# Patient Record
Sex: Male | Born: 1940
Health system: Southern US, Community
[De-identification: ages and names within clinical notes are randomized; demographics above are authoritative.]

## PROBLEM LIST (undated history)

## (undated) DIAGNOSIS — F419 Anxiety disorder, unspecified: Secondary | ICD-10-CM

## (undated) DIAGNOSIS — F32A Depression, unspecified: Secondary | ICD-10-CM

## (undated) DIAGNOSIS — F411 Generalized anxiety disorder: Secondary | ICD-10-CM

## (undated) DIAGNOSIS — F329 Major depressive disorder, single episode, unspecified: Secondary | ICD-10-CM

## (undated) DIAGNOSIS — K2270 Barrett's esophagus without dysplasia: Secondary | ICD-10-CM

## (undated) DIAGNOSIS — T7840XA Allergy, unspecified, initial encounter: Secondary | ICD-10-CM

## (undated) DIAGNOSIS — E291 Testicular hypofunction: Secondary | ICD-10-CM

## (undated) DIAGNOSIS — E785 Hyperlipidemia, unspecified: Secondary | ICD-10-CM

## (undated) DIAGNOSIS — I10 Essential (primary) hypertension: Secondary | ICD-10-CM

## (undated) DIAGNOSIS — D126 Benign neoplasm of colon, unspecified: Secondary | ICD-10-CM

## (undated) DIAGNOSIS — L719 Rosacea, unspecified: Secondary | ICD-10-CM

## (undated) DIAGNOSIS — J309 Allergic rhinitis, unspecified: Secondary | ICD-10-CM

## (undated) DIAGNOSIS — N390 Urinary tract infection, site not specified: Secondary | ICD-10-CM

## (undated) DIAGNOSIS — H269 Unspecified cataract: Secondary | ICD-10-CM

## (undated) DIAGNOSIS — I4891 Unspecified atrial fibrillation: Secondary | ICD-10-CM

## (undated) DIAGNOSIS — N400 Enlarged prostate without lower urinary tract symptoms: Secondary | ICD-10-CM

## (undated) DIAGNOSIS — R3129 Other microscopic hematuria: Secondary | ICD-10-CM

## (undated) DIAGNOSIS — K219 Gastro-esophageal reflux disease without esophagitis: Secondary | ICD-10-CM

## (undated) DIAGNOSIS — C801 Malignant (primary) neoplasm, unspecified: Secondary | ICD-10-CM

## (undated) DIAGNOSIS — G709 Myoneural disorder, unspecified: Secondary | ICD-10-CM

## (undated) DIAGNOSIS — F3289 Other specified depressive episodes: Secondary | ICD-10-CM

## (undated) HISTORY — DX: Other specified depressive episodes: F32.89

## (undated) HISTORY — DX: Unspecified cataract: H26.9

## (undated) HISTORY — PX: POLYPECTOMY: SHX149

## (undated) HISTORY — DX: Benign neoplasm of colon, unspecified: D12.6

## (undated) HISTORY — DX: Other microscopic hematuria: R31.29

## (undated) HISTORY — DX: Barrett's esophagus without dysplasia: K22.70

## (undated) HISTORY — DX: Anxiety disorder, unspecified: F41.9

## (undated) HISTORY — DX: Depression, unspecified: F32.A

## (undated) HISTORY — PX: UPPER GASTROINTESTINAL ENDOSCOPY: SHX188

## (undated) HISTORY — DX: Major depressive disorder, single episode, unspecified: F32.9

## (undated) HISTORY — DX: Gastro-esophageal reflux disease without esophagitis: K21.9

## (undated) HISTORY — DX: Allergic rhinitis, unspecified: J30.9

## (undated) HISTORY — DX: Hyperlipidemia, unspecified: E78.5

## (undated) HISTORY — DX: Testicular hypofunction: E29.1

## (undated) HISTORY — DX: Myoneural disorder, unspecified: G70.9

## (undated) HISTORY — DX: Malignant (primary) neoplasm, unspecified: C80.1

## (undated) HISTORY — PX: HAND SURGERY: SHX662

## (undated) HISTORY — DX: Generalized anxiety disorder: F41.1

## (undated) HISTORY — DX: Rosacea, unspecified: L71.9

## (undated) HISTORY — DX: Allergy, unspecified, initial encounter: T78.40XA

## (undated) HISTORY — PX: MELANOMA EXCISION: SHX5266

## (undated) HISTORY — DX: Urinary tract infection, site not specified: N39.0

## (undated) HISTORY — DX: Unspecified atrial fibrillation: I48.91

## (undated) HISTORY — DX: Benign prostatic hyperplasia without lower urinary tract symptoms: N40.0

## (undated) HISTORY — DX: Essential (primary) hypertension: I10

---

## 1998-12-11 ENCOUNTER — Ambulatory Visit (HOSPITAL_COMMUNITY): Admission: RE | Admit: 1998-12-11 | Discharge: 1998-12-11 | Payer: Self-pay | Admitting: Internal Medicine

## 1998-12-11 ENCOUNTER — Encounter: Payer: Self-pay | Admitting: Internal Medicine

## 1999-07-03 ENCOUNTER — Ambulatory Visit: Admission: RE | Admit: 1999-07-03 | Discharge: 1999-07-03 | Payer: Self-pay | Admitting: Internal Medicine

## 2004-05-06 ENCOUNTER — Ambulatory Visit: Payer: Self-pay | Admitting: Psychology

## 2004-09-04 ENCOUNTER — Ambulatory Visit: Payer: Self-pay | Admitting: Internal Medicine

## 2004-09-09 ENCOUNTER — Ambulatory Visit: Payer: Self-pay | Admitting: Internal Medicine

## 2005-01-30 ENCOUNTER — Ambulatory Visit: Payer: Self-pay | Admitting: Gastroenterology

## 2005-03-06 ENCOUNTER — Ambulatory Visit: Payer: Self-pay | Admitting: Internal Medicine

## 2005-03-12 ENCOUNTER — Ambulatory Visit: Payer: Self-pay | Admitting: Internal Medicine

## 2005-03-20 ENCOUNTER — Ambulatory Visit: Payer: Self-pay | Admitting: Gastroenterology

## 2005-04-03 ENCOUNTER — Encounter (INDEPENDENT_AMBULATORY_CARE_PROVIDER_SITE_OTHER): Payer: Self-pay | Admitting: *Deleted

## 2005-04-03 ENCOUNTER — Ambulatory Visit: Payer: Self-pay | Admitting: Gastroenterology

## 2005-10-27 ENCOUNTER — Ambulatory Visit: Payer: Self-pay | Admitting: Internal Medicine

## 2005-10-30 ENCOUNTER — Ambulatory Visit: Payer: Self-pay | Admitting: Internal Medicine

## 2006-02-23 ENCOUNTER — Ambulatory Visit: Payer: Self-pay | Admitting: Internal Medicine

## 2006-02-26 ENCOUNTER — Ambulatory Visit: Payer: Self-pay | Admitting: Internal Medicine

## 2006-06-03 ENCOUNTER — Ambulatory Visit: Payer: Self-pay | Admitting: Internal Medicine

## 2006-07-07 ENCOUNTER — Ambulatory Visit: Payer: Self-pay | Admitting: Internal Medicine

## 2007-03-30 ENCOUNTER — Encounter: Payer: Self-pay | Admitting: *Deleted

## 2007-03-30 DIAGNOSIS — K2270 Barrett's esophagus without dysplasia: Secondary | ICD-10-CM

## 2007-03-30 DIAGNOSIS — F4323 Adjustment disorder with mixed anxiety and depressed mood: Secondary | ICD-10-CM | POA: Insufficient documentation

## 2007-03-30 DIAGNOSIS — J309 Allergic rhinitis, unspecified: Secondary | ICD-10-CM | POA: Insufficient documentation

## 2007-03-30 DIAGNOSIS — D126 Benign neoplasm of colon, unspecified: Secondary | ICD-10-CM | POA: Insufficient documentation

## 2007-03-30 DIAGNOSIS — L719 Rosacea, unspecified: Secondary | ICD-10-CM

## 2007-03-30 DIAGNOSIS — E291 Testicular hypofunction: Secondary | ICD-10-CM | POA: Insufficient documentation

## 2007-03-30 DIAGNOSIS — K219 Gastro-esophageal reflux disease without esophagitis: Secondary | ICD-10-CM | POA: Insufficient documentation

## 2007-03-30 DIAGNOSIS — E785 Hyperlipidemia, unspecified: Secondary | ICD-10-CM | POA: Insufficient documentation

## 2007-04-19 ENCOUNTER — Ambulatory Visit: Payer: Self-pay | Admitting: Internal Medicine

## 2007-04-19 LAB — CONVERTED CEMR LAB
ALT: 25 units/L (ref 0–53)
AST: 23 units/L (ref 0–37)
Albumin: 3.8 g/dL (ref 3.5–5.2)
Alkaline Phosphatase: 65 units/L (ref 39–117)
BUN: 11 mg/dL (ref 6–23)
Basophils Absolute: 0 10*3/uL (ref 0.0–0.1)
Bilirubin Urine: NEGATIVE
Bilirubin, Direct: 0.1 mg/dL (ref 0.0–0.3)
Calcium: 9.1 mg/dL (ref 8.4–10.5)
Chloride: 108 meq/L (ref 96–112)
Cholesterol: 193 mg/dL (ref 0–200)
Eosinophils Absolute: 0.2 10*3/uL (ref 0.0–0.6)
Eosinophils Relative: 2.3 % (ref 0.0–5.0)
GFR calc Af Amer: 96 mL/min
GFR calc non Af Amer: 80 mL/min
Glucose, Bld: 104 mg/dL — ABNORMAL HIGH (ref 70–99)
Ketones, ur: NEGATIVE mg/dL
LDL Cholesterol: 122 mg/dL — ABNORMAL HIGH (ref 0–99)
Lymphocytes Relative: 26.8 % (ref 12.0–46.0)
MCV: 89.3 fL (ref 78.0–100.0)
Monocytes Relative: 8.8 % (ref 3.0–11.0)
Neutro Abs: 4.4 10*3/uL (ref 1.4–7.7)
Nitrite: NEGATIVE
Platelets: 303 10*3/uL (ref 150–400)
RBC: 4.71 M/uL (ref 4.22–5.81)
Total CHOL/HDL Ratio: 4
Triglycerides: 109 mg/dL (ref 0–149)
Urine Glucose: NEGATIVE mg/dL
WBC: 7.1 10*3/uL (ref 4.5–10.5)

## 2007-04-25 ENCOUNTER — Ambulatory Visit: Payer: Self-pay | Admitting: Internal Medicine

## 2007-04-25 DIAGNOSIS — N400 Enlarged prostate without lower urinary tract symptoms: Secondary | ICD-10-CM | POA: Insufficient documentation

## 2007-04-26 ENCOUNTER — Telehealth: Payer: Self-pay | Admitting: Internal Medicine

## 2007-05-26 DIAGNOSIS — N390 Urinary tract infection, site not specified: Secondary | ICD-10-CM

## 2007-05-26 HISTORY — DX: Urinary tract infection, site not specified: N39.0

## 2007-10-24 ENCOUNTER — Ambulatory Visit: Payer: Self-pay | Admitting: Internal Medicine

## 2007-10-24 DIAGNOSIS — N529 Male erectile dysfunction, unspecified: Secondary | ICD-10-CM | POA: Insufficient documentation

## 2007-11-11 ENCOUNTER — Ambulatory Visit: Payer: Self-pay | Admitting: Internal Medicine

## 2007-11-15 LAB — CONVERTED CEMR LAB
ALT: 22 units/L (ref 0–53)
Bilirubin, Direct: 0.1 mg/dL (ref 0.0–0.3)
CO2: 30 meq/L (ref 19–32)
Calcium: 8.8 mg/dL (ref 8.4–10.5)
Cholesterol: 205 mg/dL (ref 0–200)
Creatinine, Ser: 0.9 mg/dL (ref 0.4–1.5)
Direct LDL: 137.1 mg/dL
GFR calc Af Amer: 109 mL/min
HDL: 53 mg/dL (ref >39.0)
Sodium: 142 meq/L (ref 135–145)
Total Bilirubin: 0.8 mg/dL (ref 0.3–1.2)
Total CHOL/HDL Ratio: 3.9
Total Protein: 6.8 g/dL (ref 6.0–8.3)
Triglycerides: 74 mg/dL (ref 0–149)
VLDL: 15 mg/dL (ref 0–40)

## 2008-03-27 ENCOUNTER — Telehealth: Payer: Self-pay | Admitting: Gastroenterology

## 2008-04-25 ENCOUNTER — Ambulatory Visit: Payer: Self-pay | Admitting: Internal Medicine

## 2008-04-25 DIAGNOSIS — N12 Tubulo-interstitial nephritis, not specified as acute or chronic: Secondary | ICD-10-CM | POA: Insufficient documentation

## 2008-04-30 ENCOUNTER — Ambulatory Visit: Payer: Self-pay | Admitting: Gastroenterology

## 2008-05-21 ENCOUNTER — Telehealth: Payer: Self-pay | Admitting: Internal Medicine

## 2008-06-04 ENCOUNTER — Ambulatory Visit: Payer: Self-pay | Admitting: Gastroenterology

## 2008-06-04 ENCOUNTER — Encounter: Payer: Self-pay | Admitting: Gastroenterology

## 2008-06-07 ENCOUNTER — Encounter: Admission: RE | Admit: 2008-06-07 | Discharge: 2008-06-07 | Payer: Self-pay | Admitting: Internal Medicine

## 2008-06-08 ENCOUNTER — Encounter: Payer: Self-pay | Admitting: Gastroenterology

## 2008-10-18 ENCOUNTER — Ambulatory Visit: Payer: Self-pay | Admitting: Internal Medicine

## 2008-10-18 LAB — CONVERTED CEMR LAB
ALT: 18 units/L (ref 0–53)
AST: 24 units/L (ref 0–37)
Albumin: 3.9 g/dL (ref 3.5–5.2)
Alkaline Phosphatase: 61 units/L (ref 39–117)
Basophils Relative: 0.6 % (ref 0.0–3.0)
CO2: 29 meq/L (ref 19–32)
Calcium: 9.1 mg/dL (ref 8.4–10.5)
Eosinophils Relative: 2.5 % (ref 0.0–5.0)
GFR calc non Af Amer: 79.19 mL/min (ref >60)
HDL: 61.8 mg/dL (ref >39.00)
Hemoglobin, Urine: NEGATIVE
Hemoglobin: 13.3 g/dL (ref 13.0–17.0)
Ketones, ur: NEGATIVE mg/dL
Lymphocytes Relative: 23.3 % (ref 12.0–46.0)
MCHC: 33.7 g/dL (ref 30.0–36.0)
Monocytes Relative: 7.2 % (ref 3.0–12.0)
Neutro Abs: 4.7 10*3/uL (ref 1.4–7.7)
Potassium: 4.9 meq/L (ref 3.5–5.1)
Sodium: 145 meq/L (ref 135–145)
Total Protein, Urine: NEGATIVE mg/dL
Total Protein: 6.6 g/dL (ref 6.0–8.3)
Triglycerides: 120 mg/dL (ref 0.0–149.0)
Urine Glucose: NEGATIVE mg/dL
Urobilinogen, UA: 0.2 (ref 0.0–1.0)
VLDL: 24 mg/dL (ref 0.0–40.0)

## 2008-10-23 ENCOUNTER — Ambulatory Visit: Payer: Self-pay | Admitting: Internal Medicine

## 2009-01-03 ENCOUNTER — Ambulatory Visit: Payer: Self-pay | Admitting: Internal Medicine

## 2009-03-19 ENCOUNTER — Ambulatory Visit: Payer: Self-pay | Admitting: Internal Medicine

## 2009-03-19 DIAGNOSIS — Z87891 Personal history of nicotine dependence: Secondary | ICD-10-CM | POA: Insufficient documentation

## 2009-03-21 ENCOUNTER — Ambulatory Visit: Payer: Self-pay | Admitting: Internal Medicine

## 2009-03-22 LAB — CONVERTED CEMR LAB
ALT: 21 units/L (ref 0–53)
AST: 21 units/L (ref 0–37)
Albumin: 4 g/dL (ref 3.5–5.2)
Alkaline Phosphatase: 61 units/L (ref 39–117)
BUN: 12 mg/dL (ref 6–23)
Chloride: 103 meq/L (ref 96–112)
Cholesterol: 216 mg/dL — ABNORMAL HIGH (ref 0–200)
Glucose, Bld: 94 mg/dL (ref 70–99)
Potassium: 4 meq/L (ref 3.5–5.1)
Sodium: 136 meq/L (ref 135–145)
Total Bilirubin: 0.9 mg/dL (ref 0.3–1.2)
Total CHOL/HDL Ratio: 4
VLDL: 61.2 mg/dL — ABNORMAL HIGH (ref 0.0–40.0)

## 2009-03-26 LAB — CONVERTED CEMR LAB: Vit D, 25-Hydroxy: 28 ng/mL — ABNORMAL LOW (ref 30–89)

## 2009-05-25 HISTORY — PX: PROSTATE BIOPSY: SHX241

## 2009-05-31 ENCOUNTER — Encounter: Payer: Self-pay | Admitting: Internal Medicine

## 2009-06-19 ENCOUNTER — Ambulatory Visit: Payer: Self-pay | Admitting: Internal Medicine

## 2009-06-19 DIAGNOSIS — D485 Neoplasm of uncertain behavior of skin: Secondary | ICD-10-CM

## 2009-07-10 ENCOUNTER — Telehealth: Payer: Self-pay | Admitting: Internal Medicine

## 2009-07-22 ENCOUNTER — Ambulatory Visit: Payer: Self-pay | Admitting: Internal Medicine

## 2009-07-22 DIAGNOSIS — K5909 Other constipation: Secondary | ICD-10-CM

## 2009-11-06 ENCOUNTER — Encounter: Payer: Self-pay | Admitting: Internal Medicine

## 2009-12-09 ENCOUNTER — Encounter: Payer: Self-pay | Admitting: Internal Medicine

## 2010-02-06 ENCOUNTER — Encounter (INDEPENDENT_AMBULATORY_CARE_PROVIDER_SITE_OTHER): Payer: Self-pay | Admitting: *Deleted

## 2010-02-11 ENCOUNTER — Encounter (INDEPENDENT_AMBULATORY_CARE_PROVIDER_SITE_OTHER): Payer: Self-pay | Admitting: *Deleted

## 2010-02-13 ENCOUNTER — Ambulatory Visit: Payer: Self-pay | Admitting: Internal Medicine

## 2010-02-13 LAB — CONVERTED CEMR LAB
Alkaline Phosphatase: 66 units/L (ref 39–117)
BUN: 15 mg/dL (ref 6–23)
Basophils Absolute: 0 10*3/uL (ref 0.0–0.1)
Basophils Relative: 0 % (ref 0.0–3.0)
Bilirubin, Direct: 0.1 mg/dL (ref 0.0–0.3)
CO2: 27 meq/L (ref 19–32)
Calcium: 9 mg/dL (ref 8.4–10.5)
Chloride: 101 meq/L (ref 96–112)
Cholesterol: 237 mg/dL — ABNORMAL HIGH (ref 0–200)
Creatinine, Ser: 0.8 mg/dL (ref 0.4–1.5)
Direct LDL: 163.9 mg/dL
Eosinophils Absolute: 0.2 10*3/uL (ref 0.0–0.7)
Glucose, Bld: 89 mg/dL (ref 70–99)
Hemoglobin, Urine: NEGATIVE
Lymphocytes Relative: 24.2 % (ref 12.0–46.0)
MCHC: 34.8 g/dL (ref 30.0–36.0)
MCV: 89.4 fL (ref 78.0–100.0)
Monocytes Absolute: 0.6 10*3/uL (ref 0.1–1.0)
Neutrophils Relative %: 65.5 % (ref 43.0–77.0)
Nitrite: NEGATIVE
PSA: 3.21 ng/mL (ref 0.10–4.00)
Platelets: 248 10*3/uL (ref 150.0–400.0)
RBC: 4.77 M/uL (ref 4.22–5.81)
RDW: 13.4 % (ref 11.5–14.6)
Specific Gravity, Urine: 1.015 (ref 1.000–1.030)
Total Bilirubin: 0.8 mg/dL (ref 0.3–1.2)
Total CHOL/HDL Ratio: 4
Total Protein, Urine: NEGATIVE mg/dL
Total Protein: 6.7 g/dL (ref 6.0–8.3)
Triglycerides: 153 mg/dL — ABNORMAL HIGH (ref 0.0–149.0)
Urine Glucose: NEGATIVE mg/dL

## 2010-02-19 ENCOUNTER — Ambulatory Visit: Payer: Self-pay | Admitting: Internal Medicine

## 2010-02-24 ENCOUNTER — Encounter (INDEPENDENT_AMBULATORY_CARE_PROVIDER_SITE_OTHER): Payer: Self-pay | Admitting: *Deleted

## 2010-02-26 ENCOUNTER — Ambulatory Visit: Payer: Self-pay | Admitting: Gastroenterology

## 2010-02-26 ENCOUNTER — Encounter (INDEPENDENT_AMBULATORY_CARE_PROVIDER_SITE_OTHER): Payer: Self-pay | Admitting: *Deleted

## 2010-03-10 ENCOUNTER — Telehealth: Payer: Self-pay | Admitting: Gastroenterology

## 2010-03-10 ENCOUNTER — Ambulatory Visit: Payer: Self-pay | Admitting: Gastroenterology

## 2010-04-22 ENCOUNTER — Ambulatory Visit: Payer: Self-pay | Admitting: Gastroenterology

## 2010-04-22 HISTORY — PX: COLONOSCOPY: SHX174

## 2010-04-24 ENCOUNTER — Encounter: Payer: Self-pay | Admitting: Gastroenterology

## 2010-06-18 ENCOUNTER — Ambulatory Visit
Admission: RE | Admit: 2010-06-18 | Discharge: 2010-06-18 | Payer: Self-pay | Source: Home / Self Care | Attending: Internal Medicine | Admitting: Internal Medicine

## 2010-06-18 ENCOUNTER — Other Ambulatory Visit: Payer: Self-pay | Admitting: Internal Medicine

## 2010-06-18 LAB — BASIC METABOLIC PANEL
BUN: 15 mg/dL (ref 6–23)
CO2: 29 mEq/L (ref 19–32)
Calcium: 9.3 mg/dL (ref 8.4–10.5)
Chloride: 108 mEq/L (ref 96–112)
Creatinine, Ser: 0.8 mg/dL (ref 0.4–1.5)
GFR: 96.36 mL/min (ref >60.00)
Glucose, Bld: 93 mg/dL (ref 70–99)
Potassium: 4.8 mEq/L (ref 3.5–5.1)
Sodium: 143 mEq/L (ref 135–145)

## 2010-06-18 LAB — HEPATIC FUNCTION PANEL
ALT: 26 U/L (ref 0–53)
AST: 21 U/L (ref 0–37)
Albumin: 4 g/dL (ref 3.5–5.2)
Alkaline Phosphatase: 75 U/L (ref 39–117)
Bilirubin, Direct: 0.1 mg/dL (ref 0.0–0.3)
Total Bilirubin: 0.9 mg/dL (ref 0.3–1.2)
Total Protein: 6.8 g/dL (ref 6.0–8.3)

## 2010-06-18 LAB — LIPID PANEL
Cholesterol: 203 mg/dL — ABNORMAL HIGH (ref 0–200)
HDL: 59.1 mg/dL (ref >39.00)
Total CHOL/HDL Ratio: 3
Triglycerides: 192 mg/dL — ABNORMAL HIGH (ref 0.0–149.0)
VLDL: 38.4 mg/dL (ref 0.0–40.0)

## 2010-06-18 LAB — LDL CHOLESTEROL, DIRECT: Direct LDL: 127.8 mg/dL

## 2010-06-23 ENCOUNTER — Ambulatory Visit
Admission: RE | Admit: 2010-06-23 | Discharge: 2010-06-23 | Payer: Self-pay | Source: Home / Self Care | Attending: Internal Medicine | Admitting: Internal Medicine

## 2010-06-23 DIAGNOSIS — R5383 Other fatigue: Secondary | ICD-10-CM

## 2010-06-23 DIAGNOSIS — R5381 Other malaise: Secondary | ICD-10-CM | POA: Insufficient documentation

## 2010-06-24 NOTE — Assessment & Plan Note (Signed)
Summary: PT HAS MOLE THAT HE WANTS TO BE LOOKED AT/AJ   Vital Signs:  Patient profile:   70 year old male Weight:      206 pounds Temp:     98.5 degrees F oral Pulse rate:   52 / minute BP sitting:   122 / 64  (left arm)  Vitals Entered By: Tora Perches (June 19, 2009 4:28 PM)  Procedure Note  Mole Biopsy/Removal: The patient complains of changing mole. Onset of lesion: 4 weeks Indication: changing lesion Consent signed: yes  Procedure # 1: shave biopsy    Size (in cm): 1.1 x 1.0    Region: medial    Location: upper anterior chest    Comment: Risks including but not limited by incomplete procedure, bleeding, infection, recurrence were discussed with the patient. Consent form was signed.     Instrument used: dermablade    Anesthesia: 1.0 ml 1% lidocaine w/epinephrine  Cleaned and prepped with: alcohol and betadine Wound dressing: neosporin and bandaid Instructions: daily dressing changes Additional Instructions: Tolerated well. Complicatons - none.   CC: mole in question Is Patient Diabetic? No   CC:  mole in question.  History of Present Illness: C/o mole on chest that changed lately  Preventive Screening-Counseling & Management  Alcohol-Tobacco     Smoking Status: quit < 6 months  Current Medications (verified): 1)  Lovastatin 40 Mg  Tabs (Lovastatin) .... Take 2 Tablets Daily 2)  Aspirin 81 Mg  Tabs (Aspirin) .... Take One Tablet Once Daily 3)  Vitamin D3 1000 Unit  Tabs (Cholecalciferol) 4)  Levitra 20 Mg Tabs (Vardenafil Hcl) .... 1/2-1 Once Daily As Needed 5)  Sertraline Hcl 100 Mg Tabs (Sertraline Hcl) .Marland Kitchen.. 11/2 Once Daily 6)  Abilify 10 Mg Tabs (Aripiprazole) .... Once Daily 7)  Clorazepate Dipotassium 7.5 Mg Tabs (Clorazepate Dipotassium) .... Once Daily 8)  Omeprazole 20 Mg Cpdr (Omeprazole) .... Two By Mouth Daily  Allergies: 1)  ! Vioxx  Social History: Smoking Status:  quit < 6 months  Physical Exam  General:   Well-developed,well-nourished,in no acute distress; alert,appropriate and cooperative throughout examination Skin:  11x10 mm irregular mole on chest   Impression & Recommendations:  Problem # 1:  NEOPLASM OF UNCERTAIN BEHAVIOR OF SKIN (ICD-238.2) upper anter chest Assessment New  Options discussed. He elected biopsy now.  Orders: Shave Skin Lesion 1.1-2.0 cm/trunk/arm/leg ([address])  Complete Medication List: 1)  Lovastatin 40 Mg Tabs (Lovastatin) .... Take 2 tablets daily 2)  Aspirin 81 Mg Tabs (Aspirin) .... Take one tablet once daily 3)  Vitamin D3 1000 Unit Tabs (Cholecalciferol) 4)  Levitra 20 Mg Tabs (Vardenafil hcl) .... 1/2-1 once daily as needed 5)  Sertraline Hcl 100 Mg Tabs (Sertraline hcl) .Marland Kitchen.. 11/2 once daily 6)  Abilify 10 Mg Tabs (Aripiprazole) .... Once daily 7)  Clorazepate Dipotassium 7.5 Mg Tabs (Clorazepate dipotassium) .... Once daily 8)  Omeprazole 20 Mg Cpdr (Omeprazole) .... Two by mouth daily  Patient Instructions: 1)  Keep return office visit

## 2010-06-24 NOTE — Letter (Signed)
Summary: Patient Notice-Barrett's [facility]  [address] [place], [place] [address]   Phone: [phone]  Fax: [phone]        April 24, 2010 MRN: [id number]    [facility] [address] [place], [place]  [address]    Dear Mr. Spengler,  I am pleased to inform you that the biopsies taken during your recent endoscopic examination did not show any evidence of cancer upon pathologic examination.  However, your biopsies indicate you have a condition known as Barrett's esophagus. While not cancer, it is pre-cancerous (can progress to cancer) and needs to be monitored with repeat endoscopic examination and biopsies.  Fortunately, it is quite rare that this develops into cancer, but careful monitoring of the condition along with taking your medication as prescribed is important in reducing the risk of developing cancer.  The stomach biopsies showed mild chronic gastritis.  It is my recommendation that you have a repeat upper gastrointestinal endoscopic examination in 2 years.  Continue with treatment plan as outlined the day of your exam.  Please call us if you have or develop heartburn, reflux symptoms, any swallowing problems, or if you have questions about your condition that have not been fully answered at this time.  Sincerely,  Meryl Dare MD [facility]  This letter has been electronically signed by your physician.  Appended Document: Patient Notice-Barrett's Esopghagus Letter mailed

## 2010-06-24 NOTE — Letter (Signed)
Summary: [facility]   Imported By: Sherian Rein 12/02/2009 14:18:26  _____________________________________________________________________  External Attachment:    Type:   Image     Comment:   External Document

## 2010-06-24 NOTE — Procedures (Signed)
Summary: Colonoscopy  Patient: Oscar [name] Note: All result statuses are Final unless otherwise noted.  Tests: (1) Colonoscopy (COL)   COL Colonoscopy           DONE     [facility]     [address]     [place], [place]  [address]           COLONOSCOPY PROCEDURE REPORT     PATIENT:  Oscar [name]  MR#:  [id number]     BIRTHDATE:  1940/09/24, 69 yrs. old  GENDER:  male     ENDOSCOPIST:  Judie Petit T. Russella Dar, MD, [facility]           PROCEDURE DATE:  04/22/2010     PROCEDURE:  Colonoscopy 19[address]     ASA CLASS:  Class II     INDICATIONS:  1) surveillance and high-risk screening 2) history     of adenomatous colon polyps: 1998 3) family history of colon     cancer: mother with colon cancer at 61.     MEDICATIONS:   Fentanyl 75 mcg IV, Versed 8 mg IV     DESCRIPTION OF PROCEDURE:   After the risks benefits and     alternatives of the procedure were thoroughly explained, informed     consent was obtained.  Digital rectal exam was performed and     revealed no abnormalities.   The LB [id number]-[id number] endoscope     was introduced through the anus and advanced to the cecum, which     was identified by both the appendix and ileocecal valve, without     limitations.  The quality of the prep was good, using MoviPrep.     The instrument was then slowly withdrawn as the colon was fully     examined.     <<PROCEDUREIMAGES>>     FINDINGS:  Mild diverticulosis was found in the sigmoid colon. A     normal appearing cecum, ileocecal valve, and appendiceal orifice     were identified. The ascending, hepatic flexure, transverse,     splenic flexure, descending colon, and rectum appeared     unremarkable.  Retroflexed views in the rectum revealed internal     hemorrhoids, moderate.  The time to cecum =  4.5  minutes. The     scope was then withdrawn (time =  9.67  min) from the patient and     the procedure completed.     COMPLICATIONS:  None           ENDOSCOPIC IMPRESSION:     1) Mild  diverticulosis in the sigmoid colon     2) Internal hemorrhoids     RECOMMENDATIONS:     1) High fiber diet with liberal fluid intake.     2) Repeat Colonoscopy in 5 years.           Venita Lick. Russella Dar, MD, [facility]           n.     eSIGNED:   Venita Lick. Stark at 04/22/2010 11:33 AM           [name], [phone]  Note: An exclamation mark (!) indicates a result that was not dispersed into the flowsheet. Document Creation Date: 04/22/2010 11:34 AM _______________________________________________________________________  (1) Order result status: Final Collection or observation date-time: 04/22/2010 11:28 Requested date-time:  Receipt date-time:  Reported date-time:  Referring Physician:   Ordering Physician: Claudette Head [phone]) Specimen Source:  Source: Launa Grill Order Number: [id number] Lab  site:   Appended Document: Colonoscopy    Clinical Lists Changes  Observations: Added new observation of COLONNXTDUE: 03/2015 (04/22/2010 13:47)

## 2010-06-24 NOTE — Miscellaneous (Signed)
Summary: Skin Bx/[facility]  Skin Bx/[facility]   Imported By: Sherian Rein 06/22/2009 11:39:29  _____________________________________________________________________  External Attachment:    Type:   Image     Comment:   External Document

## 2010-06-24 NOTE — Assessment & Plan Note (Signed)
Summary: 3-4 MO ROV/NWS   Vital Signs:  Patient profile:   70 year old male Weight:      204 pounds BMI:     27.01 Temp:     98.7 degrees F oral Pulse rate:   52 / minute BP sitting:   122 / 70  (left arm)  Vitals Entered By: Tora Perches (July 22, 2009 10:05 AM) CC: f/u Is Patient Diabetic? No   CC:  f/u.  History of Present Illness: C/o bad case of constipation that we treated w/Nulytely  Preventive Screening-Counseling & Management  Alcohol-Tobacco     Smoking Status: quit  Current Medications (verified): 1)  Lovastatin 40 Mg  Tabs (Lovastatin) .... Take 2 Tablets Daily 2)  Aspirin 81 Mg  Tabs (Aspirin) .... Take One Tablet Once Daily 3)  Vitamin D3 1000 Unit  Tabs (Cholecalciferol) 4)  Levitra 20 Mg Tabs (Vardenafil Hcl) .... 1/2-1 Once Daily As Needed 5)  Sertraline Hcl 100 Mg Tabs (Sertraline Hcl) .Marland Kitchen.. 11/2 Once Daily 6)  Abilify 10 Mg Tabs (Aripiprazole) .... Once Daily 7)  Omeprazole 20 Mg Cpdr (Omeprazole) .... Two By Mouth Daily 8)  Nulytely With Flavor Packs 420 Gm Solr (Peg 3350-Kcl-Na Bicarb-Nacl) .... As Dirr For Colon Prep  Allergies: 1)  ! Vioxx  Past History:  Past Medical History: Last updated: 01/03/2009 HYPOGONADISM (ICD-257.2) ANXIETY (ICD-300.00) Hx of ROSACEA (ICD-695.3) ALLERGIC RHINITIS (ICD-477.9) BARRETT'S ESOPHAGUS, HX OF (ICD-V12.79)   Dr Russella Dar GASTROESOPHAGEAL REFLUX DISEASE (ICD-530.81) ADENOMATOUS COLONIC POLYP (ICD-211.3) DYSLIPIDEMIA (ICD-272.4) DEPRESSION (ICD-311)        Dr Evelene Croon UTI 2009   Benign prostatic hypertrophy  s/p Bx 2007 MACROHEMATURIA DR [facility] Depression Dr Evelene Croon 2010 GERD  Social History: Last updated: 01/03/2009 Occupation: [employer] - retired 2010 in june Married - sepparated 2010 Alcohol use-no since 2006, restarted 2010, visiting AA Former Smoker 70 yo GD was admitted  Social History: Smoking Status:  quit  Review of Systems  The patient denies fever, weight loss, and abdominal pain.     Physical Exam  General:  Well-developed,well-nourished,in no acute distress; alert,appropriate and cooperative throughout examination Mouth:  Oral mucosa and oropharynx without lesions or exudates.  Teeth in good repair. Lungs:  Normal respiratory effort, chest expands symmetrically. Lungs are clear to auscultation, no crackles or wheezes. Heart:  Normal rate and regular rhythm. S1 and S2 normal without gallop, murmur, click, rub or other extra sounds. Abdomen:  Bowel sounds positive,abdomen soft and non-tender without masses, organomegaly or hernias noted. Skin:  WNL Psych:  Oriented X3, memory intact for recent and remote, normally interactive, good eye contact, and depressed affect.  Not suicidal   Impression & Recommendations:  Problem # 1:  CONSTIPATION, CHRONIC (ICD-564.09) Assessment New Amitiza prn His updated medication list for this problem includes:    Nulytely With Flavor Packs 420 Gm Solr (Peg 3350-kcl-na bicarb-nacl) .Marland Kitchen... As dirr for colon prep Colon up to date  Problem # 2:  NEOPLASM OF UNCERTAIN BEHAVIOR OF SKIN (ICD-238.2) removed  Complete Medication List: 1)  Lovastatin 40 Mg Tabs (Lovastatin) .... Take 2 tablets daily 2)  Aspirin 81 Mg Tabs (Aspirin) .... Take one tablet once daily 3)  Vitamin D3 1000 Unit Tabs (Cholecalciferol) 4)  Levitra 20 Mg Tabs (Vardenafil hcl) .... 1/2-1 once daily as needed 5)  Sertraline Hcl 100 Mg Tabs (Sertraline hcl) .Marland Kitchen.. 11/2 once daily 6)  Abilify 10 Mg Tabs (Aripiprazole) .... Once daily 7)  Omeprazole 20 Mg Cpdr (Omeprazole) .... Two by mouth daily 8)  Nulytely With Flavor Packs 420 Gm Solr (Peg 3350-kcl-na bicarb-nacl) .... As dirr for colon prep 9)  Amitiza 24 Mcg Caps (Lubiprostone) .Marland Kitchen.. 1 by mouth once daily as needed constipation  Other Orders: Pneumococcal Vaccine ([address]) Admin 1st Vaccine ([address]25) Admin 1st Vaccine [facility]) [phone])  Patient Instructions: 1)  Please schedule a follow-up appointment in 3 months. 2)   BMP prior to visit, ICD-9: 3)  TSH prior to visit, ICD-9:401.1 Prescriptions: AMITIZA 24 MCG CAPS (LUBIPROSTONE) 1 by mouth once daily as needed constipation  #30 x 6   Entered and Authorized by:   Tresa Garter MD   Signed by:   Tresa Garter MD on 07/22/2009   Method used:   Print then Give to Patient   RxID:   [id number]    Pneumovax Vaccine    Vaccine Type: Pneumovax    Site: left deltoid    Mfr: Merck    Dose: 0.5 ml    Route: IM    Given by: Tora Perches    Exp. Date: 09/12/2010    Lot #: [id number]    VIS given: 12/21/95 version given July 22, 2009.

## 2010-06-24 NOTE — Letter (Signed)
Summary: Colonoscopy Letter  [facility]  [address] [place], [place] [address]   Phone: [phone]  Fax: [phone]      February 06, 2010 MRN: [id number]   [facility] [address] [place], [place]  [address]   Dear Mr. [facility],   According to your medical record, it is time for you to schedule a Colonoscopy. The American Cancer Society recommends this procedure as a method to detect early colon cancer. Patients with a family history of colon cancer, or a personal history of colon polyps or inflammatory bowel disease are at increased risk.  This letter has beeen generated based on the recommendations made at the time of your procedure. If you feel that in your particular situation this may no longer apply, please contact our office.  Please call our office at [phone] to schedule this appointment or to update your records at your earliest convenience.  Thank you for cooperating with [place] to provide you with the very best care possible.   Sincerely,  Judie Petit T. Russella Dar, M.D.  [facility] [phone]

## 2010-06-24 NOTE — Letter (Signed)
Summary: [facility] Instructions  [facility]  [address] [place], [place] [address]   Phone: [phone]  Fax: [phone]       Oscar [name]    [birth year]    MRN: [id number]        Procedure Day Dorna Bloom:  Wednesday 03/12/2010     Arrival Time:  8:30 am      Procedure Time: 9:30 am     Location of Procedure:                    _x _  [facility] (4th Floor)                        PREPARATION FOR COLONOSCOPY WITH MOVIPREP   Starting 5 days prior to your procedure Friday 10/14 do not eat nuts, seeds, popcorn, corn, beans, peas,  salads, or any raw vegetables.  Do not take any fiber supplements (e.g. Metamucil, Citrucel, and Benefiber).  THE DAY BEFORE YOUR PROCEDURE         DATE: Tuesday 10/18  1.  Drink clear liquids the entire day-NO SOLID FOOD  2.  Do not drink anything colored red or purple.  Avoid juices with pulp.  No orange juice.  3.  Drink at least 64 oz. (8 glasses) of fluid/clear liquids during the day to prevent dehydration and help the prep work efficiently.  CLEAR LIQUIDS INCLUDE: Water Jello Ice Popsicles Tea (sugar ok, no milk/cream) Powdered fruit flavored drinks Coffee (sugar ok, no milk/cream) Gatorade Juice: apple, white grape, white cranberry  Lemonade Clear bullion, consomm, broth Carbonated beverages (any kind) Strained chicken noodle soup Hard Candy                             4.  In the morning, mix first dose of MoviPrep solution:    Empty 1 Pouch A and 1 Pouch B into the disposable container    Add lukewarm drinking water to the top line of the container. Mix to dissolve    Refrigerate (mixed solution should be used within 24 hrs)  5.  Begin drinking the prep at 5:00 p.m. The MoviPrep container is divided by 4 marks.   Every 15 minutes drink the solution down to the next mark (approximately 8 oz) until the full liter is complete.   6.  Follow completed prep with 16 oz of clear liquid of your choice  (Nothing red or purple).  Continue to drink clear liquids until bedtime.  7.  Before going to bed, mix second dose of MoviPrep solution:    Empty 1 Pouch A and 1 Pouch B into the disposable container    Add lukewarm drinking water to the top line of the container. Mix to dissolve    Refrigerate  THE DAY OF YOUR PROCEDURE      DATE: Wednesday 10/19   Beginning at 4:30 a.m. (5 hours before procedure):         1. Every 15 minutes, drink the solution down to the next mark (approx 8 oz) until the full liter is complete.  2. Follow completed prep with 16 oz. of clear liquid of your choice.    3. You may drink clear liquids until 7:30 am (2 HOURS BEFORE PROCEDURE).   MEDICATION INSTRUCTIONS  Unless otherwise instructed, you should take regular prescription medications with a small sip of water   as early as possible the  morning of your procedure.    Additional medication instructions: n/a         OTHER INSTRUCTIONS  You will need a responsible adult at least 69 years of age to accompany you and drive you home.   This person must remain in the waiting room during your procedure.  Wear loose fitting clothing that is easily removed.  Leave jewelry and other valuables at home.  However, you may wish to bring a book to read or  an iPod/MP3 player to listen to music as you wait for your procedure to start.  Remove all body piercing jewelry and leave at home.  Total time from sign-in until discharge is approximately 2-3 hours.  You should go home directly after your procedure and rest.  You can resume normal activities the  day after your procedure.  The day of your procedure you should not:   Drive   Make legal decisions   Operate machinery   Drink alcohol   Return to work  You will receive specific instructions about eating, activities and medications before you leave.    The above instructions have been reviewed and explained to me by  Sherren Kerns RN  February 26, 2010 4:49 PM    I fully understand and can verbalize these instructions _____________________________ Date _________

## 2010-06-24 NOTE — Miscellaneous (Signed)
Summary: previsit prep/rm  Clinical Lists Changes  Medications: Added new medication of MOVIPREP 100 GM  SOLR (PEG-KCL-NACL-NASULF-NA ASC-C) As per prep instructions. - Signed Rx of MOVIPREP 100 GM  SOLR (PEG-KCL-NACL-NASULF-NA ASC-C) As per prep instructions.;  #1 x 0;  Signed;  Entered by: Sherren Kerns RN;  Authorized by: Meryl Dare MD [facility];  Method used: Electronically to [id number]*, [address] [place], [place], [place]  [address], Ph: [phone], Fax: [phone] Allergies: Added new allergy or adverse reaction of CELEBREX Added new allergy or adverse reaction of PENICILLIN Observations: Added new observation of ALLERGY REV: Done (02/26/2010 16:13)    Prescriptions: MOVIPREP 100 GM  SOLR (PEG-KCL-NACL-NASULF-NA ASC-C) As per prep instructions.  #1 x 0   Entered by:   Sherren Kerns RN   Authorized by:   Meryl Dare MD [facility]   Signed by:   Sherren Kerns RN on 02/26/2010   Method used:   Electronically to        [facility] [phone]* (retail)       [phone]       [place], [place]  [address]       Ph: [phone]       Fax: [phone]   RxID:   [id number]

## 2010-06-24 NOTE — Letter (Signed)
Summary: Pre Visit Letter Revised  [facility]  [address] [place], [place] [address]   Phone: [phone]  Fax: [phone]        02/11/2010 MRN: [id number] [facility] [address] [place], [place]  [address]             Procedure Date:  03-12-10   Welcome to the Gastroenterology Division at [facility].    You are scheduled to see a nurse for your pre-procedure visit on 02-26-10 at 4:30P.M. on the 3rd floor at [facility], [address]  We ask that you try to arrive at our office 15 minutes prior to your appointment time to allow for check-in.  Please take a minute to review the attached form.  If you answer "Yes" to one or more of the questions on the first page, we ask that you call the person listed at your earliest opportunity.  If you answer "No" to all of the questions, please complete the rest of the form and bring it to your appointment.    Your nurse visit will consist of discussing your medical and surgical history, your immediate family medical history, and your medications.   If you are unable to list all of your medications on the form, please bring the medication bottles to your appointment and we will list them.  We will need to be aware of both prescribed and over the counter drugs.  We will need to know exact dosage information as well.    Please be prepared to read and sign documents such as consent forms, a financial agreement, and acknowledgement forms.  If necessary, and with your consent, a friend or relative is welcome to sit-in on the nurse visit with you.  Please bring your insurance card so that we may make a copy of it.  If your insurance requires a referral to see a specialist, please bring your referral form from your primary care physician.  No co-pay is required for this nurse visit.     If you cannot keep your appointment, please call [phone] to cancel or reschedule prior to your appointment date.  This  allows [place] the opportunity to schedule an appointment for another patient in need of care.    Thank you for choosing [facility] for your medical needs.  We appreciate the opportunity to care for you.  Please visit [place] at our website  to learn more about our practice.  Sincerely, The Gastroenterology Division

## 2010-06-24 NOTE — Assessment & Plan Note (Signed)
Summary: CPX/ SECURE HORIZIONS /NWS  #   Vital Signs:  Patient profile:   70 year old male Height:      73 inches Weight:      210 pounds BMI:     27.81 Temp:     98.6 degrees F oral Pulse rate:   68 / minute Pulse rhythm:   regular Resp:     16 per minute BP sitting:   118 / 68  (left arm) Cuff size:   regular  Vitals Entered By: Lanier Prude, CMA(AAMA) (February 19, 2010 10:40 AM) CC: MWV Is Patient Diabetic? No Comments pt had EKG 2 mo ago at [facility].  He will get copy.   CC:  MWV.  History of Present Illness: The patient presents for a preventive health examination  Patient past medical history, social history, and family history reviewed in detail no significant changes.  Patient is physically active. Depression is negative and mood is good. Hearing is normal, and able to perform activities of daily living. Risk of falling is negligible and home safety has been reviewed and is appropriate. Patient has normal height, overweight some , and visual acuity is nl w/reading glasses. Patient has been counseled on age-appropriate routine health concerns for screening and prevention. Education, counseling done. Decreased hearing.  Preventive Screening-Counseling & Management  Alcohol-Tobacco     Alcohol drinks/day: 0     Smoking Status: quit > 6 months  Caffeine-Diet-Exercise     Caffeine Counseling: not indicated; caffeine use is not excessive or problematic     Diet Counseling: to improve diet; diet is suboptimal     Does Patient Exercise: yes     Type of exercise: gym     Depression Counseling: further diagnostic testing and/or other treatment is indicated  Hep-HIV-STD-Contraception     Hepatitis Risk: no risk noted     Sun Exposure-Excessive: no  Safety-Violence-Falls     Seat Belt Use: yes     Fall Risk Counseling: not indicated; no significant falls noted  Current Medications (verified): 1)  Lovastatin 40 Mg  Tabs (Lovastatin) .... Take 2 Tablets Daily 2)  Aspirin  81 Mg  Tabs (Aspirin) .... Take One Tablet Once Daily 3)  Vitamin D3 1000 Unit  Tabs (Cholecalciferol) 4)  Levitra 20 Mg Tabs (Vardenafil Hcl) .... 1/2-1 Once Daily As Needed 5)  Sertraline Hcl 100 Mg Tabs (Sertraline Hcl) .Marland Kitchen.. 11/2 Once Daily 6)  Abilify 10 Mg Tabs (Aripiprazole) .... Once Daily 7)  Omeprazole 20 Mg Cpdr (Omeprazole) .... Two By Mouth Daily 8)  Nulytely With Flavor Packs 420 Gm Solr (Peg 3350-Kcl-Na Bicarb-Nacl) .... As Dirr For Colon Prep 9)  Amitiza 24 Mcg Caps (Lubiprostone) .Marland Kitchen.. 1 By Mouth Once Daily As Needed Constipation  Allergies (verified): 1)  ! Vioxx  Past History:  Family History: Last updated: 04/25/2007 Family History of CAD Male 1st degree relative <50  Social History: Last updated: 01/03/2009 Occupation: [employer] - retired 2010 in june Married - sepparated 2010 Alcohol use-no since 2006, restarted 2010, visiting AA Former Smoker 70 yo GD was admitted  Past Medical History: HYPOGONADISM (ICD-257.2) ANXIETY (ICD-300.00) Hx of ROSACEA (ICD-695.3) ALLERGIC RHINITIS (ICD-477.9) BARRETT'S ESOPHAGUS, HX OF (ICD-V12.79)   Dr Russella Dar GASTROESOPHAGEAL REFLUX DISEASE (ICD-530.81) ADENOMATOUS COLONIC POLYP (ICD-211.3) DYSLIPIDEMIA (ICD-272.4) DEPRESSION (ICD-311)        Dr Evelene Croon UTI 2009  Benign prostatic hypertrophy  s/p Bx 2007 MACROHEMATURIA DR [facility] Depression Dr Evelene Croon 2010 GERD  Past Surgical History: Prostate biopsy Dr Serena Colonel 2011 (BPH)  Social History: Smoking Status:  quit > 6 months Does Patient Exercise:  yes Hepatitis Risk:  no risk noted Seat Belt Use:  yes Sun Exposure-Excessive:  no  Review of Systems       stress at home  Physical Exam  General:  Well-developed,well-nourished,in no acute distress; alert,appropriate and cooperative throughout examination Nose:  External nasal examination shows no deformity or inflammation. Nasal mucosa are pink and moist without lesions or exudates. Abdomen:  Bowel sounds positive,abdomen  soft and non-tender without masses, organomegaly or hernias noted. Rectal:  per Urol Genitalia:  per Urol Prostate:  per Urol Msk:  No deformity or scoliosis noted of thoracic or lumbar spine.   Pulses:  R and L carotid,radial,femoral,dorsalis pedis and posterior tibial pulses are full and equal bilaterally Extremities:  No clubbing, cyanosis, edema, or deformity noted with normal full range of motion of all joints.   Neurologic:  No cranial nerve deficits noted. Station and gait are normal. Plantar reflexes are down-going bilaterally. DTRs are symmetrical throughout. Sensory, motor and coordinative functions appear intact. Skin:  WNL Cervical Nodes:  No lymphadenopathy noted Inguinal Nodes:  No significant adenopathy Psych:  Oriented X3, memory intact for recent and remote, normally interactive, good eye contact, and depressed affect.  Not suicidal   Impression & Recommendations:  Problem # 1:  WELL ADULT EXAM (ICD-V70.0) Health and age related issues were discussed. Available screening tests and vaccinations were discussed as well. Healthy life style including good diet and exercise was discussed. EKG at [facility]  Complete Medication List: 1)  Aspirin 81 Mg Tabs (Aspirin) .... Take one tablet once daily 2)  Vitamin D3 1000 Unit Tabs (Cholecalciferol) 3)  Levitra 20 Mg Tabs (Vardenafil hcl) .... 1/2-1 once daily as needed 4)  Sertraline Hcl 100 Mg Tabs (Sertraline hcl) .Marland Kitchen.. 11/2 once daily 5)  Abilify 10 Mg Tabs (Aripiprazole) .... Once daily 6)  Omeprazole 20 Mg Cpdr (Omeprazole) .... Two by mouth daily 7)  Nulytely With Flavor Packs 420 Gm Solr (Peg 3350-kcl-na bicarb-nacl) .... As dirr for colon prep 8)  Amitiza 24 Mcg Caps (Lubiprostone) .Marland Kitchen.. 1 by mouth once daily as needed constipation 9)  Lipitor 20 Mg Tabs (Atorvastatin calcium) .Marland Kitchen.. 1 by mouth once daily for cholesterol   Patient Instructions: 1)  Please schedule a follow-up appointment in 4 months. 2)   BMP prior to visit, ICD-9: 3)  Hepatic Panel prior to visit, ICD-9: 4)  Lipid Panel prior to visit, ICD-9: 272.0 995.20 Prescriptions: LIPITOR 20 MG TABS (ATORVASTATIN CALCIUM) 1 by mouth once daily for cholesterol  #30 x 12   Entered and Authorized by:   Tresa Garter MD   Signed by:   Tresa Garter MD on 02/19/2010   Method used:   Print then Give to Patient   RxID:   [id number]

## 2010-06-24 NOTE — Letter (Signed)
Summary: [facility] Instructions  [facility]  [address] [place], [place] [address]   Phone: [phone]  Fax: [phone]       Oscar [name]    07/30/1940    MRN: [id number]        Procedure Day Dorna Bloom: Wednesday 03-12-10     Arrival Time: 2:30 p.m.     Procedure Time: 3:30 p.m.     Location of Procedure:                      [facility] (4th Floor)   PREPARATION FOR COLONOSCOPY WITH MOVIPREP   Starting 5 days prior to your procedure  03-07-10  do not eat nuts, seeds, popcorn, corn, beans, peas,  salads, or any raw vegetables.  Do not take any fiber supplements (e.g. Metamucil, Citrucel, and Benefiber).  THE DAY BEFORE YOUR PROCEDURE         DATE:  03-11-10  DAY: Tuesday  1.  Drink clear liquids the entire day-NO SOLID FOOD  2.  Do not drink anything colored red or purple.  Avoid juices with pulp.  No orange juice.  3.  Drink at least 64 oz. (8 glasses) of fluid/clear liquids during the day to prevent dehydration and help the prep work efficiently.  CLEAR LIQUIDS INCLUDE: Water Jello Ice Popsicles Tea (sugar ok, no milk/cream) Powdered fruit flavored drinks Coffee (sugar ok, no milk/cream) Gatorade Juice: apple, white grape, white cranberry  Lemonade Clear bullion, consomm, broth Carbonated beverages (any kind) Strained chicken noodle soup Hard Candy                             4.  In the morning, mix first dose of MoviPrep solution:    Empty 1 Pouch A and 1 Pouch B into the disposable container    Add lukewarm drinking water to the top line of the container. Mix to dissolve    Refrigerate (mixed solution should be used within 24 hrs)  5.  Begin drinking the prep at 5:00 p.m. The MoviPrep container is divided by 4 marks.   Every 15 minutes drink the solution down to the next mark (approximately 8 oz) until the full liter is complete.   6.  Follow completed prep with 16 oz of clear liquid of your choice (Nothing red or  purple).  Continue to drink clear liquids until bedtime.  7.  Before going to bed, mix second dose of MoviPrep solution:    Empty 1 Pouch A and 1 Pouch B into the disposable container    Add lukewarm drinking water to the top line of the container. Mix to dissolve    Refrigerate  THE DAY OF YOUR PROCEDURE      DATE:  03-12-10  DAY:  Wednesday  Beginning at  10:30 a.m. (5 hours before procedure):         1. Every 15 minutes, drink the solution down to the next mark (approx 8 oz) until the full liter is complete.  2. Follow completed prep with 16 oz. of clear liquid of your choice.    3. You may drink clear liquids until  1:30 p.m. (2 HOURS BEFORE PROCEDURE).   MEDICATION INSTRUCTIONS  Unless otherwise instructed, you should take regular prescription medications with a small sip of water   as early as possible the morning of your procedure.   Additional medication instructions: n/a  OTHER INSTRUCTIONS  You will need a responsible adult at least 69 years of age to accompany you and drive you home.   This person must remain in the waiting room during your procedure.  Wear loose fitting clothing that is easily removed.  Leave jewelry and other valuables at home.  However, you may wish to bring a book to read or  an iPod/MP3 player to listen to music as you wait for your procedure to start.  Remove all body piercing jewelry and leave at home.  Total time from sign-in until discharge is approximately 2-3 hours.  You should go home directly after your procedure and rest.  You can resume normal activities the  day after your procedure.  The day of your procedure you should not:   Drive   Make legal decisions   Operate machinery   Drink alcohol   Return to work  You will receive specific instructions about eating, activities and medications before you leave.    The above instructions have been reviewed and explained to me by   Sherren Kerns RN  February 26, 2010 5:09 PM   I fully understand and can verbalize these instructions _____________________________ Date _________

## 2010-06-24 NOTE — Progress Notes (Signed)
Summary: cx fee?   Phone Note Call from Patient   Caller: Patient Call For: Dr. Russella Dar Summary of Call: pt cancelled ECL scheduled for this Wednesday and rescheduled for November... schedule conflict Dr. Russella Dar, do you wish to charge this pt a cx fee? Initial call taken by: Vallarie Mare,  March 10, 2010 3:46 PM  Follow-up for Phone Call        If he does not have a valid reason he should be charged.  Follow-up by: Meryl Dare MD FACG,  March 10, 2010 3:56 PM  Additional Follow-up for Phone Call Additional follow up Details #1::        Patient BILLED. Additional Follow-up by: Leanor Kail [facility],  March 24, 2010 2:09 PM

## 2010-06-24 NOTE — Progress Notes (Signed)
Summary: Constipation  Phone Note Call from Patient Call back at [phone]   Summary of Call: Patient called c/o severe constipation and is worried he may be impacted. He has tried enemas, and OTC Phillips without any relief. Patient has not tried Miralax. Patient requests something to loosen him up and help him go to the restroom ASAP like colonoscopy prep. Please advise. Initial call taken by: Lucious Groves,  July 10, 2009 2:39 PM  Follow-up for Phone Call        ok nulytely Go to ER if feeling bad! Follow-up by: Tresa Garter MD,  July 11, 2009 7:40 AM  Additional Follow-up for Phone Call Additional follow up Details #1::        left mess to call office back..........................Marland KitchenLamar Sprinkles, CMA  July 11, 2009 9:46 AM   left mess to call office back..............................Marland KitchenLamar Sprinkles, CMA  July 11, 2009 4:05 PM     Additional Follow-up for Phone Call Additional follow up Details #2::    Spoke with pt, he does not need rx but will call office back with any further problems Follow-up by: Lamar Sprinkles, CMA,  July 12, 2009 1:53 PM  New/Updated Medications: NULYTELY WITH FLAVOR PACKS 420 GM SOLR (PEG 3350-KCL-NA BICARB-NACL) as dirr for colon prep Prescriptions: NULYTELY WITH FLAVOR PACKS 420 GM SOLR (PEG 3350-KCL-NA BICARB-NACL) as dirr for colon prep  #1 x 1   Entered and Authorized by:   Tresa Garter MD   Signed by:   Lamar Sprinkles, CMA on 07/11/2009   Method used:   Electronically to        [facility]. [phone]* (retail)       [phone]       [place], [place]  [address]       Ph: [phone]       Fax: [phone]   RxID:   [id number]

## 2010-06-24 NOTE — Procedures (Signed)
Summary: Upper Endoscopy  Patient: Oscar [name] Note: All result statuses are Final unless otherwise noted.  Tests: (1) Upper Endoscopy (EGD)   EGD Upper Endoscopy       DONE     [facility]     [address]     [place], [place]  [address]           ENDOSCOPY PROCEDURE REPORT           PATIENT:  Oscar [name], Oscar [name]  MR#:  [id number]     BIRTHDATE:  02-02-1941, 69 yrs. old  GENDER:  male     ENDOSCOPIST:  Judie Petit T. Russella Dar, MD, [facility]           PROCEDURE DATE:  04/22/2010     PROCEDURE:  EGD with biopsy, [address]     ASA CLASS:  Class II     INDICATIONS:  h/o Barrett's Esophagus     MEDICATIONS:  There was residual sedation effect present from     prior procedure, Fentanyl 25 mcg IV, Versed 2 mg IV     TOPICAL ANESTHETIC:  Exactacain Spray     DESCRIPTION OF PROCEDURE:   After the risks benefits and     alternatives of the procedure were thoroughly explained, informed     consent was obtained.  The LB GIF-H180 [id number] endoscope was     introduced through the mouth and advanced to the second portion of     the duodenum, without limitations.  The instrument was slowly     withdrawn as the mucosa was fully examined.     <<PROCEDUREIMAGES>>     Esophagitis was found in the mid esophagus. It was erosive. LA     Grade B.  Barrett's esophagus was found in the distal esophagus.     It was from 36 - 40 cm in size. Four quadrant biopsies were taken     every 2 cm through the length of the barretts.  Moderate gastritis     was found in the body and the antrum of the stomach. It was     erythematous and granular. Multiple biopsies were obtained and     sent to pathology.  Duodenitis was found in the bulb of the     duodenum. It was erosive. Otherwise the examination was normal.     Retroflexed views revealed a hiatal hernia, 4 cm.  The scope was     then withdrawn from the patient and the procedure completed.           COMPLICATIONS:  None           ENDOSCOPIC IMPRESSION:     1)  Erosive esophagitis     2) 36 - 40 cm barrett's esophagus     3) Moderate gastritis     4) Duodenitis in the bulb of duodenum     5) iatal hernia           RECOMMENDATIONS:     1) Anti-reflux regimen long term     2) Await pathology results     3) PPI qam, every day, long term     4) REPEAT SURVEILLANCE EGD IN 2 YEARS if no dysplasia, pending     biopsy review           Malcolm T. Russella Dar, MD, [facility]           n.     eSIGNED:   Venita Lick. Stark at 04/22/2010 11:47 AM  Bach, Rocchi, [id number]  Note: An exclamation mark (!) indicates a result that was not dispersed into the flowsheet. Document Creation Date: 04/22/2010 11:47 AM _______________________________________________________________________  (1) Order result status: Final Collection or observation date-time: 04/22/2010 11:40 Requested date-time:  Receipt date-time:  Reported date-time:  Referring Physician:   Ordering Physician: Claudette Head [id number]) Specimen Source:  Source: Launa Grill Order Number: [id number] Lab site:   Appended Document: Upper Endoscopy     Procedures Next Due Date:    EGD: 03/2012

## 2010-07-02 NOTE — Letter (Signed)
Summary: [facility] Study/[facility]  [facility] Study/[facility]   Imported By: Sherian Rein 06/26/2010 14:50:37  _____________________________________________________________________  External Attachment:    Type:   Image     Comment:   External Document

## 2010-07-02 NOTE — Assessment & Plan Note (Signed)
Summary: 4 MO ROV /NWS  #   Vital Signs:  Patient profile:   70 year old male Height:      73 inches Weight:      212 pounds BMI:     28.07 Temp:     98.8 degrees F oral Pulse rate:   68 / minute Pulse rhythm:   regular Resp:     16 per minute BP sitting:   130 / 78  (left arm) Cuff size:   regular  Vitals Entered By: Lanier Prude, CMA(AAMA) (June 23, 2010 11:16 AM) CC: 4 mo f/u c/o persistant cough and low energy Is Patient Diabetic? No Comments wants to discuss erectile dysfunction   CC:  4 mo f/u c/o persistant cough and low energy.  History of Present Illness: The patient presents for a follow up of hypertension, hyperlipidemia  C/o fatigue after URI he had around Christmas - he had a CXR at [facility] ? pneumonia; improving... C/o ED  Current Medications (verified): 1)  Aspirin 81 Mg  Tabs (Aspirin) .... Take One Tablet Once Daily 2)  Vitamin D3 1000 Unit  Tabs (Cholecalciferol) 3)  Levitra 20 Mg Tabs (Vardenafil Hcl) .... 1/2-1 Once Daily As Needed 4)  Sertraline Hcl 100 Mg Tabs (Sertraline Hcl) .Marland Kitchen.. 11/2 Once Daily 5)  Abilify 10 Mg Tabs (Aripiprazole) .... Once Daily 6)  Omeprazole 20 Mg Cpdr (Omeprazole) .... Two By Mouth Daily 7)  Nulytely With Flavor Packs 420 Gm Solr (Peg 3350-Kcl-Na Bicarb-Nacl) .... As Dirr For Colon Prep 8)  Amitiza 24 Mcg Caps (Lubiprostone) .Marland Kitchen.. 1 By Mouth Once Daily As Needed Constipation 9)  Lipitor 20 Mg Tabs (Atorvastatin Calcium) .Marland Kitchen.. 1 By Mouth Once Daily For Cholesterol  Allergies (verified): 1)  ! Vioxx 2)  ! Celebrex 3)  ! Penicillin  Past History:  Past Medical History: Last updated: 02/19/2010 HYPOGONADISM (ICD-257.2) ANXIETY (ICD-300.00) Hx of ROSACEA (ICD-695.3) ALLERGIC RHINITIS (ICD-477.9) BARRETT'S ESOPHAGUS, HX OF (ICD-V12.79)   Dr Russella Dar GASTROESOPHAGEAL REFLUX DISEASE (ICD-530.81) ADENOMATOUS COLONIC POLYP (ICD-211.3) DYSLIPIDEMIA (ICD-272.4) DEPRESSION (ICD-311)        Dr Evelene Croon UTI 2009  Benign prostatic  hypertrophy  s/p Bx 2007 MACROHEMATURIA DR [facility] Depression Dr Evelene Croon 2010 GERD  Social History: Occupation: [employer] - retired 2010 in june Married - sepparated 2010 reuniting 2012 Alcohol use-no since 2006, restarted 2010, visiting AA Former Smoker 70 yo GD was admitted  Review of Systems       The patient complains of prolonged cough.  The patient denies fever, chest pain, dyspnea on exertion, and abdominal pain.    Physical Exam  General:  Well-developed,well-nourished,in no acute distress; alert,appropriate and cooperative throughout examination Mouth:  Oral mucosa and oropharynx without lesions or exudates.  Teeth in good repair. Lungs:  Normal respiratory effort, chest expands symmetrically. Lungs are clear to auscultation, no crackles or wheezes. Heart:  Normal rate and regular rhythm. S1 and S2 normal without gallop, murmur, click, rub or other extra sounds. Abdomen:  Bowel sounds positive,abdomen soft and non-tender without masses, organomegaly or hernias noted. Msk:  No deformity or scoliosis noted of thoracic or lumbar spine.   Neurologic:  No cranial nerve deficits noted. Station and gait are normal. Plantar reflexes are down-going bilaterally. DTRs are symmetrical throughout. Sensory, motor and coordinative functions appear intact. Skin:  WNL Psych:  Oriented X3, memory intact for recent and remote, normally interactive, good eye contact, and depressed affect.  Not suicidal   Impression & Recommendations:  Problem # 1:  DYSLIPIDEMIA (ICD-272.4) Assessment  Improved The labs were reviewed with the patient.  The following medications were removed from the medication list:    Lipitor 20 Mg Tabs (Atorvastatin calcium) .Marland Kitchen... 1 by mouth once daily for cholesterol His updated medication list for this problem includes:    Lipitor 40 Mg Tabs (Atorvastatin calcium) .Marland Kitchen... 1 by mouth once daily for cholesterol  Problem # 2:  ERECTILE DYSFUNCTION (ICD-607.84) Assessment:  Deteriorated F/u Dr Serena Colonel Hold Zoloft on certain days His updated medication list for this problem includes:    Levitra 20 Mg Tabs (Vardenafil hcl) .Marland Kitchen... 1/2-1 once daily as needed  Problem # 3:  GERD (ICD-530.81) Assessment: Unchanged  His updated medication list for this problem includes:    Omeprazole 20 Mg Cpdr (Omeprazole) .Marland Kitchen..Marland Kitchen Two by mouth daily  Problem # 4:  DEPRESSION (ICD-311) Assessment: Improved  His updated medication list for this problem includes:    Sertraline Hcl 100 Mg Tabs (Sertraline hcl) .Marland Kitchen... 11/2 once daily  Problem # 5:  HYPOGONADISM (ICD-257.2) Assessment: Improved On the regimen of medicine(s) reflected in the chart    Problem # 6:  FATIGUE (ICD-780.79) post-URI Assessment: Improved Will watch  Complete Medication List: 1)  Aspirin 81 Mg Tabs (Aspirin) .... Take one tablet once daily 2)  Vitamin D3 1000 Unit Tabs (Cholecalciferol) 3)  Levitra 20 Mg Tabs (Vardenafil hcl) .... 1/2-1 once daily as needed 4)  Sertraline Hcl 100 Mg Tabs (Sertraline hcl) .Marland Kitchen.. 11/2 once daily 5)  Abilify 10 Mg Tabs (Aripiprazole) .... Once daily 6)  Omeprazole 20 Mg Cpdr (Omeprazole) .... Two by mouth daily 7)  Nulytely With Flavor Packs 420 Gm Solr (Peg 3350-kcl-na bicarb-nacl) .... As dirr for colon prep 8)  Amitiza 24 Mcg Caps (Lubiprostone) .Marland Kitchen.. 1 by mouth once daily as needed constipation 9)  Lipitor 40 Mg Tabs (Atorvastatin calcium) .Marland Kitchen.. 1 by mouth once daily for cholesterol  Patient Instructions: 1)  Try L-arginine (Arginmax) 2)  Please schedule a follow-up appointment in 3 months. 3)  BMP prior to visit, ICD-9: 4)  Hepatic Panel prior to visit, ICD-9: 5)  Lipid Panel prior to visit, ICD-9: 6)  TSH prior to visit, ICD-9:780.79   272.0 Prescriptions: LIPITOR 40 MG TABS (ATORVASTATIN CALCIUM) 1 by mouth once daily for cholesterol  #30 x 12   Entered and Authorized by:   Tresa Garter MD   Signed by:   Tresa Garter MD on 06/24/2010   Method  used:   Electronically to        [facility]. [phone]* (retail)       [phone]       [place], [place]  [address]       Ph: [phone]       Fax: [phone]   RxID:   [id number]    Orders Added: 1)  Est. Patient Level IV [42595]

## 2010-09-04 ENCOUNTER — Other Ambulatory Visit: Payer: Self-pay | Admitting: Internal Medicine

## 2010-09-04 DIAGNOSIS — E7800 Pure hypercholesterolemia, unspecified: Secondary | ICD-10-CM

## 2010-09-04 DIAGNOSIS — R5381 Other malaise: Secondary | ICD-10-CM

## 2010-09-12 ENCOUNTER — Other Ambulatory Visit (INDEPENDENT_AMBULATORY_CARE_PROVIDER_SITE_OTHER): Payer: Self-pay

## 2010-09-12 ENCOUNTER — Other Ambulatory Visit (INDEPENDENT_AMBULATORY_CARE_PROVIDER_SITE_OTHER): Payer: Self-pay | Admitting: Internal Medicine

## 2010-09-12 DIAGNOSIS — R5381 Other malaise: Secondary | ICD-10-CM

## 2010-09-12 DIAGNOSIS — R5383 Other fatigue: Secondary | ICD-10-CM

## 2010-09-12 DIAGNOSIS — E785 Hyperlipidemia, unspecified: Secondary | ICD-10-CM

## 2010-09-12 DIAGNOSIS — E7800 Pure hypercholesterolemia, unspecified: Secondary | ICD-10-CM

## 2010-09-12 LAB — LIPID PANEL
Cholesterol: 223 mg/dL — ABNORMAL HIGH (ref 0–200)
Total CHOL/HDL Ratio: 4
Triglycerides: 276 mg/dL — ABNORMAL HIGH (ref 0.0–149.0)
VLDL: 55.2 mg/dL — ABNORMAL HIGH (ref 0.0–40.0)

## 2010-09-12 LAB — BASIC METABOLIC PANEL
BUN: 12 mg/dL (ref 6–23)
Calcium: 9.6 mg/dL (ref 8.4–10.5)
Creatinine, Ser: 0.9 mg/dL (ref 0.4–1.5)
GFR: 92.47 mL/min (ref >60.00)

## 2010-09-12 LAB — HEPATIC FUNCTION PANEL: Total Bilirubin: 0.7 mg/dL (ref 0.3–1.2)

## 2010-09-18 ENCOUNTER — Encounter: Payer: Self-pay | Admitting: Internal Medicine

## 2010-09-18 ENCOUNTER — Ambulatory Visit (INDEPENDENT_AMBULATORY_CARE_PROVIDER_SITE_OTHER): Payer: Medicare Other | Admitting: Internal Medicine

## 2010-09-18 DIAGNOSIS — E291 Testicular hypofunction: Secondary | ICD-10-CM

## 2010-09-18 DIAGNOSIS — R202 Paresthesia of skin: Secondary | ICD-10-CM

## 2010-09-18 DIAGNOSIS — R5383 Other fatigue: Secondary | ICD-10-CM

## 2010-09-18 DIAGNOSIS — R5381 Other malaise: Secondary | ICD-10-CM

## 2010-09-18 DIAGNOSIS — R209 Unspecified disturbances of skin sensation: Secondary | ICD-10-CM

## 2010-09-18 MED ORDER — DESVENLAFAXINE SUCCINATE ER 50 MG PO TB24: 50.0000 mg | ORAL_TABLET | Freq: Every day | ORAL | Status: DC

## 2010-09-18 MED ORDER — TESTOSTERONE 30 MG/ACT TD SOLN: 60.0000 mg | TRANSDERMAL | Status: DC

## 2010-09-18 NOTE — Patient Instructions (Addendum)
Take Abilify at 5 pm Cut back on sugar

## 2010-09-18 NOTE — Assessment & Plan Note (Signed)
On Rx 

## 2010-09-18 NOTE — Assessment & Plan Note (Signed)
Take Abilify earier - ie 5 pm

## 2010-09-18 NOTE — Progress Notes (Signed)
  Subjective:    Patient ID: Oscar [name], male    DOB: Feb 01, 1941, 70 y.o.   MRN: [id number]  HPI  C/o fatigue more in am's C/o leg numbness C/o wt gain F/u HTN  Review of Systems  Constitutional: Positive for activity change, appetite change, fatigue and unexpected weight change (gained).  HENT: Negative for neck stiffness.   Respiratory: Negative for cough.   Skin: Negative for color change.  Psychiatric/Behavioral: Positive for dysphoric mood. Negative for suicidal ideas, confusion and sleep disturbance. The patient is nervous/anxious.    BP Readings from Last 3 Encounters:  09/18/10 110/60  06/23/10 130/78  02/19/10 118/68   Wt Readings from Last 3 Encounters:  09/18/10 219 lb (99.338 kg)  06/23/10 212 lb (96.163 kg)  02/19/10 210 lb (95.255 kg)        Objective:   Physical Exam  Constitutional: He is oriented to person, place, and time. He appears well-developed.  HENT:  Mouth/Throat: Oropharynx is clear and moist.  Eyes: Conjunctivae are normal. Pupils are equal, round, and reactive to light.  Neck: Normal range of motion. No JVD present. No thyromegaly present.  Cardiovascular: Normal rate, regular rhythm, normal heart sounds and intact distal pulses.  Exam reveals no gallop and no friction rub.   No murmur heard. Pulmonary/Chest: Effort normal and breath sounds normal. No respiratory distress. He has no wheezes. He has no rales. He exhibits no tenderness.  Abdominal: Soft. Bowel sounds are normal. He exhibits no distension and no mass. There is no tenderness. There is no rebound and no guarding.  Musculoskeletal: Normal range of motion. He exhibits no edema and no tenderness.  Lymphadenopathy:    He has no cervical adenopathy.  Neurological: He is alert and oriented to person, place, and time. He has normal reflexes. No cranial nerve deficit. He exhibits normal muscle tone. Coordination normal.  Skin: Skin is warm and dry. No rash noted.  Psychiatric: He has a  normal mood and affect. His behavior is normal. Judgment and thought content normal.        Lab Results  Component Value Date   WBC 7.7 02/13/2010   HGB 14.8 02/13/2010   HCT 42.6 02/13/2010   PLT 248.0 02/13/2010   CHOL 223* 09/12/2010   TRIG 276.0* 09/12/2010   HDL 56.30 09/12/2010   LDLDIRECT 140.4 09/12/2010   ALT 37 09/12/2010   AST 27 09/12/2010   NA 141 09/12/2010   K 4.9 09/12/2010   CL 103 09/12/2010   CREATININE 0.9 09/12/2010   BUN 12 09/12/2010   CO2 28 09/12/2010   TSH 1.55 09/12/2010   PSA 3.21 02/13/2010     Assessment & Plan:  FATIGUE Take Abilify earier - ie 5 pm  HYPOGONADISM On Rx    Robable B M. Paresthetica  Loosen the belt Check B12  Depression  F/u with dr Evelene Croon

## 2010-12-15 ENCOUNTER — Other Ambulatory Visit (INDEPENDENT_AMBULATORY_CARE_PROVIDER_SITE_OTHER): Payer: Medicare Other

## 2010-12-15 DIAGNOSIS — R202 Paresthesia of skin: Secondary | ICD-10-CM

## 2010-12-15 DIAGNOSIS — R209 Unspecified disturbances of skin sensation: Secondary | ICD-10-CM

## 2010-12-15 DIAGNOSIS — R5381 Other malaise: Secondary | ICD-10-CM

## 2010-12-15 DIAGNOSIS — R5383 Other fatigue: Secondary | ICD-10-CM

## 2010-12-15 LAB — COMPREHENSIVE METABOLIC PANEL
ALT: 30 U/L (ref 0–53)
AST: 28 U/L (ref 0–37)
Albumin: 4.3 g/dL (ref 3.5–5.2)
Alkaline Phosphatase: 76 U/L (ref 39–117)
Calcium: 8.5 mg/dL (ref 8.4–10.5)
Chloride: 106 mEq/L (ref 96–112)
Potassium: 4.2 mEq/L (ref 3.5–5.1)
Sodium: 139 mEq/L (ref 135–145)
Total Protein: 7.3 g/dL (ref 6.0–8.3)

## 2010-12-18 ENCOUNTER — Encounter: Payer: Self-pay | Admitting: Internal Medicine

## 2010-12-18 ENCOUNTER — Ambulatory Visit (INDEPENDENT_AMBULATORY_CARE_PROVIDER_SITE_OTHER): Payer: Medicare Other | Admitting: Internal Medicine

## 2010-12-18 DIAGNOSIS — E785 Hyperlipidemia, unspecified: Secondary | ICD-10-CM

## 2010-12-18 DIAGNOSIS — F411 Generalized anxiety disorder: Secondary | ICD-10-CM

## 2010-12-18 DIAGNOSIS — E291 Testicular hypofunction: Secondary | ICD-10-CM

## 2010-12-18 DIAGNOSIS — F329 Major depressive disorder, single episode, unspecified: Secondary | ICD-10-CM

## 2010-12-18 DIAGNOSIS — R5383 Other fatigue: Secondary | ICD-10-CM

## 2010-12-18 DIAGNOSIS — R5381 Other malaise: Secondary | ICD-10-CM

## 2010-12-18 DIAGNOSIS — F3289 Other specified depressive episodes: Secondary | ICD-10-CM

## 2010-12-18 NOTE — Progress Notes (Signed)
  Subjective:    Patient ID: Oscar [name], male    DOB: February 08, 1941, 70 y.o.   MRN: [id number]  HPI  The patient presents for a follow-up of  chronic hypertension, chronic dyslipidemia, type 2 pre-diabetes, CAD controlled with medicines    Review of Systems  Constitutional: Negative for appetite change, fatigue and unexpected weight change.  HENT: Negative for nosebleeds, congestion, sore throat, sneezing, trouble swallowing and neck pain.   Eyes: Negative for itching and visual disturbance.  Respiratory: Negative for cough.   Cardiovascular: Negative for chest pain, palpitations and leg swelling.  Gastrointestinal: Negative for nausea, diarrhea, blood in stool and abdominal distention.  Genitourinary: Negative for frequency and hematuria.  Musculoskeletal: Negative for back pain, joint swelling and gait problem.  Skin: Negative for rash.  Neurological: Negative for dizziness, tremors, speech difficulty and weakness.  Psychiatric/Behavioral: Positive for dysphoric mood. Negative for suicidal ideas, sleep disturbance and agitation. The patient is nervous/anxious.        Objective:   Physical Exam  Constitutional: He is oriented to person, place, and time. He appears well-developed.  HENT:  Mouth/Throat: Oropharynx is clear and moist.  Eyes: Conjunctivae are normal. Pupils are equal, round, and reactive to light.  Neck: Normal range of motion. No JVD present. No thyromegaly present.  Cardiovascular: Normal rate, regular rhythm, normal heart sounds and intact distal pulses.  Exam reveals no gallop and no friction rub.   No murmur heard. Pulmonary/Chest: Effort normal and breath sounds normal. No respiratory distress. He has no wheezes. He has no rales. He exhibits no tenderness.  Abdominal: Soft. Bowel sounds are normal. He exhibits no distension and no mass. There is no tenderness. There is no rebound and no guarding.  Musculoskeletal: Normal range of motion. He exhibits no edema  and no tenderness.  Lymphadenopathy:    He has no cervical adenopathy.  Neurological: He is alert and oriented to person, place, and time. He has normal reflexes. No cranial nerve deficit. He exhibits normal muscle tone. Coordination normal.  Skin: Skin is warm and dry. No rash noted.  Psychiatric: He has a normal mood and affect. His behavior is normal. Judgment and thought content normal.        Lab Results  Component Value Date   WBC 7.7 02/13/2010   HGB 14.8 02/13/2010   HCT 42.6 02/13/2010   PLT 248.0 02/13/2010   CHOL 223* 09/12/2010   TRIG 276.0* 09/12/2010   HDL 56.30 09/12/2010   LDLDIRECT 140.4 09/12/2010   ALT 30 12/15/2010   AST 28 12/15/2010   NA 139 12/15/2010   K 4.2 12/15/2010   CL 106 12/15/2010   CREATININE 0.9 12/15/2010   BUN 13 12/15/2010   CO2 25 12/15/2010   TSH 1.55 09/12/2010   PSA 3.21 02/13/2010     Assessment & Plan:

## 2010-12-18 NOTE — Assessment & Plan Note (Signed)
On Rx 

## 2010-12-18 NOTE — Assessment & Plan Note (Signed)
On Axiron 

## 2011-04-09 ENCOUNTER — Encounter: Payer: Self-pay | Admitting: Internal Medicine

## 2011-04-09 ENCOUNTER — Other Ambulatory Visit (INDEPENDENT_AMBULATORY_CARE_PROVIDER_SITE_OTHER): Payer: Medicare Other

## 2011-04-09 ENCOUNTER — Ambulatory Visit (INDEPENDENT_AMBULATORY_CARE_PROVIDER_SITE_OTHER): Payer: Medicare Other | Admitting: Internal Medicine

## 2011-04-09 VITALS — BP 128/88 | HR 68 | Temp 97.8°F | Resp 16 | Wt 215.0 lb

## 2011-04-09 DIAGNOSIS — R5383 Other fatigue: Secondary | ICD-10-CM

## 2011-04-09 DIAGNOSIS — F3289 Other specified depressive episodes: Secondary | ICD-10-CM

## 2011-04-09 DIAGNOSIS — K219 Gastro-esophageal reflux disease without esophagitis: Secondary | ICD-10-CM

## 2011-04-09 DIAGNOSIS — F411 Generalized anxiety disorder: Secondary | ICD-10-CM

## 2011-04-09 DIAGNOSIS — Z23 Encounter for immunization: Secondary | ICD-10-CM

## 2011-04-09 DIAGNOSIS — R5381 Other malaise: Secondary | ICD-10-CM

## 2011-04-09 DIAGNOSIS — E291 Testicular hypofunction: Secondary | ICD-10-CM

## 2011-04-09 DIAGNOSIS — Z79899 Other long term (current) drug therapy: Secondary | ICD-10-CM

## 2011-04-09 DIAGNOSIS — F329 Major depressive disorder, single episode, unspecified: Secondary | ICD-10-CM

## 2011-04-09 NOTE — Assessment & Plan Note (Signed)
Chronic and severe Dr Kaur 

## 2011-04-09 NOTE — Assessment & Plan Note (Signed)
Chronic, multifatorial

## 2011-04-09 NOTE — Assessment & Plan Note (Signed)
Continue with current prescription therapy as reflected on the Med list.  

## 2011-04-09 NOTE — Assessment & Plan Note (Addendum)
Clinically is not much better Check labs Use Axiron in am (not at hs)Libido is not better due to Zoloft

## 2011-04-09 NOTE — Progress Notes (Signed)
  Subjective:    Patient ID: Oscar [name], male    DOB: Mar 02, 1941, 70 y.o.   MRN: [id number]  HPI  The patient presents for a follow-up of  chronic hypertension, chronic dyslipidemia, hypogonadism controlled with medicines    Review of Systems  Constitutional: Negative for appetite change, fatigue and unexpected weight change.  HENT: Negative for nosebleeds, congestion, sore throat, sneezing, trouble swallowing and neck pain.   Eyes: Negative for itching and visual disturbance.  Respiratory: Negative for cough.   Cardiovascular: Negative for chest pain, palpitations and leg swelling.  Gastrointestinal: Negative for nausea, diarrhea, blood in stool and abdominal distention.  Genitourinary: Negative for frequency and hematuria.  Musculoskeletal: Negative for back pain, joint swelling and gait problem.  Skin: Negative for rash.  Neurological: Negative for dizziness, tremors, speech difficulty and weakness.  Psychiatric/Behavioral: Positive for dysphoric mood. Negative for suicidal ideas, sleep disturbance and agitation. The patient is nervous/anxious.        Objective:   Physical Exam  Constitutional: He is oriented to person, place, and time. He appears well-developed.  HENT:  Mouth/Throat: Oropharynx is clear and moist.  Eyes: Conjunctivae are normal. Pupils are equal, round, and reactive to light.  Neck: Normal range of motion. No JVD present. No thyromegaly present.  Cardiovascular: Normal rate, regular rhythm, normal heart sounds and intact distal pulses.  Exam reveals no gallop and no friction rub.   No murmur heard. Pulmonary/Chest: Effort normal and breath sounds normal. No respiratory distress. He has no wheezes. He has no rales. He exhibits no tenderness.  Abdominal: Soft. Bowel sounds are normal. He exhibits no distension and no mass. There is no tenderness. There is no rebound and no guarding.  Musculoskeletal: Normal range of motion. He exhibits no edema and no  tenderness.  Lymphadenopathy:    He has no cervical adenopathy.  Neurological: He is alert and oriented to person, place, and time. He has normal reflexes. No cranial nerve deficit. He exhibits normal muscle tone. Coordination normal.  Skin: Skin is warm and dry. No rash noted.  Psychiatric: He has a normal mood and affect. His behavior is normal. Judgment and thought content normal.          Assessment & Plan:

## 2011-04-10 LAB — BASIC METABOLIC PANEL
BUN: 19 mg/dL (ref 6–23)
Chloride: 105 mEq/L (ref 96–112)
Creatinine, Ser: 0.8 mg/dL (ref 0.4–1.5)
Glucose, Bld: 67 mg/dL — ABNORMAL LOW (ref 70–99)
Potassium: 4.6 mEq/L (ref 3.5–5.1)

## 2011-04-10 LAB — HEPATIC FUNCTION PANEL
ALT: 26 U/L (ref 0–53)
AST: 24 U/L (ref 0–37)
Albumin: 4.1 g/dL (ref 3.5–5.2)
Total Protein: 7.2 g/dL (ref 6.0–8.3)

## 2011-04-10 LAB — LIPID PANEL
Cholesterol: 178 mg/dL (ref 0–200)
Triglycerides: 134 mg/dL (ref 0.0–149.0)

## 2011-04-13 ENCOUNTER — Ambulatory Visit: Payer: Medicare Other

## 2011-04-13 DIAGNOSIS — Z23 Encounter for immunization: Secondary | ICD-10-CM

## 2011-04-14 LAB — TESTOSTERONE, FREE, TOTAL, SHBG
Sex Hormone Binding: 25 nmol/L (ref 13–71)
Testosterone, Free: 42.6 pg/mL — ABNORMAL LOW (ref 47.0–244.0)
Testosterone-% Free: 2.2 % (ref 1.6–2.9)

## 2011-04-15 ENCOUNTER — Telehealth: Payer: Self-pay | Admitting: Internal Medicine

## 2011-04-15 NOTE — Telephone Encounter (Signed)
Oscar [name], please, inform patient that all labs are ok except for a low testosterone. OK to start using one extra squirt of the gel per day Thx

## 2011-04-15 NOTE — Telephone Encounter (Signed)
Pt informed

## 2011-07-14 ENCOUNTER — Other Ambulatory Visit: Payer: Self-pay | Admitting: *Deleted

## 2011-07-14 MED ORDER — ATORVASTATIN CALCIUM 40 MG PO TABS: 40.0000 mg | ORAL_TABLET | Freq: Every day | ORAL | Status: DC

## 2011-08-07 ENCOUNTER — Ambulatory Visit: Payer: Medicare Other | Admitting: Internal Medicine

## 2011-09-30 ENCOUNTER — Encounter: Payer: Self-pay | Admitting: Internal Medicine

## 2011-09-30 ENCOUNTER — Ambulatory Visit (INDEPENDENT_AMBULATORY_CARE_PROVIDER_SITE_OTHER): Payer: Medicare Other | Admitting: Internal Medicine

## 2011-09-30 VITALS — BP 140/80 | HR 80 | Temp 97.7°F | Resp 16 | Wt 211.0 lb

## 2011-09-30 DIAGNOSIS — R5381 Other malaise: Secondary | ICD-10-CM

## 2011-09-30 DIAGNOSIS — M545 Low back pain, unspecified: Secondary | ICD-10-CM | POA: Insufficient documentation

## 2011-09-30 DIAGNOSIS — E291 Testicular hypofunction: Secondary | ICD-10-CM

## 2011-09-30 DIAGNOSIS — F3289 Other specified depressive episodes: Secondary | ICD-10-CM

## 2011-09-30 DIAGNOSIS — E785 Hyperlipidemia, unspecified: Secondary | ICD-10-CM

## 2011-09-30 DIAGNOSIS — F329 Major depressive disorder, single episode, unspecified: Secondary | ICD-10-CM

## 2011-09-30 DIAGNOSIS — R5383 Other fatigue: Secondary | ICD-10-CM

## 2011-09-30 NOTE — Assessment & Plan Note (Signed)
No change 

## 2011-09-30 NOTE — Assessment & Plan Note (Signed)
Continue with current prescription therapy as reflected on the Med list. Labs  

## 2011-09-30 NOTE — Assessment & Plan Note (Signed)
Continue with current prescription therapy as reflected on the Med list.  

## 2011-09-30 NOTE — Assessment & Plan Note (Signed)
Chronic irrad to L hip 2012-13 In PT

## 2011-09-30 NOTE — Assessment & Plan Note (Signed)
Chronic and severe Dr Evelene Croon  Doing OK on Rx

## 2011-09-30 NOTE — Progress Notes (Signed)
Patient ID: Oscar [name], male   DOB: [birth year], 71 y.o.   MRN: [id number]  Subjective:    Patient ID: Oscar [name], male    DOB: [birth year], 71 y.o.   MRN: [id number]  HPI  The patient presents for a follow-up of  chronic hypertension, chronic dyslipidemia, hypogonadism controlled with medicines  He saw a urologist for hematuria He saw an Ortho for L hip pain due to LBP - in PT now, better  BP Readings from Last 3 Encounters:  09/30/11 140/80  04/09/11 128/88  12/18/10 130/82   Wt Readings from Last 3 Encounters:  09/30/11 211 lb (95.709 kg)  04/09/11 215 lb (97.523 kg)  12/18/10 217 lb (98.431 kg)        Review of Systems  Constitutional: Negative for appetite change, fatigue and unexpected weight change.  HENT: Negative for nosebleeds, congestion, sore throat, sneezing, trouble swallowing and neck pain.   Eyes: Negative for itching and visual disturbance.  Respiratory: Negative for cough.   Cardiovascular: Negative for chest pain, palpitations and leg swelling.  Gastrointestinal: Negative for nausea, diarrhea, blood in stool and abdominal distention.  Genitourinary: Negative for frequency and hematuria.  Musculoskeletal: Negative for back pain, joint swelling and gait problem.  Skin: Negative for rash.  Neurological: Negative for dizziness, tremors, speech difficulty and weakness.  Psychiatric/Behavioral: Positive for dysphoric mood. Negative for suicidal ideas, sleep disturbance and agitation. The patient is nervous/anxious.        Objective:   Physical Exam  Constitutional: He is oriented to person, place, and time. He appears well-developed.  HENT:  Mouth/Throat: Oropharynx is clear and moist.  Eyes: Conjunctivae are normal. Pupils are equal, round, and reactive to light.  Neck: Normal range of motion. No JVD present. No thyromegaly present.  Cardiovascular: Normal rate, regular rhythm, normal heart sounds and intact distal pulses.  Exam reveals no gallop  and no friction rub.   No murmur heard. Pulmonary/Chest: Effort normal and breath sounds normal. No respiratory distress. He has no wheezes. He has no rales. He exhibits no tenderness.  Abdominal: Soft. Bowel sounds are normal. He exhibits no distension and no mass. There is no tenderness. There is no rebound and no guarding.  Musculoskeletal: Normal range of motion. He exhibits no edema and no tenderness.  Lymphadenopathy:    He has no cervical adenopathy.  Neurological: He is alert and oriented to person, place, and time. He has normal reflexes. No cranial nerve deficit. He exhibits normal muscle tone. Coordination normal.  Skin: Skin is warm and dry. No rash noted.  Psychiatric: He has a normal mood and affect. His behavior is normal. Judgment and thought content normal.   Lab Results  Component Value Date   WBC 7.7 02/13/2010   HGB 14.8 02/13/2010   HCT 42.6 02/13/2010   PLT 248.0 02/13/2010   GLUCOSE 67* 04/09/2011   CHOL 178 04/09/2011   TRIG 134.0 04/09/2011   HDL 55.80 04/09/2011   LDLDIRECT 140.4 09/12/2010   LDLCALC 95 04/09/2011   ALT 26 04/09/2011   AST 24 04/09/2011   NA 140 04/09/2011   K 4.6 04/09/2011   CL 105 04/09/2011   CREATININE 0.8 04/09/2011   BUN 19 04/09/2011   CO2 27 04/09/2011   TSH 1.22 04/09/2011   PSA 3.21 02/13/2010   HGBA1C 5.8 04/09/2011          Assessment & Plan:

## 2012-01-27 ENCOUNTER — Telehealth: Payer: Self-pay | Admitting: Internal Medicine

## 2012-01-27 DIAGNOSIS — N400 Enlarged prostate without lower urinary tract symptoms: Secondary | ICD-10-CM

## 2012-01-27 DIAGNOSIS — E291 Testicular hypofunction: Secondary | ICD-10-CM

## 2012-01-27 DIAGNOSIS — R5383 Other fatigue: Secondary | ICD-10-CM

## 2012-01-27 DIAGNOSIS — N529 Male erectile dysfunction, unspecified: Secondary | ICD-10-CM

## 2012-01-27 NOTE — Telephone Encounter (Signed)
Please advise what labs.  

## 2012-01-27 NOTE — Telephone Encounter (Signed)
TSH, BMET, testosterone Thx

## 2012-01-27 NOTE — Telephone Encounter (Signed)
The pt called and is hoping to get a labs done without an appt.  He asked me to check with you to see if this can be done... Thanks!   Callback  - [phone]

## 2012-01-28 NOTE — Telephone Encounter (Signed)
Labs entered. Left detailed mess informing pt.  

## 2012-02-03 ENCOUNTER — Other Ambulatory Visit (INDEPENDENT_AMBULATORY_CARE_PROVIDER_SITE_OTHER): Payer: Medicare Other

## 2012-02-03 DIAGNOSIS — E291 Testicular hypofunction: Secondary | ICD-10-CM

## 2012-02-03 DIAGNOSIS — R5381 Other malaise: Secondary | ICD-10-CM

## 2012-02-03 DIAGNOSIS — N400 Enlarged prostate without lower urinary tract symptoms: Secondary | ICD-10-CM

## 2012-02-03 DIAGNOSIS — N529 Male erectile dysfunction, unspecified: Secondary | ICD-10-CM

## 2012-02-03 LAB — TSH: TSH: 1.48 u[IU]/mL (ref 0.35–5.50)

## 2012-02-04 LAB — BASIC METABOLIC PANEL
Calcium: 9.1 mg/dL (ref 8.4–10.5)
Creatinine, Ser: 0.9 mg/dL (ref 0.4–1.5)
GFR: 86.35 mL/min (ref >60.00)
Sodium: 144 mEq/L (ref 135–145)

## 2012-02-05 ENCOUNTER — Telehealth: Payer: Self-pay | Admitting: Internal Medicine

## 2012-02-05 NOTE — Telephone Encounter (Signed)
Oscar [name], please, inform patient that his testost is low: is he using Axiron? Thx

## 2012-02-06 ENCOUNTER — Other Ambulatory Visit: Payer: Self-pay | Admitting: Internal Medicine

## 2012-02-08 NOTE — Telephone Encounter (Signed)
Pt informed. He states he has not been taking it regularly but will start.

## 2012-02-08 NOTE — Telephone Encounter (Signed)
Noted. Agree Thx! 

## 2012-02-16 ENCOUNTER — Ambulatory Visit (INDEPENDENT_AMBULATORY_CARE_PROVIDER_SITE_OTHER): Payer: Medicare Other | Admitting: Internal Medicine

## 2012-02-16 ENCOUNTER — Ambulatory Visit (INDEPENDENT_AMBULATORY_CARE_PROVIDER_SITE_OTHER)
Admission: RE | Admit: 2012-02-16 | Discharge: 2012-02-16 | Disposition: A | Discharge status: Home / Self Care | Payer: Medicare Other | Source: Ambulatory Visit | Attending: Internal Medicine | Admitting: Internal Medicine

## 2012-02-16 ENCOUNTER — Telehealth: Payer: Self-pay | Admitting: Internal Medicine

## 2012-02-16 ENCOUNTER — Encounter: Payer: Self-pay | Admitting: Internal Medicine

## 2012-02-16 VITALS — BP 120/80 | HR 68 | Temp 98.4°F | Resp 16 | Wt 218.5 lb

## 2012-02-16 DIAGNOSIS — R0989 Other specified symptoms and signs involving the circulatory and respiratory systems: Secondary | ICD-10-CM

## 2012-02-16 DIAGNOSIS — E291 Testicular hypofunction: Secondary | ICD-10-CM

## 2012-02-16 DIAGNOSIS — R0600 Dyspnea, unspecified: Secondary | ICD-10-CM

## 2012-02-16 DIAGNOSIS — R0609 Other forms of dyspnea: Secondary | ICD-10-CM

## 2012-02-16 NOTE — Assessment & Plan Note (Signed)
Restart testosterone.  Compliance was encouraged.

## 2012-02-16 NOTE — Progress Notes (Signed)
Subjective:    Patient ID: Oscar [name], male    DOB: 1940-08-17, 71 y.o.   MRN: [id number]  Shortness of Breath This is a recurrent problem. The current episode started more than 1 year ago. The problem occurs intermittently. The problem has been waxing and waning. The average episode lasts 3 minutes. Pertinent negatives include no chest pain, leg swelling, neck pain, rash or sore throat. Exacerbated by: bending over, diaphragm compression.  Worse with overeating   The patient presents for a follow-up of  chronic hypertension, chronic dyslipidemia, hypogonadism controlled with medicines  He saw a urologist for hematuria He saw an Ortho for L hip pain due to LBP - in PT now, better  BP Readings from Last 3 Encounters:  02/16/12 120/80  09/30/11 140/80  04/09/11 128/88   Wt Readings from Last 3 Encounters:  02/16/12 218 lb 8 oz (99.111 kg)  09/30/11 211 lb (95.709 kg)  04/09/11 215 lb (97.523 kg)        Review of Systems  Constitutional: Negative for appetite change, fatigue and unexpected weight change.  HENT: Negative for nosebleeds, congestion, sore throat, sneezing, trouble swallowing and neck pain.   Eyes: Negative for itching and visual disturbance.  Respiratory: Positive for shortness of breath. Negative for cough.   Cardiovascular: Negative for chest pain, palpitations and leg swelling.  Gastrointestinal: Negative for nausea, diarrhea, blood in stool and abdominal distention.  Genitourinary: Negative for frequency and hematuria.  Musculoskeletal: Negative for back pain, joint swelling and gait problem.  Skin: Negative for rash.  Neurological: Negative for dizziness, tremors, speech difficulty and weakness.  Psychiatric/Behavioral: Positive for dysphoric mood. Negative for suicidal ideas, disturbed wake/sleep cycle and agitation. The patient is nervous/anxious.        Objective:   Physical Exam  Constitutional: He is oriented to person, place, and time. He  appears well-developed.  HENT:  Mouth/Throat: Oropharynx is clear and moist.  Eyes: Conjunctivae normal are normal. Pupils are equal, round, and reactive to light.  Neck: Normal range of motion. No JVD present. No thyromegaly present.  Cardiovascular: Normal rate, regular rhythm, normal heart sounds and intact distal pulses.  Exam reveals no gallop and no friction rub.   No murmur heard. Pulmonary/Chest: Effort normal and breath sounds normal. No respiratory distress. He has no wheezes. He has no rales. He exhibits no tenderness.  Abdominal: Soft. Bowel sounds are normal. He exhibits no distension and no mass. There is no tenderness. There is no rebound and no guarding.  Musculoskeletal: Normal range of motion. He exhibits no edema and no tenderness.  Lymphadenopathy:    He has no cervical adenopathy.  Neurological: He is alert and oriented to person, place, and time. He has normal reflexes. No cranial nerve deficit. He exhibits normal muscle tone. Coordination normal.  Skin: Skin is warm and dry. No rash noted.  Psychiatric: He has a normal mood and affect. His behavior is normal. Judgment and thought content normal.   Lab Results  Component Value Date   WBC 7.7 02/13/2010   HGB 14.8 02/13/2010   HCT 42.6 02/13/2010   PLT 248.0 02/13/2010   GLUCOSE 83 02/03/2012   CHOL 178 04/09/2011   TRIG 134.0 04/09/2011   HDL 55.80 04/09/2011   LDLDIRECT 140.4 09/12/2010   LDLCALC 95 04/09/2011   ALT 26 04/09/2011   AST 24 04/09/2011   NA 144 02/03/2012   K 4.3 02/03/2012   CL 107 02/03/2012   CREATININE 0.9 02/03/2012   BUN 15  02/03/2012   CO2 25 02/03/2012   TSH 1.48 02/03/2012   PSA 3.21 02/13/2010   HGBA1C 5.8 04/09/2011          Assessment & Plan:

## 2012-02-16 NOTE — Telephone Encounter (Signed)
Oscar [name], please, inform patient that his CXR was nl. I'll arrange for a Pulmonary consult w/Dr Sherene Sires Thx

## 2012-02-16 NOTE — Assessment & Plan Note (Signed)
CXR was ok Loose wt Pulm cons

## 2012-02-17 NOTE — Telephone Encounter (Signed)
Pt informed

## 2012-03-01 ENCOUNTER — Ambulatory Visit (INDEPENDENT_AMBULATORY_CARE_PROVIDER_SITE_OTHER): Payer: Medicare Other | Admitting: Internal Medicine

## 2012-03-01 ENCOUNTER — Other Ambulatory Visit (INDEPENDENT_AMBULATORY_CARE_PROVIDER_SITE_OTHER): Payer: Medicare Other

## 2012-03-01 ENCOUNTER — Encounter: Payer: Self-pay | Admitting: Internal Medicine

## 2012-03-01 VITALS — BP 120/80 | HR 60 | Temp 98.4°F | Ht 73.0 in | Wt 216.0 lb

## 2012-03-01 DIAGNOSIS — Z23 Encounter for immunization: Secondary | ICD-10-CM

## 2012-03-01 DIAGNOSIS — R0600 Dyspnea, unspecified: Secondary | ICD-10-CM

## 2012-03-01 DIAGNOSIS — R0989 Other specified symptoms and signs involving the circulatory and respiratory systems: Secondary | ICD-10-CM

## 2012-03-01 DIAGNOSIS — R0609 Other forms of dyspnea: Secondary | ICD-10-CM

## 2012-03-01 LAB — CBC WITH DIFFERENTIAL/PLATELET
Basophils Absolute: 0 10*3/uL (ref 0.0–0.1)
Eosinophils Absolute: 0.2 10*3/uL (ref 0.0–0.7)
Lymphocytes Relative: 23.8 % (ref 12.0–46.0)
MCHC: 33.3 g/dL (ref 30.0–36.0)
MCV: 90.1 fl (ref 78.0–100.0)
Monocytes Absolute: 0.7 10*3/uL (ref 0.1–1.0)
Neutrophils Relative %: 66.2 % (ref 43.0–77.0)
Platelets: 274 10*3/uL (ref 150.0–400.0)
RBC: 5.11 Mil/uL (ref 4.22–5.81)
RDW: 13.6 % (ref 11.5–14.6)

## 2012-03-01 LAB — BRAIN NATRIURETIC PEPTIDE: Pro B Natriuretic peptide (BNP): 11 pg/mL (ref 0.0–100.0)

## 2012-03-01 NOTE — Progress Notes (Signed)
  Subjective:    Patient ID: Oscar [name], male    DOB: 1941-05-22  MRN: [id number]  HPI  66  yowm quit smoking 1980 with h/o allergies x decades spring > fall itching sneezing runny nose referred 03/01/2012 to pulmonary clinic by Plotnikov for sob.  03/01/2012 1st pulmonary eval/ Drystan Reader cc acute onset sob immediately whenever bends over sev years ago but then about a year and half prior to OV  Noted progressive decline in ex tol like when helping son move furniture, now gasping for air anytime ties shoes.  Able to walk a half a mile incuding some hills with minimal doe if not bending over. Takes prilosec   before bfast daily. Also fatigue, rarely sore throat assoc with spring and fall weather changes. However,  No obvious daytime variabilty or assoc chronic cough or cp or chest tightness, subjective wheeze overt sinus or hb symptoms. No unusual exp hx or h/o childhood pna/ asthma or premature birth to his knowledge.    Sleeping ok without nocturnal  or early am exacerbation  of respiratory  c/o's or need for noct saba. Also denies any obvious fluctuation of symptoms with weather or environmental changes or other aggravating or alleviating factors except as outlined above     Review of Systems  Constitutional: Negative for fever, chills, activity change, appetite change and unexpected weight change.  HENT: Positive for sore throat. Negative for congestion, rhinorrhea, sneezing, trouble swallowing, dental problem, voice change and postnasal drip.   Eyes: Negative for visual disturbance.  Respiratory: Positive for shortness of breath. Negative for cough and choking.   Cardiovascular: Negative for chest pain and leg swelling.  Gastrointestinal: Negative for nausea, vomiting and abdominal pain.  Genitourinary: Negative for difficulty urinating.  Musculoskeletal: Positive for arthralgias.  Skin: Negative for rash.  Psychiatric/Behavioral: Negative for behavioral problems and confusion.         Objective:   Physical Exam  Amb very hoarse wm nad Wt Readings from Last 3 Encounters:  03/01/12 216 lb (97.977 kg)  02/16/12 218 lb 8 oz (99.111 kg)  09/30/11 211 lb (95.709 kg)   HEENT: nl dentition, turbinates, and orophanx. Nl external ear canals without cough reflex   NECK :  without JVD/Nodes/TM/ nl carotid upstrokes bilaterally   LUNGS: no acc muscle use, clear to A and P bilaterally without cough on insp or exp maneuvers   CV:  RRR  no s3 or murmur or increase in P2, no edema   ABD:  soft and nontender with nl excursion in the supine position. No bruits or organomegaly, bowel sounds nl  MS:  warm without deformities, calf tenderness, cyanosis or clubbing  SKIN: warm and dry without lesions    NEURO:  alert, approp, no deficits    02/16/12  No active cardiopulmonary disease.  03/01/12 labs Nl tsh, bnp Nl hc03 and hct also on file     Assessment & Plan:

## 2012-03-01 NOTE — Progress Notes (Signed)
Quick Note:  Patient returned call. Advised of lab results / recs as stated by MW. Pt verbalized understanding and denied any questions. ______ 

## 2012-03-01 NOTE — Patient Instructions (Addendum)
GERD (REFLUX)  is an extremely common cause of respiratory symptoms, many times with no significant heartburn at all.    It can be treated with medication, but also with lifestyle changes including avoidance of late meals, excessive alcohol, smoking cessation, and avoid fatty foods, chocolate, peppermint, colas, red wine, and acidic juices such as orange juice.  NO MINT OR MENTHOL PRODUCTS SO NO COUGH DROPS  USE SUGARLESS CANDY INSTEAD (jolley ranchers or Stover's)  NO OIL BASED VITAMINS - use powdered substitutes.   Please remember to go to the lab  department downstairs for your tests - we will call you with the results when they are available.  Walking 30 min daily where are continually short of breath but never out of breath  Please schedule a follow up office visit in 4 weeks, sooner if needed with pft's

## 2012-03-02 NOTE — Assessment & Plan Note (Addendum)
Symptoms are markedly disproportionate to objective findings and not clear this is a lung problem but pt does appear to have difficult airway management issues. DDX of  difficult airways managment all start with A and  include Adherence, Ace Inhibitors, Acid Reflux, Active Sinus Disease, Alpha 1 Antitripsin deficiency, Anxiety masquerading as Airways dz,  ABPA,  allergy(esp in young), Aspiration (esp in elderly), Adverse effects of DPI,  Active smokers, plus two Bs  = Bronchiectasis and Beta blocker use..and one C= CHF  In absence of noct symptoms or decrease aerobic capacity this most likely this is nothing more than the effects of abd wt pushing on his diaphragm when he bends over plus possible gerd> reviewed optimal rx/ diet.  Needs f/u with pft's and reconditioning exercises at this point

## 2012-03-07 ENCOUNTER — Encounter: Payer: Self-pay | Admitting: Gastroenterology

## 2012-03-07 ENCOUNTER — Encounter (INDEPENDENT_AMBULATORY_CARE_PROVIDER_SITE_OTHER): Payer: Medicare Other | Admitting: Ophthalmology

## 2012-03-07 DIAGNOSIS — H33309 Unspecified retinal break, unspecified eye: Secondary | ICD-10-CM

## 2012-03-07 DIAGNOSIS — H43819 Vitreous degeneration, unspecified eye: Secondary | ICD-10-CM

## 2012-03-07 DIAGNOSIS — H3530 Unspecified macular degeneration: Secondary | ICD-10-CM

## 2012-03-07 DIAGNOSIS — H2510 Age-related nuclear cataract, unspecified eye: Secondary | ICD-10-CM

## 2012-03-11 ENCOUNTER — Encounter: Payer: Self-pay | Admitting: Cardiovascular Disease

## 2012-03-16 ENCOUNTER — Encounter (INDEPENDENT_AMBULATORY_CARE_PROVIDER_SITE_OTHER): Payer: Medicare Other | Admitting: Ophthalmology

## 2012-03-16 DIAGNOSIS — H33309 Unspecified retinal break, unspecified eye: Secondary | ICD-10-CM

## 2012-03-24 ENCOUNTER — Ambulatory Visit (INDEPENDENT_AMBULATORY_CARE_PROVIDER_SITE_OTHER): Payer: Medicare Other | Admitting: Ophthalmology

## 2012-03-24 DIAGNOSIS — H33309 Unspecified retinal break, unspecified eye: Secondary | ICD-10-CM

## 2012-03-30 ENCOUNTER — Ambulatory Visit: Payer: Medicare Other | Admitting: Internal Medicine

## 2012-03-31 ENCOUNTER — Encounter: Payer: Self-pay | Admitting: Internal Medicine

## 2012-03-31 ENCOUNTER — Ambulatory Visit (INDEPENDENT_AMBULATORY_CARE_PROVIDER_SITE_OTHER): Payer: Medicare Other | Admitting: Internal Medicine

## 2012-03-31 VITALS — BP 130/86 | HR 68 | Temp 98.7°F | Resp 16 | Wt 219.0 lb

## 2012-03-31 DIAGNOSIS — R0989 Other specified symptoms and signs involving the circulatory and respiratory systems: Secondary | ICD-10-CM

## 2012-03-31 DIAGNOSIS — R0609 Other forms of dyspnea: Secondary | ICD-10-CM

## 2012-03-31 DIAGNOSIS — R0600 Dyspnea, unspecified: Secondary | ICD-10-CM

## 2012-03-31 DIAGNOSIS — K219 Gastro-esophageal reflux disease without esophagitis: Secondary | ICD-10-CM

## 2012-03-31 DIAGNOSIS — Z8719 Personal history of other diseases of the digestive system: Secondary | ICD-10-CM

## 2012-03-31 NOTE — Assessment & Plan Note (Signed)
PFTs are pending Better w/BID Omeprazole Cont w/wt loss No signs of diaphragmatic paralysis Keep ROV w/Dr Sherene Sires

## 2012-03-31 NOTE — Assessment & Plan Note (Signed)
Continue with current prescription therapy as reflected on the Med list. EGD is due

## 2012-03-31 NOTE — Assessment & Plan Note (Signed)
EGD is due

## 2012-03-31 NOTE — Progress Notes (Signed)
Patient ID: Oscar [name], male   DOB: [birth year], 71 y.o.   MRN: [id number]   Subjective:    Patient ID: Oscar [name], male    DOB: 11-06-1940, 71 y.o.   MRN: [id number]  Shortness of Breath This is a recurrent problem. The current episode started more than 1 year ago. The problem occurs intermittently. The problem has been gradually improving (better w/BID Omeprazole). The average episode lasts 3 minutes. Pertinent negatives include no chest pain, leg swelling, neck pain, rash or sore throat. Exacerbated by: bending over, diaphragm compression. There is no history of DVT, PE or a recent surgery.  Worse with overeating   The patient presents for a follow-up of  chronic hypertension, chronic dyslipidemia, hypogonadism controlled with medicines  He saw a urologist for hematuria He saw an Ortho for L hip pain due to LBP - in PT now, better  BP Readings from Last 3 Encounters:  03/31/12 130/86  03/01/12 120/80  02/16/12 120/80   Wt Readings from Last 3 Encounters:  03/31/12 219 lb (99.338 kg)  03/01/12 216 lb (97.977 kg)  02/16/12 218 lb 8 oz (99.111 kg)        Review of Systems  Constitutional: Negative for appetite change, fatigue and unexpected weight change.  HENT: Negative for nosebleeds, congestion, sore throat, sneezing, trouble swallowing and neck pain.   Eyes: Negative for itching and visual disturbance.  Respiratory: Positive for shortness of breath. Negative for cough.   Cardiovascular: Negative for chest pain, palpitations and leg swelling.  Gastrointestinal: Negative for nausea, diarrhea, blood in stool and abdominal distention.  Genitourinary: Negative for frequency and hematuria.  Musculoskeletal: Negative for back pain, joint swelling and gait problem.  Skin: Negative for rash.  Neurological: Negative for dizziness, tremors, speech difficulty and weakness.  Psychiatric/Behavioral: Positive for dysphoric mood. Negative for suicidal ideas, sleep disturbance and  agitation. The patient is nervous/anxious.        Objective:   Physical Exam  Constitutional: He is oriented to person, place, and time. He appears well-developed.  HENT:  Mouth/Throat: Oropharynx is clear and moist.  Eyes: Conjunctivae normal are normal. Pupils are equal, round, and reactive to light.  Neck: Normal range of motion. No JVD present. No thyromegaly present.  Cardiovascular: Normal rate, regular rhythm, normal heart sounds and intact distal pulses.  Exam reveals no gallop and no friction rub.   No murmur heard. Pulmonary/Chest: Effort normal and breath sounds normal. No respiratory distress. He has no wheezes. He has no rales. He exhibits no tenderness.  Abdominal: Soft. Bowel sounds are normal. He exhibits no distension and no mass. There is no tenderness. There is no rebound and no guarding.  Musculoskeletal: Normal range of motion. He exhibits no edema and no tenderness.  Lymphadenopathy:    He has no cervical adenopathy.  Neurological: He is alert and oriented to person, place, and time. He has normal reflexes. No cranial nerve deficit. He exhibits normal muscle tone. Coordination normal.  Skin: Skin is warm and dry. No rash noted.  Psychiatric: He has a normal mood and affect. His behavior is normal. Judgment and thought content normal.   Lab Results  Component Value Date   WBC 9.6 03/01/2012   HGB 15.3 03/01/2012   HCT 46.0 03/01/2012   PLT 274.0 03/01/2012   GLUCOSE 83 02/03/2012   CHOL 178 04/09/2011   TRIG 134.0 04/09/2011   HDL 55.80 04/09/2011   LDLDIRECT 140.4 09/12/2010   LDLCALC 95 04/09/2011   ALT 26 04/09/2011  AST 24 04/09/2011   NA 144 02/03/2012   K 4.3 02/03/2012   CL 107 02/03/2012   CREATININE 0.9 02/03/2012   BUN 15 02/03/2012   CO2 25 02/03/2012   TSH 1.48 02/03/2012   PSA 3.21 02/13/2010   HGBA1C 5.8 04/09/2011          Assessment & Plan:

## 2012-04-04 ENCOUNTER — Encounter: Payer: Self-pay | Admitting: Internal Medicine

## 2012-04-04 ENCOUNTER — Encounter: Payer: Self-pay | Admitting: Gastroenterology

## 2012-04-04 ENCOUNTER — Ambulatory Visit (INDEPENDENT_AMBULATORY_CARE_PROVIDER_SITE_OTHER): Payer: Medicare Other | Admitting: Internal Medicine

## 2012-04-04 VITALS — BP 126/80 | HR 58 | Temp 98.3°F | Ht 72.0 in | Wt 220.0 lb

## 2012-04-04 DIAGNOSIS — R0609 Other forms of dyspnea: Secondary | ICD-10-CM

## 2012-04-04 DIAGNOSIS — R0600 Dyspnea, unspecified: Secondary | ICD-10-CM

## 2012-04-04 DIAGNOSIS — R0989 Other specified symptoms and signs involving the circulatory and respiratory systems: Secondary | ICD-10-CM

## 2012-04-04 LAB — PULMONARY FUNCTION TEST

## 2012-04-04 NOTE — Progress Notes (Signed)
PFT done today. 

## 2012-04-04 NOTE — Patient Instructions (Addendum)
Weight control is simply a matter of calorie balance which needs to be tilted in your favor by eating less and exercising more.  To get the most out of exercise, you need to be continuously aware that you are short of breath, but never out of breath, for 30 minutes daily. As you improve, it will actually be easier for you to do the same amount of exercise  in  30 minutes so always push to the level where you are short of breath.  If this does not result in gradual weight reduction then I strongly recommend you see a nutritionist with a food diary x 2 weeks so that we can work out a negative calorie balance which is universally effective in steady weight loss programs.  Think of your calorie balance like you do your bank account where in this case you want the balance to go down so you must take in less calories than you burn up.  It's just that simple:  Hard to do, but easy to understand.  Good luck!   If hoarseness worsens or breathing worsens the next step is ENT evaluation with consideration some day for surgical tightening to prevent reflux if that's what it turns out to be.  Your lungs are excellent shape considering your smoking history and you don't need any respiratory medications at this point

## 2012-04-04 NOTE — Progress Notes (Signed)
  Subjective:    Patient ID: Oscar [name], male    DOB: January 03, 1941   MRN: [id number]  HPI  72 yowm quit smoking 1980 with h/o allergies x decades spring >  fall itching sneezing runny nose referred 03/01/2012 to pulmonary clinic by Plotnikov for sob.  03/01/2012 1st pulmonary eval/ Kristene Liberati cc acute onset sob immediately whenever bends over sev years ago but then about a year and half prior to OV  Noted progressive decline in ex tol like when helping son move furniture, now gasping for air anytime ties shoes.  Able to walk a half a mile including some hills with minimal doe if not bending over. Takes prilosec before bfast daily. Also fatigue, rarely sore throat assoc with spring and fall weather changes.  rec GERD (REFLUX)  diet Walking 30 min daily where are continually short of breath but never out of breath Please schedule a follow up office visit in 4 weeks, sooner if needed with pft's   04/04/2012 f/u ov/Evan Osburn cc breathing some better since the last visit.   No obvious daytime variabilty or assoc chronic cough or cp or chest tightness, subjective wheeze overt sinus or hb symptoms. No unusual exp hx or h/o childhood pna/ asthma or premature birth to his knowledge.    Sleeping ok without nocturnal  or early am exacerbation  of respiratory  c/o's or need for noct saba. Also denies any obvious fluctuation of symptoms with weather or environmental changes or other aggravating or alleviating factors except as outlined above   ROS  The following are not active complaints unless bolded sore throat, dysphagia, dental problems, itching, sneezing,  nasal congestion or excess/ purulent secretions, ear ache,   fever, chills, sweats, unintended wt loss, pleuritic or exertional cp, hemoptysis,  orthopnea pnd or leg swelling, presyncope, palpitations, heartburn, abdominal pain, anorexia, nausea, vomiting, diarrhea  or change in bowel or urinary habits, change in stools or urine, dysuria,hematuria,  rash,  arthralgias, visual complaints, headache, numbness weakness or ataxia or problems with walking or coordination,  change in mood/affect or memory.                Objective:   Physical Exam  Amb mildly hoarse wm nad with freq throat clearing p pfts 04/04/2012 220  Wt Readings from Last 3 Encounters:  03/01/12 216 lb (97.977 kg)  02/16/12 218 lb 8 oz (99.111 kg)  09/30/11 211 lb (95.709 kg)   HEENT: nl dentition, turbinates, and orophanx. Nl external ear canals without cough reflex   NECK :  without JVD/Nodes/TM/ nl carotid upstrokes bilaterally   LUNGS: no acc muscle use, clear to A and P bilaterally without cough on insp or exp maneuvers   CV:  RRR  no s3 or murmur or increase in P2, no edema   ABD:  soft and nontender with nl excursion in the supine position. No bruits or organomegaly, bowel sounds nl  MS:  warm without deformities, calf tenderness, cyanosis or clubbing  SKIN: warm and dry without lesions    NEURO:  alert, approp, no deficits    02/16/12  No active cardiopulmonary disease.  03/01/12 labs Nl tsh, bnp Nl hc03 and hct also on file     Assessment & Plan:

## 2012-04-05 NOTE — Assessment & Plan Note (Signed)
-   PFT's 04/04/2012 FEV1  3.06 (98%) ratio 61 and no better p B2,  DLCO 114  ERV 16%  I had an extended summary discussion with the patient today lasting 15 to 20 minutes of a 25 minute visit on the following issues:   Though he tecnically does have copd, it would be a GOLD 1 and very unlikely to explain his symptoms.  With such a significant reduction in ERV it is most likely this is related to wt gain in the abd compartment and some of his symptoms may well be due to increase pressure in that compartment also (with secondary acid and non-acid gerd) explaining some of his throat clearing issues.  For now simply rec reconditions exercises and perhaps ent eval if throat complaints don't improve over time.

## 2012-05-10 HISTORY — PX: CATARACT EXTRACTION: SUR2

## 2012-05-12 ENCOUNTER — Ambulatory Visit (AMBULATORY_SURGERY_CENTER): Payer: Medicare Other | Admitting: *Deleted

## 2012-05-12 VITALS — Ht 73.0 in | Wt 219.0 lb

## 2012-05-12 DIAGNOSIS — K2270 Barrett's esophagus without dysplasia: Secondary | ICD-10-CM

## 2012-05-26 ENCOUNTER — Encounter: Payer: Medicare Other | Admitting: Gastroenterology

## 2012-05-26 ENCOUNTER — Encounter: Payer: Self-pay | Admitting: Gastroenterology

## 2012-05-26 ENCOUNTER — Ambulatory Visit (AMBULATORY_SURGERY_CENTER): Payer: Medicare Other | Admitting: Gastroenterology

## 2012-05-26 VITALS — BP 142/76 | HR 50 | Temp 96.7°F | Resp 23 | Ht 73.0 in | Wt 219.0 lb

## 2012-05-26 DIAGNOSIS — K2270 Barrett's esophagus without dysplasia: Secondary | ICD-10-CM

## 2012-05-26 HISTORY — PX: ESOPHAGOGASTRODUODENOSCOPY: SHX1529

## 2012-05-26 MED ORDER — SODIUM CHLORIDE 0.9 % IV SOLN: 500.0000 mL | INTRAVENOUS | Status: DC

## 2012-05-26 NOTE — Progress Notes (Signed)
Propofol given over incremental dosages 

## 2012-05-26 NOTE — Op Note (Signed)
[facility] [address] [place], [address]   ENDOSCOPY PROCEDURE REPORT  PATIENT: Oscar, [name]  MR#: [id number] BIRTHDATE: 28-Oct-1940 , 71  yrs. old GENDER: Male ENDOSCOPIST: Meryl Dare, MD, [facility] PROCEDURE DATE:  05/26/2012 PROCEDURE:  EGD w/ biopsy ASA CLASS:     Class II INDICATIONS:  follow up of Barrett's esophagus. MEDICATIONS: MAC sedation, administered by CRNA and propofol (Diprivan) 80mg  IV TOPICAL ANESTHETIC: Cetacaine Spray DESCRIPTION OF PROCEDURE: After the risks benefits and alternatives of the procedure were thoroughly explained, informed consent was obtained.  The LB GIF-H180 [id number] endoscope was introduced through the mouth and advanced to the second portion of the duodenum. Without limitations.  The instrument was slowly withdrawn as the mucosa was fully examined.   ESOPHAGUS: There was evidence of Barrett's esophagus in the lower third of the esophagus.  It measured 4 cm in length. Multiple biopsies were performed using cold forceps.   The esophagus was otherwise normal. STOMACH: The mucosa and folds of the stomach appeared normal. DUODENUM: The duodenal mucosa showed no abnormalities in the bulb and second portion of the duodenum.  Retroflexed views revealed a small hiatal hernia.   The scope was then withdrawn from the patient and the procedure completed.  COMPLICATIONS: There were no complications.  ENDOSCOPIC IMPRESSION: 1.   Barrett's esophagus; multiple biopsies 2.   Small hiatal hernia  RECOMMENDATIONS: 1.  Anti-reflux regimen 2.  Await pathology results 3.  Continue PPI 4.  Endoscopy in 3 years if no dysplasia    eSigned:  Meryl Dare, MD, [facility] 05/26/2012 11:05 AM

## 2012-05-26 NOTE — Progress Notes (Signed)
Called to room to assist during endoscopic procedure.  Patient ID and intended procedure confirmed with present staff. Received instructions for my participation in the procedure from the performing physician.  

## 2012-05-26 NOTE — Patient Instructions (Addendum)
YOU HAD AN ENDOSCOPIC PROCEDURE TODAY AT THE [facility]: Refer to the procedure report that was given to you for any specific questions about what was found during the examination.  If the procedure report does not answer your questions, please call your gastroenterologist to clarify.  If you requested that your care partner not be given the details of your procedure findings, then the procedure report has been included in a sealed envelope for you to review at your convenience later.  YOU SHOULD EXPECT: Some feelings of bloating in the abdomen. Passage of more gas than usual.  Walking can help get rid of the air that was put into your GI tract during the procedure and reduce the bloating. If you had a lower endoscopy (such as a colonoscopy or flexible sigmoidoscopy) you may notice spotting of blood in your stool or on the toilet paper. If you underwent a bowel prep for your procedure, then you may not have a normal bowel movement for a few days.  DIET: Your first meal following the procedure should be a light meal and then it is ok to progress to your normal diet.  A half-sandwich or bowl of soup is an example of a good first meal.  Heavy or fried foods are harder to digest and may make you feel nauseous or bloated.  Likewise meals heavy in dairy and vegetables can cause extra gas to form and this can also increase the bloating.  Drink plenty of fluids but you should avoid alcoholic beverages for 24 hours.  ACTIVITY: Your care partner should take you home directly after the procedure.  You should plan to take it easy, moving slowly for the rest of the day.  You can resume normal activity the day after the procedure however you should NOT DRIVE or use heavy machinery for 24 hours (because of the sedation medicines used during the test).    SYMPTOMS TO REPORT IMMEDIATELY: A gastroenterologist can be reached at any hour.  During normal business hours, 8:30 AM to 5:00 PM Monday through Friday,  call [phone].  After hours and on weekends, please call the GI answering service at [phone] who will take a message and have the physician on call contact you.     Following upper endoscopy (EGD)  Vomiting of blood or coffee ground material  New chest pain or pain under the shoulder blades  Painful or persistently difficult swallowing  New shortness of breath  Fever of 100F or higher  Black, tarry-looking stools  FOLLOW UP: If any biopsies were taken you will be contacted by phone or by letter within the next 1-3 weeks.  Call your gastroenterologist if you have not heard about the biopsies in 3 weeks.  Our staff will call the home number listed on your records the next business day following your procedure to check on you and address any questions or concerns that you may have at that time regarding the information given to you following your procedure. This is a courtesy call and so if there is no answer at the home number and we have not heard from you through the emergency physician on call, we will assume that you have returned to your regular daily activities without incident.   Information on Barretts Esophagus given to you today  Information on hiatal hernia as well as anti reflux regimen to follow given to you today  Endoscopy in 3 yrs if no dysplasia  Continue anti reflux medication  SIGNATURES/CONFIDENTIALITY: You and/or your care partner have signed paperwork which will be entered into your electronic medical record.  These signatures attest to the fact that that the information above on your After Visit Summary has been reviewed and is understood.  Full responsibility of the confidentiality of this discharge information lies with you and/or your care-partner.

## 2012-05-26 NOTE — Progress Notes (Signed)
Patient did not experience any of the following events: a burn prior to discharge; a fall within the facility; wrong site/side/patient/procedure/implant event; or a hospital transfer or hospital admission upon discharge from the facility. (G8907) Patient did not have preoperative order for IV antibiotic SSI prophylaxis. (G8918)  

## 2012-05-27 ENCOUNTER — Telehealth: Payer: Self-pay | Admitting: *Deleted

## 2012-05-27 NOTE — Telephone Encounter (Signed)
  Follow up Call-  Call back number 05/26/2012  Post procedure Call Back phone  # [phone]  Permission to leave phone message Yes     Patient questions:  Do you have a fever, pain , or abdominal swelling? no Pain Score  0 *  Have you tolerated food without any problems? yes  Have you been able to return to your normal activities? yes  Do you have any questions about your discharge instructions: Diet   no Medications  no Follow up visit  no  Do you have questions or concerns about your Care? no  Actions: * If pain score is 4 or above: No action needed, pain <4.

## 2012-05-31 ENCOUNTER — Encounter: Payer: Self-pay | Admitting: Gastroenterology

## 2012-07-28 ENCOUNTER — Ambulatory Visit (INDEPENDENT_AMBULATORY_CARE_PROVIDER_SITE_OTHER): Payer: Medicare Other | Admitting: Ophthalmology

## 2012-07-29 ENCOUNTER — Ambulatory Visit: Payer: Medicare Other | Admitting: Internal Medicine

## 2012-08-08 ENCOUNTER — Ambulatory Visit (INDEPENDENT_AMBULATORY_CARE_PROVIDER_SITE_OTHER): Payer: Medicare Other | Admitting: Ophthalmology

## 2012-08-08 DIAGNOSIS — H43819 Vitreous degeneration, unspecified eye: Secondary | ICD-10-CM

## 2012-08-08 DIAGNOSIS — H33309 Unspecified retinal break, unspecified eye: Secondary | ICD-10-CM

## 2012-08-08 DIAGNOSIS — H3530 Unspecified macular degeneration: Secondary | ICD-10-CM

## 2012-08-13 ENCOUNTER — Other Ambulatory Visit: Payer: Self-pay | Admitting: Internal Medicine

## 2012-08-15 ENCOUNTER — Encounter: Payer: Self-pay | Admitting: Internal Medicine

## 2012-08-15 ENCOUNTER — Ambulatory Visit (INDEPENDENT_AMBULATORY_CARE_PROVIDER_SITE_OTHER): Payer: Medicare Other | Admitting: Internal Medicine

## 2012-08-15 VITALS — BP 140/80 | HR 80 | Temp 98.4°F | Resp 16 | Wt 216.0 lb

## 2012-08-15 DIAGNOSIS — K219 Gastro-esophageal reflux disease without esophagitis: Secondary | ICD-10-CM

## 2012-08-15 DIAGNOSIS — F329 Major depressive disorder, single episode, unspecified: Secondary | ICD-10-CM

## 2012-08-15 DIAGNOSIS — F411 Generalized anxiety disorder: Secondary | ICD-10-CM

## 2012-08-15 DIAGNOSIS — R0609 Other forms of dyspnea: Secondary | ICD-10-CM

## 2012-08-15 DIAGNOSIS — R0600 Dyspnea, unspecified: Secondary | ICD-10-CM

## 2012-08-15 DIAGNOSIS — J069 Acute upper respiratory infection, unspecified: Secondary | ICD-10-CM

## 2012-08-15 MED ORDER — PROMETHAZINE-CODEINE 6.25-10 MG/5ML PO SYRP: 5.0000 mL | ORAL_SOLUTION | ORAL | Status: DC | PRN

## 2012-08-15 MED ORDER — ATORVASTATIN CALCIUM 40 MG PO TABS: 40.0000 mg | ORAL_TABLET | Freq: Every day | ORAL | Status: DC

## 2012-08-15 MED ORDER — DOXYCYCLINE HYCLATE 100 MG PO TABS: 100.0000 mg | ORAL_TABLET | Freq: Two times a day (BID) | ORAL | Status: DC

## 2012-08-15 NOTE — Assessment & Plan Note (Signed)
Continue with current prescription therapy as reflected on the Med list.  

## 2012-08-15 NOTE — Progress Notes (Signed)
Subjective:    Patient ID: Oscar [name], male    DOB: Oct 13, 1940, 72 y.o.   MRN: [id number]  Shortness of Breath This is a recurrent problem. The current episode started more than 1 year ago. The problem occurs intermittently. The problem has been unchanged (better w/BID Omeprazole). The average episode lasts 3 minutes. Associated symptoms include a sore throat. Pertinent negatives include no chest pain, leg swelling, neck pain or rash. Exacerbated by: bending over, diaphragm compression. There is no history of DVT, PE or a recent surgery.  Sinusitis This is a new problem. The current episode started 1 to 4 weeks ago. There has been no fever. The pain is mild. Associated symptoms include coughing, shortness of breath, sinus pressure and a sore throat. Pertinent negatives include no congestion, neck pain or sneezing. Past treatments include nothing. The treatment provided no relief.  Worse with overeating   The patient presents for a follow-up of  chronic hypertension, chronic dyslipidemia, hypogonadism controlled with medicines  He saw a urologist for hematuria He saw an Ortho for L hip pain due to LBP - in PT now, better  BP Readings from Last 3 Encounters:  08/15/12 140/80  05/26/12 142/76  04/04/12 126/80   Wt Readings from Last 3 Encounters:  08/15/12 216 lb (97.977 kg)  05/26/12 219 lb (99.338 kg)  05/12/12 219 lb (99.338 kg)        Review of Systems  Constitutional: Negative for appetite change, fatigue and unexpected weight change.  HENT: Positive for sore throat and sinus pressure. Negative for nosebleeds, congestion, sneezing, trouble swallowing and neck pain.   Eyes: Negative for itching and visual disturbance.  Respiratory: Positive for cough and shortness of breath.   Cardiovascular: Negative for chest pain, palpitations and leg swelling.  Gastrointestinal: Negative for nausea, diarrhea, blood in stool and abdominal distention.  Genitourinary: Negative for  frequency and hematuria.  Musculoskeletal: Negative for back pain, joint swelling and gait problem.  Skin: Negative for rash.  Neurological: Negative for dizziness, tremors, speech difficulty and weakness.  Psychiatric/Behavioral: Positive for dysphoric mood. Negative for suicidal ideas, sleep disturbance and agitation. The patient is nervous/anxious.        Objective:   Physical Exam  Constitutional: He is oriented to person, place, and time. He appears well-developed.  HENT:  Mouth/Throat: Oropharynx is clear and moist. No oropharyngeal exudate.  Swollen nasal mucosa  Eyes: Conjunctivae are normal. Pupils are equal, round, and reactive to light.  Neck: Normal range of motion. No JVD present. No thyromegaly present.  Cardiovascular: Normal rate, regular rhythm, normal heart sounds and intact distal pulses.  Exam reveals no gallop and no friction rub.   No murmur heard. Pulmonary/Chest: Effort normal and breath sounds normal. No respiratory distress. He has no wheezes. He has no rales. He exhibits no tenderness.  Abdominal: Soft. Bowel sounds are normal. He exhibits no distension and no mass. There is no tenderness. There is no rebound and no guarding.  Musculoskeletal: Normal range of motion. He exhibits no edema and no tenderness.  Lymphadenopathy:    He has no cervical adenopathy.  Neurological: He is alert and oriented to person, place, and time. He has normal reflexes. No cranial nerve deficit. He exhibits normal muscle tone. Coordination normal.  Skin: Skin is warm and dry. No rash noted.  Psychiatric: He has a normal mood and affect. His behavior is normal. Judgment and thought content normal.   Lab Results  Component Value Date   WBC 9.6 03/01/2012  HGB 15.3 03/01/2012   HCT 46.0 03/01/2012   PLT 274.0 03/01/2012   GLUCOSE 83 02/03/2012   CHOL 178 04/09/2011   TRIG 134.0 04/09/2011   HDL 55.80 04/09/2011   LDLDIRECT 140.4 09/12/2010   LDLCALC 95 04/09/2011   ALT 26  04/09/2011   AST 24 04/09/2011   NA 144 02/03/2012   K 4.3 02/03/2012   CL 107 02/03/2012   CREATININE 0.9 02/03/2012   BUN 15 02/03/2012   CO2 25 02/03/2012   TSH 1.48 02/03/2012   PSA 3.21 02/13/2010   HGBA1C 5.8 04/09/2011          Assessment & Plan:

## 2012-08-15 NOTE — Assessment & Plan Note (Signed)
Doxy Prom-cod syr

## 2012-08-15 NOTE — Assessment & Plan Note (Signed)
No change 

## 2012-12-15 ENCOUNTER — Encounter: Payer: Self-pay | Admitting: Internal Medicine

## 2012-12-15 ENCOUNTER — Ambulatory Visit (INDEPENDENT_AMBULATORY_CARE_PROVIDER_SITE_OTHER): Payer: Self-pay | Admitting: Internal Medicine

## 2012-12-15 VITALS — BP 142/80 | HR 76 | Temp 98.4°F | Resp 16 | Wt 217.0 lb

## 2012-12-15 DIAGNOSIS — Z0000 Encounter for general adult medical examination without abnormal findings: Secondary | ICD-10-CM

## 2012-12-15 DIAGNOSIS — H6120 Impacted cerumen, unspecified ear: Secondary | ICD-10-CM

## 2012-12-15 DIAGNOSIS — H6123 Impacted cerumen, bilateral: Secondary | ICD-10-CM

## 2012-12-15 DIAGNOSIS — N529 Male erectile dysfunction, unspecified: Secondary | ICD-10-CM

## 2012-12-15 DIAGNOSIS — E785 Hyperlipidemia, unspecified: Secondary | ICD-10-CM

## 2012-12-15 DIAGNOSIS — N320 Bladder-neck obstruction: Secondary | ICD-10-CM

## 2012-12-15 DIAGNOSIS — F329 Major depressive disorder, single episode, unspecified: Secondary | ICD-10-CM

## 2012-12-15 DIAGNOSIS — E291 Testicular hypofunction: Secondary | ICD-10-CM

## 2012-12-15 NOTE — Assessment & Plan Note (Signed)
Continue with current prn prescription therapy as reflected on the Med list.  

## 2012-12-15 NOTE — Assessment & Plan Note (Signed)
Continue with current prescription therapy as reflected on the Med list.  

## 2012-12-15 NOTE — Assessment & Plan Note (Signed)
See procedure 

## 2012-12-15 NOTE — Progress Notes (Signed)
Patient ID: Oscar [name], male   DOB: 12/03/1940, 72 y.o.   MRN: [id number]   Subjective:    Patient ID: Oscar [name], male    DOB: [birth year], 72 y.o.   MRN: [id number]  HPI  The patient presents for a follow-up of  chronic hypertension, chronic dyslipidemia, hypogonadism controlled with medicines  He saw a urologist for hematuria He saw an Ortho for L hip pain due to LBP - in PT now, better  BP Readings from Last 3 Encounters:  12/15/12 142/80  08/15/12 140/80  05/26/12 142/76   Wt Readings from Last 3 Encounters:  12/15/12 217 lb (98.431 kg)  08/15/12 216 lb (97.977 kg)  05/26/12 219 lb (99.338 kg)        Review of Systems  Constitutional: Negative for appetite change, fatigue and unexpected weight change.  HENT: Negative for nosebleeds and trouble swallowing.   Eyes: Negative for itching and visual disturbance.  Cardiovascular: Negative for palpitations.  Gastrointestinal: Negative for nausea, diarrhea, blood in stool and abdominal distention.  Genitourinary: Negative for frequency and hematuria.  Musculoskeletal: Negative for back pain, joint swelling and gait problem.  Neurological: Negative for dizziness, tremors, speech difficulty and weakness.  Psychiatric/Behavioral: Positive for dysphoric mood. Negative for suicidal ideas, sleep disturbance and agitation. The patient is nervous/anxious.        Objective:   Physical Exam  Constitutional: He is oriented to person, place, and time. He appears well-developed. No distress.  Obese   HENT:  Mouth/Throat: Oropharynx is clear and moist. No oropharyngeal exudate.  Swollen nasal mucosa  Eyes: Conjunctivae are normal. Pupils are equal, round, and reactive to light.  Neck: Normal range of motion. No JVD present. No thyromegaly present.  Cardiovascular: Normal rate, regular rhythm, normal heart sounds and intact distal pulses.  Exam reveals no gallop and no friction rub.   No murmur heard. Pulmonary/Chest: Effort  normal and breath sounds normal. No respiratory distress. He has no wheezes. He has no rales. He exhibits no tenderness.  Abdominal: Soft. Bowel sounds are normal. He exhibits no distension and no mass. There is no tenderness. There is no rebound and no guarding.  Musculoskeletal: Normal range of motion. He exhibits no edema and no tenderness.  Lymphadenopathy:    He has no cervical adenopathy.  Neurological: He is alert and oriented to person, place, and time. He has normal reflexes. No cranial nerve deficit. He exhibits normal muscle tone. Coordination normal.  Skin: Skin is warm and dry. No rash noted.  Psychiatric: He has a normal mood and affect. His behavior is normal. Judgment and thought content normal.  wax B Lab Results  Component Value Date   WBC 9.6 03/01/2012   HGB 15.3 03/01/2012   HCT 46.0 03/01/2012   PLT 274.0 03/01/2012   GLUCOSE 83 02/03/2012   CHOL 178 04/09/2011   TRIG 134.0 04/09/2011   HDL 55.80 04/09/2011   LDLDIRECT 140.4 09/12/2010   LDLCALC 95 04/09/2011   ALT 26 04/09/2011   AST 24 04/09/2011   NA 144 02/03/2012   K 4.3 02/03/2012   CL 107 02/03/2012   CREATININE 0.9 02/03/2012   BUN 15 02/03/2012   CO2 25 02/03/2012   TSH 1.48 02/03/2012   PSA 3.21 02/13/2010   HGBA1C 5.8 04/09/2011     Procedure Note :     Procedure :  Ear irrigation   Indication:  Cerumen impaction B   Risks, including pain, dizziness, eardrum perforation, bleeding, infection and others as well as  benefits were explained to the patient in detail. Verbal consent was obtained and the patient agreed to proceed.    We used "The Elephant Ear Irrigation Device" filled with lukewarm water for irrigation. A large amount wax was recovered. Procedure has also required manual wax removal with an ear loop.   Tolerated well. Complications: None.   Postprocedure instructions :  Call if problems.        Assessment & Plan:

## 2013-03-16 ENCOUNTER — Ambulatory Visit (INDEPENDENT_AMBULATORY_CARE_PROVIDER_SITE_OTHER): Payer: Medicare Other

## 2013-03-16 DIAGNOSIS — Z23 Encounter for immunization: Secondary | ICD-10-CM

## 2013-04-12 ENCOUNTER — Other Ambulatory Visit (INDEPENDENT_AMBULATORY_CARE_PROVIDER_SITE_OTHER): Payer: Medicare Other

## 2013-04-12 DIAGNOSIS — F3289 Other specified depressive episodes: Secondary | ICD-10-CM

## 2013-04-12 DIAGNOSIS — N529 Male erectile dysfunction, unspecified: Secondary | ICD-10-CM

## 2013-04-12 DIAGNOSIS — N320 Bladder-neck obstruction: Secondary | ICD-10-CM

## 2013-04-12 DIAGNOSIS — E291 Testicular hypofunction: Secondary | ICD-10-CM

## 2013-04-12 DIAGNOSIS — Z0000 Encounter for general adult medical examination without abnormal findings: Secondary | ICD-10-CM

## 2013-04-12 DIAGNOSIS — F329 Major depressive disorder, single episode, unspecified: Secondary | ICD-10-CM

## 2013-04-12 DIAGNOSIS — E785 Hyperlipidemia, unspecified: Secondary | ICD-10-CM

## 2013-04-12 LAB — CBC WITH DIFFERENTIAL/PLATELET
Basophils Absolute: 0.1 10*3/uL (ref 0.0–0.1)
Basophils Relative: 0.7 % (ref 0.0–3.0)
Eosinophils Absolute: 0.2 10*3/uL (ref 0.0–0.7)
HCT: 43.2 % (ref 39.0–52.0)
Hemoglobin: 14.9 g/dL (ref 13.0–17.0)
Lymphocytes Relative: 24.9 % (ref 12.0–46.0)
Lymphs Abs: 2.2 10*3/uL (ref 0.7–4.0)
MCHC: 34.5 g/dL (ref 30.0–36.0)
MCV: 87.3 fl (ref 78.0–100.0)
Monocytes Absolute: 0.7 10*3/uL (ref 0.1–1.0)
Monocytes Relative: 8.2 % (ref 3.0–12.0)
Neutro Abs: 5.8 10*3/uL (ref 1.4–7.7)
RDW: 13.3 % (ref 11.5–14.6)

## 2013-04-12 LAB — HEPATIC FUNCTION PANEL
ALT: 30 U/L (ref 0–53)
AST: 25 U/L (ref 0–37)
Alkaline Phosphatase: 76 U/L (ref 39–117)
Bilirubin, Direct: 0.1 mg/dL (ref 0.0–0.3)
Total Bilirubin: 1.2 mg/dL (ref 0.3–1.2)
Total Protein: 7 g/dL (ref 6.0–8.3)

## 2013-04-12 LAB — URINALYSIS
Hgb urine dipstick: NEGATIVE
Total Protein, Urine: NEGATIVE
Urine Glucose: NEGATIVE
Urobilinogen, UA: 0.2 (ref 0.0–1.0)

## 2013-04-12 LAB — LIPID PANEL: VLDL: 40.8 mg/dL — ABNORMAL HIGH (ref 0.0–40.0)

## 2013-04-12 LAB — BASIC METABOLIC PANEL
CO2: 30 mEq/L (ref 19–32)
Calcium: 9.5 mg/dL (ref 8.4–10.5)
Chloride: 104 mEq/L (ref 96–112)
GFR: 104.11 mL/min (ref >60.00)
Glucose, Bld: 83 mg/dL (ref 70–99)
Sodium: 138 mEq/L (ref 135–145)

## 2013-04-12 LAB — TESTOSTERONE: Testosterone: 141.45 ng/dL — ABNORMAL LOW (ref 350.00–890.00)

## 2013-04-17 ENCOUNTER — Ambulatory Visit (INDEPENDENT_AMBULATORY_CARE_PROVIDER_SITE_OTHER): Payer: Medicare Other | Admitting: Internal Medicine

## 2013-04-17 ENCOUNTER — Encounter: Payer: Self-pay | Admitting: Internal Medicine

## 2013-04-17 VITALS — BP 150/82 | HR 72 | Temp 97.9°F | Resp 16 | Ht 73.0 in | Wt 221.0 lb

## 2013-04-17 DIAGNOSIS — F329 Major depressive disorder, single episode, unspecified: Secondary | ICD-10-CM

## 2013-04-17 DIAGNOSIS — Z79899 Other long term (current) drug therapy: Secondary | ICD-10-CM

## 2013-04-17 DIAGNOSIS — Z0000 Encounter for general adult medical examination without abnormal findings: Secondary | ICD-10-CM | POA: Insufficient documentation

## 2013-04-17 DIAGNOSIS — I10 Essential (primary) hypertension: Secondary | ICD-10-CM

## 2013-04-17 MED ORDER — LOSARTAN POTASSIUM 100 MG PO TABS: 100.0000 mg | ORAL_TABLET | Freq: Every day | ORAL | Status: DC

## 2013-04-17 MED ORDER — VITAMIN D 1000 UNITS PO TABS: 1000.0000 [IU] | ORAL_TABLET | Freq: Every day | ORAL | Status: AC

## 2013-04-17 MED ORDER — VARDENAFIL HCL 20 MG PO TABS: 10.0000 mg | ORAL_TABLET | Freq: Every day | ORAL | Status: DC | PRN

## 2013-04-17 NOTE — Assessment & Plan Note (Signed)

## 2013-04-17 NOTE — Assessment & Plan Note (Signed)
Continue with current prescription therapy as reflected on the Med list.  

## 2013-04-17 NOTE — Progress Notes (Signed)
   Subjective:    Patient ID: Oscar [name], male    DOB: 09/02/1940, 72 y.o.   MRN: [id number]  HPI  The patient is here for a wellness exam. The patient has been doing well overall without major physical or psychological issues going on lately.  The patient presents for a follow-up of  chronic hypertension, chronic dyslipidemia, hypogonadism controlled with medicines  He saw a urologist for hematuria He saw an Ortho for L hip pain due to LBP - in PT now, better  BP Readings from Last 3 Encounters:  04/17/13 150/82  12/15/12 142/80  08/15/12 140/80   Wt Readings from Last 3 Encounters:  04/17/13 221 lb (100.245 kg)  12/15/12 217 lb (98.431 kg)  08/15/12 216 lb (97.977 kg)        Review of Systems  Constitutional: Negative for appetite change, fatigue and unexpected weight change.  HENT: Negative for nosebleeds and trouble swallowing.   Eyes: Negative for itching and visual disturbance.  Cardiovascular: Negative for palpitations.  Gastrointestinal: Negative for nausea, diarrhea, blood in stool and abdominal distention.  Genitourinary: Negative for frequency and hematuria.  Musculoskeletal: Negative for back pain, gait problem and joint swelling.  Neurological: Negative for dizziness, tremors, speech difficulty and weakness.  Psychiatric/Behavioral: Positive for dysphoric mood. Negative for suicidal ideas, sleep disturbance and agitation. The patient is nervous/anxious.        Objective:   Physical Exam  Constitutional: He is oriented to person, place, and time. He appears well-developed. No distress.  Obese   HENT:  Mouth/Throat: Oropharynx is clear and moist. No oropharyngeal exudate.  Swollen nasal mucosa  Eyes: Conjunctivae are normal. Pupils are equal, round, and reactive to light.  Neck: Normal range of motion. No JVD present. No thyromegaly present.  Cardiovascular: Normal rate, regular rhythm, normal heart sounds and intact distal pulses.  Exam reveals no  gallop and no friction rub.   No murmur heard. Pulmonary/Chest: Effort normal and breath sounds normal. No respiratory distress. He has no wheezes. He has no rales. He exhibits no tenderness.  Abdominal: Soft. Bowel sounds are normal. He exhibits no distension and no mass. There is no tenderness. There is no rebound and no guarding.  Musculoskeletal: Normal range of motion. He exhibits no edema and no tenderness.  Lymphadenopathy:    He has no cervical adenopathy.  Neurological: He is alert and oriented to person, place, and time. He has normal reflexes. No cranial nerve deficit. He exhibits normal muscle tone. Coordination normal.  Skin: Skin is warm and dry. No rash noted.  Psychiatric: He has a normal mood and affect. His behavior is normal. Judgment and thought content normal.  Rectal per Urol 1 mo ago  Lab Results  Component Value Date   WBC 9.0 04/12/2013   HGB 14.9 04/12/2013   HCT 43.2 04/12/2013   PLT 265.0 04/12/2013   GLUCOSE 83 04/12/2013   CHOL 190 04/12/2013   TRIG 204.0* 04/12/2013   HDL 63.60 04/12/2013   LDLDIRECT 107.8 04/12/2013   LDLCALC 95 04/09/2011   ALT 30 04/12/2013   AST 25 04/12/2013   NA 138 04/12/2013   K 4.7 04/12/2013   CL 104 04/12/2013   CREATININE 0.8 04/12/2013   BUN 15 04/12/2013   CO2 30 04/12/2013   TSH 1.41 04/12/2013   PSA 4.46* 04/12/2013   HGBA1C 5.8 04/09/2011              Assessment & Plan:

## 2013-04-17 NOTE — Patient Instructions (Signed)

## 2013-04-17 NOTE — Progress Notes (Signed)
Pre visit review using our clinic review tool, if applicable. No additional management support is needed unless otherwise documented below in the visit note. 

## 2013-04-20 ENCOUNTER — Encounter: Payer: Self-pay | Admitting: Internal Medicine

## 2013-08-09 ENCOUNTER — Ambulatory Visit (INDEPENDENT_AMBULATORY_CARE_PROVIDER_SITE_OTHER): Payer: Medicare Other | Admitting: Ophthalmology

## 2013-08-09 DIAGNOSIS — H3530 Unspecified macular degeneration: Secondary | ICD-10-CM

## 2013-08-09 DIAGNOSIS — H33309 Unspecified retinal break, unspecified eye: Secondary | ICD-10-CM

## 2013-08-09 DIAGNOSIS — H43819 Vitreous degeneration, unspecified eye: Secondary | ICD-10-CM

## 2013-08-15 ENCOUNTER — Ambulatory Visit (INDEPENDENT_AMBULATORY_CARE_PROVIDER_SITE_OTHER): Payer: Medicare Other | Admitting: Internal Medicine

## 2013-08-15 ENCOUNTER — Encounter: Payer: Self-pay | Admitting: Internal Medicine

## 2013-08-15 VITALS — BP 142/64 | HR 68 | Temp 97.8°F | Resp 16 | Wt 223.0 lb

## 2013-08-15 DIAGNOSIS — E291 Testicular hypofunction: Secondary | ICD-10-CM

## 2013-08-15 DIAGNOSIS — Z0000 Encounter for general adult medical examination without abnormal findings: Secondary | ICD-10-CM

## 2013-08-15 DIAGNOSIS — Z23 Encounter for immunization: Secondary | ICD-10-CM

## 2013-08-15 DIAGNOSIS — Z136 Encounter for screening for cardiovascular disorders: Secondary | ICD-10-CM

## 2013-08-15 DIAGNOSIS — D126 Benign neoplasm of colon, unspecified: Secondary | ICD-10-CM

## 2013-08-15 NOTE — Assessment & Plan Note (Signed)

## 2013-08-15 NOTE — Progress Notes (Signed)
Pre visit review using our clinic review tool, if applicable. No additional management support is needed unless otherwise documented below in the visit note. 

## 2013-08-15 NOTE — Progress Notes (Signed)
   Subjective:    Patient ID: Oscar [name], male    DOB: Oct 07, 1940, 73 y.o.   MRN: [id number]  HPI  The patient is here for a wellness exam. The patient has been doing well overall without major physical or psychological issues going on lately.  The patient presents for a follow-up of  chronic hypertension, chronic dyslipidemia, hypogonadism controlled with medicines  He saw a urologist for hematuria He saw an Ortho for L hip pain due to LBP - in PT now, better  BP Readings from Last 3 Encounters:  08/15/13 142/64  04/17/13 150/82  12/15/12 142/80   Wt Readings from Last 3 Encounters:  08/15/13 223 lb (101.152 kg)  04/17/13 221 lb (100.245 kg)  12/15/12 217 lb (98.431 kg)        Review of Systems  Constitutional: Negative for appetite change, fatigue and unexpected weight change.  HENT: Negative for nosebleeds and trouble swallowing.   Eyes: Negative for itching and visual disturbance.  Cardiovascular: Negative for palpitations.  Gastrointestinal: Negative for nausea, diarrhea, blood in stool and abdominal distention.  Genitourinary: Negative for frequency and hematuria.  Musculoskeletal: Negative for back pain, gait problem and joint swelling.  Neurological: Negative for dizziness, tremors, speech difficulty and weakness.  Psychiatric/Behavioral: Positive for dysphoric mood. Negative for suicidal ideas, sleep disturbance and agitation. The patient is nervous/anxious.        Objective:   Physical Exam  Constitutional: He is oriented to person, place, and time. He appears well-developed. No distress.  Obese   HENT:  Mouth/Throat: Oropharynx is clear and moist. No oropharyngeal exudate.  Swollen nasal mucosa  Eyes: Conjunctivae are normal. Pupils are equal, round, and reactive to light.  Neck: Normal range of motion. No JVD present. No thyromegaly present.  Cardiovascular: Normal rate, regular rhythm, normal heart sounds and intact distal pulses.  Exam reveals no  gallop and no friction rub.   No murmur heard. Pulmonary/Chest: Effort normal and breath sounds normal. No respiratory distress. He has no wheezes. He has no rales. He exhibits no tenderness.  Abdominal: Soft. Bowel sounds are normal. He exhibits no distension and no mass. There is no tenderness. There is no rebound and no guarding.  Musculoskeletal: Normal range of motion. He exhibits no edema and no tenderness.  Lymphadenopathy:    He has no cervical adenopathy.  Neurological: He is alert and oriented to person, place, and time. He has normal reflexes. No cranial nerve deficit. He exhibits normal muscle tone. Coordination normal.  Skin: Skin is warm and dry. No rash noted.  Psychiatric: He has a normal mood and affect. His behavior is normal. Judgment and thought content normal.  Rectal per Urol 1 mo ago  Lab Results  Component Value Date   WBC 9.0 04/12/2013   HGB 14.9 04/12/2013   HCT 43.2 04/12/2013   PLT 265.0 04/12/2013   GLUCOSE 83 04/12/2013   CHOL 190 04/12/2013   TRIG 204.0* 04/12/2013   HDL 63.60 04/12/2013   LDLDIRECT 107.8 04/12/2013   LDLCALC 95 04/09/2011   ALT 30 04/12/2013   AST 25 04/12/2013   NA 138 04/12/2013   K 4.7 04/12/2013   CL 104 04/12/2013   CREATININE 0.8 04/12/2013   BUN 15 04/12/2013   CO2 30 04/12/2013   TSH 1.41 04/12/2013   PSA 4.46* 04/12/2013   HGBA1C 5.8 04/09/2011              Assessment & Plan:

## 2013-08-21 ENCOUNTER — Other Ambulatory Visit (INDEPENDENT_AMBULATORY_CARE_PROVIDER_SITE_OTHER): Payer: Medicare Other

## 2013-08-21 DIAGNOSIS — E785 Hyperlipidemia, unspecified: Secondary | ICD-10-CM

## 2013-08-21 DIAGNOSIS — Z0000 Encounter for general adult medical examination without abnormal findings: Secondary | ICD-10-CM

## 2013-08-21 LAB — URINALYSIS
Bilirubin Urine: NEGATIVE
Hgb urine dipstick: NEGATIVE
Ketones, ur: NEGATIVE
Leukocytes, UA: NEGATIVE
NITRITE: NEGATIVE
PH: 7 (ref 5.0–8.0)
Specific Gravity, Urine: 1.01 (ref 1.000–1.030)
TOTAL PROTEIN, URINE-UPE24: NEGATIVE
Urine Glucose: NEGATIVE
Urobilinogen, UA: 0.2 (ref 0.0–1.0)

## 2013-08-21 LAB — HEPATIC FUNCTION PANEL
ALT: 26 U/L (ref 0–53)
AST: 20 U/L (ref 0–37)
Albumin: 4.3 g/dL (ref 3.5–5.2)
Alkaline Phosphatase: 84 U/L (ref 39–117)
BILIRUBIN DIRECT: 0.1 mg/dL (ref 0.0–0.3)
BILIRUBIN TOTAL: 0.9 mg/dL (ref 0.3–1.2)
Total Protein: 7.3 g/dL (ref 6.0–8.3)

## 2013-08-21 LAB — LIPID PANEL
Cholesterol: 197 mg/dL (ref 0–200)
HDL: 57 mg/dL (ref >39.00)
LDL Cholesterol: 94 mg/dL (ref 0–99)
Total CHOL/HDL Ratio: 3
Triglycerides: 229 mg/dL — ABNORMAL HIGH (ref 0.0–149.0)
VLDL: 45.8 mg/dL — ABNORMAL HIGH (ref 0.0–40.0)

## 2013-08-21 LAB — TSH: TSH: 1.88 u[IU]/mL (ref 0.35–5.50)

## 2013-08-21 LAB — BASIC METABOLIC PANEL
BUN: 11 mg/dL (ref 6–23)
CALCIUM: 9.3 mg/dL (ref 8.4–10.5)
CO2: 27 meq/L (ref 19–32)
Chloride: 103 mEq/L (ref 96–112)
Creatinine, Ser: 0.9 mg/dL (ref 0.4–1.5)
GFR: 83.86 mL/min (ref >60.00)
Glucose, Bld: 106 mg/dL — ABNORMAL HIGH (ref 70–99)
Potassium: 4.5 mEq/L (ref 3.5–5.1)
SODIUM: 138 meq/L (ref 135–145)

## 2013-08-22 LAB — CBC WITH DIFFERENTIAL/PLATELET
BASOS ABS: 0.1 10*3/uL (ref 0.0–0.1)
Basophils Relative: 0.7 % (ref 0.0–3.0)
EOS ABS: 0.3 10*3/uL (ref 0.0–0.7)
Eosinophils Relative: 3.3 % (ref 0.0–5.0)
HEMATOCRIT: 44.4 % (ref 39.0–52.0)
Hemoglobin: 15 g/dL (ref 13.0–17.0)
LYMPHS ABS: 2 10*3/uL (ref 0.7–4.0)
LYMPHS PCT: 22.1 % (ref 12.0–46.0)
MCHC: 33.7 g/dL (ref 30.0–36.0)
MCV: 90.7 fl (ref 78.0–100.0)
MONO ABS: 0.4 10*3/uL (ref 0.1–1.0)
Monocytes Relative: 4.1 % (ref 3.0–12.0)
NEUTROS ABS: 6.4 10*3/uL (ref 1.4–7.7)
Neutrophils Relative %: 69.8 % (ref 43.0–77.0)
Platelets: 282 10*3/uL (ref 150.0–400.0)
RBC: 4.9 Mil/uL (ref 4.22–5.81)
RDW: 13.8 % (ref 11.5–14.6)
WBC: 9.2 10*3/uL (ref 4.5–10.5)

## 2013-08-27 ENCOUNTER — Encounter: Payer: Self-pay | Admitting: Internal Medicine

## 2013-08-27 NOTE — Assessment & Plan Note (Signed)
F/u w/GI 

## 2013-08-27 NOTE — Assessment & Plan Note (Signed)
F/u w/Urology 

## 2013-08-28 ENCOUNTER — Other Ambulatory Visit: Payer: Self-pay | Admitting: Internal Medicine

## 2013-09-29 ENCOUNTER — Ambulatory Visit: Payer: Medicare Other | Admitting: Internal Medicine

## 2013-10-13 ENCOUNTER — Encounter: Payer: Self-pay | Admitting: Internal Medicine

## 2013-10-13 ENCOUNTER — Ambulatory Visit (INDEPENDENT_AMBULATORY_CARE_PROVIDER_SITE_OTHER): Payer: Medicare Other | Admitting: Internal Medicine

## 2013-10-13 VITALS — BP 130/78 | HR 76 | Temp 98.6°F | Resp 16 | Wt 223.0 lb

## 2013-10-13 DIAGNOSIS — D489 Neoplasm of uncertain behavior, unspecified: Secondary | ICD-10-CM

## 2013-10-13 DIAGNOSIS — D485 Neoplasm of uncertain behavior of skin: Secondary | ICD-10-CM

## 2013-10-13 NOTE — Progress Notes (Signed)
Pre visit review using our clinic review tool, if applicable. No additional management support is needed unless otherwise documented below in the visit note. 

## 2013-10-13 NOTE — Patient Instructions (Signed)
   Postprocedure instructions :    A Band-Aid should be  changed twice daily. You can take a shower tomorrow.  Keep the wounds clean. You can wash them with liquid soap and water. Pat dry with gauze or a Kleenex tissue. Apply a Band-Aid.   You need to report immediately  if fever, chills or any signs of infection develop.    The biopsy results should be available in 1 -2 weeks.

## 2013-10-13 NOTE — Progress Notes (Signed)
   Procedure Note :     Procedure :  Skin biopsy   Indication:  Changing mole (s ),  Suspicious lesion(s)   Risks including unsuccessful procedure , bleeding, infection, bruising, scar, a need for another complete procedure and others were explained to the patient in detail as well as the benefits. Informed consent was obtained and signed.   The patient was placed in a decubitus position.  Lesion #1 in epigastrium   Measuring 11x9  mm   Skin over lesion #1  was prepped with Betadine and alcohol  and anesthetized with 1 cc of 2% lidocaine and epinephrine, using a 25-gauge 1 inch needle.  Shave biopsy with a sterile Dermablade was carried out in the usual fashion. Hyfrecator was used to destroy the rest of the lesion potentially left behind and for hemostasis. Band-Aid was applied with Mupirocin antibiotic ointment.    Lesion #2 on   breast   measuring 6x4  mm   Skin over lesion #2  was prepped with Betadine and alcohol  and anesthetized with 1/2 cc of 2% lidocaine and epinephrine, using a 25-gauge 1 inch needle.  Shave biopsy with a sterile Dermablade was carried out in the usual fashion. Hyfrecator was used to destroy the rest of the lesion potentially left behind and for hemostasis. Band-Aid was applied with antibiotic ointment.    Tolerated well. Complications none.

## 2013-10-19 ENCOUNTER — Other Ambulatory Visit: Payer: Self-pay | Admitting: Internal Medicine

## 2013-10-19 DIAGNOSIS — C439 Malignant melanoma of skin, unspecified: Secondary | ICD-10-CM

## 2013-10-25 NOTE — Assessment & Plan Note (Signed)
See procedure 

## 2013-11-03 ENCOUNTER — Telehealth: Payer: Self-pay | Admitting: *Deleted

## 2013-11-03 NOTE — Telephone Encounter (Signed)
Message copied by Cresenciano Lick on Fri Nov 03, 2013  5:01 PM ------      Message from: Cassandria Anger      Created: Thu Nov 02, 2013  6:35 PM       Erline Levine, please call pt - did he have skin surgery yet?      Thx            ----- Message -----         From: [facility] in [facility]         Sent: 10/18/2013   3:28 PM           To: Cassandria Anger, MD                   ------

## 2013-11-03 NOTE — Telephone Encounter (Signed)
Great. Thx.

## 2013-11-03 NOTE — Telephone Encounter (Signed)
I spoke to pt- he states he had his surgery. He states the surgeon felt good about the procedure and he felt he got it all.

## 2013-11-29 ENCOUNTER — Encounter: Payer: Self-pay | Admitting: Internal Medicine

## 2014-02-15 ENCOUNTER — Encounter: Payer: Self-pay | Admitting: Internal Medicine

## 2014-02-15 ENCOUNTER — Ambulatory Visit (INDEPENDENT_AMBULATORY_CARE_PROVIDER_SITE_OTHER): Payer: Medicare Other | Admitting: Internal Medicine

## 2014-02-15 VITALS — BP 138/68 | HR 68 | Temp 98.5°F | Resp 16 | Wt 225.0 lb

## 2014-02-15 DIAGNOSIS — R739 Hyperglycemia, unspecified: Secondary | ICD-10-CM

## 2014-02-15 DIAGNOSIS — I10 Essential (primary) hypertension: Secondary | ICD-10-CM | POA: Insufficient documentation

## 2014-02-15 DIAGNOSIS — E785 Hyperlipidemia, unspecified: Secondary | ICD-10-CM

## 2014-02-15 DIAGNOSIS — F3289 Other specified depressive episodes: Secondary | ICD-10-CM

## 2014-02-15 DIAGNOSIS — R7309 Other abnormal glucose: Secondary | ICD-10-CM

## 2014-02-15 DIAGNOSIS — F329 Major depressive disorder, single episode, unspecified: Secondary | ICD-10-CM

## 2014-02-15 DIAGNOSIS — Z23 Encounter for immunization: Secondary | ICD-10-CM

## 2014-02-15 MED ORDER — LOSARTAN POTASSIUM 100 MG PO TABS: 100.0000 mg | ORAL_TABLET | Freq: Every day | ORAL | Status: DC

## 2014-02-15 MED ORDER — ATORVASTATIN CALCIUM 40 MG PO TABS: 40.0000 mg | ORAL_TABLET | Freq: Every day | ORAL | Status: DC

## 2014-02-15 NOTE — Assessment & Plan Note (Signed)
Continue with current prescription therapy as reflected on the Med list.  

## 2014-02-15 NOTE — Progress Notes (Signed)
   Subjective:    HPI   The patient presents for a follow-up of  chronic hypertension, chronic dyslipidemia, hypogonadism controlled with medicines  He saw a urologist for hematuria He saw an Ortho for L hip pain due to LBP - in PT now, better  BP Readings from Last 3 Encounters:  02/15/14 138/68  10/13/13 130/78  08/15/13 142/64   Wt Readings from Last 3 Encounters:  02/15/14 225 lb (102.059 kg)  10/13/13 223 lb (101.152 kg)  08/15/13 223 lb (101.152 kg)        Review of Systems  Constitutional: Negative for appetite change, fatigue and unexpected weight change.  HENT: Negative for nosebleeds and trouble swallowing.   Eyes: Negative for itching and visual disturbance.  Cardiovascular: Negative for palpitations.  Gastrointestinal: Negative for nausea, diarrhea, blood in stool and abdominal distention.  Genitourinary: Negative for frequency and hematuria.  Musculoskeletal: Negative for back pain, gait problem and joint swelling.  Neurological: Negative for dizziness, tremors, speech difficulty and weakness.  Psychiatric/Behavioral: Positive for dysphoric mood. Negative for suicidal ideas, sleep disturbance and agitation. The patient is nervous/anxious.        Objective:   Physical Exam  Constitutional: He is oriented to person, place, and time. He appears well-developed. No distress.  Obese   HENT:  Mouth/Throat: Oropharynx is clear and moist. No oropharyngeal exudate.  Swollen nasal mucosa  Eyes: Conjunctivae are normal. Pupils are equal, round, and reactive to light.  Neck: Normal range of motion. No JVD present. No thyromegaly present.  Cardiovascular: Normal rate, regular rhythm, normal heart sounds and intact distal pulses.  Exam reveals no gallop and no friction rub.   No murmur heard. Pulmonary/Chest: Effort normal and breath sounds normal. No respiratory distress. He has no wheezes. He has no rales. He exhibits no tenderness.  Abdominal: Soft. Bowel sounds  are normal. He exhibits no distension and no mass. There is no tenderness. There is no rebound and no guarding.  Musculoskeletal: Normal range of motion. He exhibits no edema and no tenderness.  Lymphadenopathy:    He has no cervical adenopathy.  Neurological: He is alert and oriented to person, place, and time. He has normal reflexes. No cranial nerve deficit. He exhibits normal muscle tone. Coordination normal.  Skin: Skin is warm and dry. No rash noted.  Psychiatric: He has a normal mood and affect. His behavior is normal. Judgment and thought content normal.  Rectal per Urol 1 mo ago  Lab Results  Component Value Date   WBC 9.2 08/21/2013   HGB 15.0 08/21/2013   HCT 44.4 08/21/2013   PLT 282.0 08/21/2013   GLUCOSE 106* 08/21/2013   CHOL 197 08/21/2013   TRIG 229.0* 08/21/2013   HDL 57.00 08/21/2013   LDLDIRECT 107.8 04/12/2013   LDLCALC 94 08/21/2013   ALT 26 08/21/2013   AST 20 08/21/2013   NA 138 08/21/2013   K 4.5 08/21/2013   CL 103 08/21/2013   CREATININE 0.9 08/21/2013   BUN 11 08/21/2013   CO2 27 08/21/2013   TSH 1.88 08/21/2013   PSA 4.46* 04/12/2013   HGBA1C 5.8 04/09/2011         Assessment & Plan:

## 2014-02-15 NOTE — Progress Notes (Signed)
Pre visit review using our clinic review tool, if applicable. No additional management support is needed unless otherwise documented below in the visit note. 

## 2014-02-15 NOTE — Assessment & Plan Note (Signed)
Chronic and severe Dr Toy Care

## 2014-02-18 ENCOUNTER — Encounter: Payer: Self-pay | Admitting: Internal Medicine

## 2014-02-28 ENCOUNTER — Other Ambulatory Visit: Payer: Self-pay | Admitting: Internal Medicine

## 2014-03-22 ENCOUNTER — Other Ambulatory Visit (INDEPENDENT_AMBULATORY_CARE_PROVIDER_SITE_OTHER): Payer: Medicare Other

## 2014-03-22 DIAGNOSIS — R739 Hyperglycemia, unspecified: Secondary | ICD-10-CM

## 2014-03-22 LAB — HEMOGLOBIN A1C: Hgb A1c MFr Bld: 5.6 % (ref 4.6–6.5)

## 2014-03-22 LAB — BASIC METABOLIC PANEL
BUN: 13 mg/dL (ref 6–23)
CALCIUM: 9 mg/dL (ref 8.4–10.5)
CO2: 27 mEq/L (ref 19–32)
Chloride: 106 mEq/L (ref 96–112)
Creatinine, Ser: 0.9 mg/dL (ref 0.4–1.5)
GFR: 90.34 mL/min (ref >60.00)
GLUCOSE: 97 mg/dL (ref 70–99)
POTASSIUM: 4.3 meq/L (ref 3.5–5.1)
SODIUM: 140 meq/L (ref 135–145)

## 2014-08-13 ENCOUNTER — Ambulatory Visit (INDEPENDENT_AMBULATORY_CARE_PROVIDER_SITE_OTHER): Payer: Medicare Other | Admitting: Ophthalmology

## 2014-08-13 DIAGNOSIS — H33301 Unspecified retinal break, right eye: Secondary | ICD-10-CM | POA: Diagnosis not present

## 2014-08-13 DIAGNOSIS — H43813 Vitreous degeneration, bilateral: Secondary | ICD-10-CM

## 2014-08-13 DIAGNOSIS — H3531 Nonexudative age-related macular degeneration: Secondary | ICD-10-CM

## 2014-08-20 ENCOUNTER — Ambulatory Visit (INDEPENDENT_AMBULATORY_CARE_PROVIDER_SITE_OTHER): Payer: Medicare Other | Admitting: Internal Medicine

## 2014-08-20 ENCOUNTER — Encounter: Payer: Self-pay | Admitting: Internal Medicine

## 2014-08-20 VITALS — BP 120/78 | HR 60 | Wt 223.0 lb

## 2014-08-20 DIAGNOSIS — I10 Essential (primary) hypertension: Secondary | ICD-10-CM | POA: Diagnosis not present

## 2014-08-20 DIAGNOSIS — E291 Testicular hypofunction: Secondary | ICD-10-CM

## 2014-08-20 DIAGNOSIS — K2270 Barrett's esophagus without dysplasia: Secondary | ICD-10-CM | POA: Diagnosis not present

## 2014-08-20 DIAGNOSIS — K2100 Gastro-esophageal reflux disease with esophagitis, without bleeding: Secondary | ICD-10-CM

## 2014-08-20 DIAGNOSIS — F4323 Adjustment disorder with mixed anxiety and depressed mood: Secondary | ICD-10-CM

## 2014-08-20 DIAGNOSIS — Z23 Encounter for immunization: Secondary | ICD-10-CM | POA: Diagnosis not present

## 2014-08-20 MED ORDER — VITAMIN D 1000 UNITS PO TABS: 1000.0000 [IU] | ORAL_TABLET | Freq: Every day | ORAL | Status: AC

## 2014-08-20 NOTE — Assessment & Plan Note (Signed)
Off testosterone x long time 

## 2014-08-20 NOTE — Patient Instructions (Signed)

## 2014-08-20 NOTE — Progress Notes (Signed)
   Subjective:    HPI   The patient presents for a follow-up of  chronic hypertension, chronic dyslipidemia, hypogonadism controlled with medicines  He saw a urologist for hematuria  He saw an Ortho for L hip pain due to LBP - in PT now, better  BP Readings from Last 3 Encounters:  08/20/14 120/78  02/15/14 138/68  10/13/13 130/78   Wt Readings from Last 3 Encounters:  08/20/14 223 lb (101.152 kg)  02/15/14 225 lb (102.059 kg)  10/13/13 223 lb (101.152 kg)        Review of Systems  Constitutional: Negative for appetite change, fatigue and unexpected weight change.  HENT: Negative for nosebleeds and trouble swallowing.   Eyes: Negative for itching and visual disturbance.  Cardiovascular: Negative for palpitations.  Gastrointestinal: Negative for nausea, diarrhea, blood in stool and abdominal distention.  Genitourinary: Negative for frequency and hematuria.  Musculoskeletal: Negative for back pain, joint swelling and gait problem.  Neurological: Negative for dizziness, tremors, speech difficulty and weakness.  Psychiatric/Behavioral: Positive for dysphoric mood. Negative for suicidal ideas, sleep disturbance and agitation. The patient is nervous/anxious.        Objective:   Physical Exam  Constitutional: He is oriented to person, place, and time. He appears well-developed. No distress.  Obese   HENT:  Mouth/Throat: Oropharynx is clear and moist. No oropharyngeal exudate.  Swollen nasal mucosa  Eyes: Conjunctivae are normal. Pupils are equal, round, and reactive to light.  Neck: Normal range of motion. No JVD present. No thyromegaly present.  Cardiovascular: Normal rate, regular rhythm, normal heart sounds and intact distal pulses.  Exam reveals no gallop and no friction rub.   No murmur heard. Pulmonary/Chest: Effort normal and breath sounds normal. No respiratory distress. He has no wheezes. He has no rales. He exhibits no tenderness.  Abdominal: Soft. Bowel sounds  are normal. He exhibits no distension and no mass. There is no tenderness. There is no rebound and no guarding.  Musculoskeletal: Normal range of motion. He exhibits no edema or tenderness.  Lymphadenopathy:    He has no cervical adenopathy.  Neurological: He is alert and oriented to person, place, and time. He has normal reflexes. No cranial nerve deficit. He exhibits normal muscle tone. Coordination normal.  Skin: Skin is warm and dry. No rash noted.  Psychiatric: He has a normal mood and affect. His behavior is normal. Judgment and thought content normal.  Rectal per Urol   Lab Results  Component Value Date   WBC 9.2 08/21/2013   HGB 15.0 08/21/2013   HCT 44.4 08/21/2013   PLT 282.0 08/21/2013   GLUCOSE 97 03/22/2014   CHOL 197 08/21/2013   TRIG 229.0* 08/21/2013   HDL 57.00 08/21/2013   LDLDIRECT 107.8 04/12/2013   LDLCALC 94 08/21/2013   ALT 26 08/21/2013   AST 20 08/21/2013   NA 140 03/22/2014   K 4.3 03/22/2014   CL 106 03/22/2014   CREATININE 0.9 03/22/2014   BUN 13 03/22/2014   CO2 27 03/22/2014   TSH 1.88 08/21/2013   PSA 4.46* 04/12/2013   HGBA1C 5.6 03/22/2014         Assessment & Plan:

## 2014-08-20 NOTE — Addendum Note (Signed)
Addended by: Cresenciano Lick on: 08/20/2014 04:42 PM   Modules accepted: Orders

## 2014-08-20 NOTE — Assessment & Plan Note (Signed)
On Omeprazole 

## 2014-08-20 NOTE — Assessment & Plan Note (Signed)
Chronic On Lexapro and Abilify Dr [facility]

## 2014-08-20 NOTE — Progress Notes (Signed)
Pre visit review using our clinic review tool, if applicable. No additional management support is needed unless otherwise documented below in the visit note. 

## 2014-08-20 NOTE — Assessment & Plan Note (Signed)
On Losartan 

## 2014-08-21 ENCOUNTER — Telehealth: Payer: Self-pay | Admitting: Internal Medicine

## 2014-08-21 NOTE — Telephone Encounter (Signed)
emmi emailed °

## 2014-09-25 ENCOUNTER — Telehealth: Payer: Self-pay

## 2014-09-25 NOTE — Telephone Encounter (Signed)
Call to the patient to introduce AWV; Schedule busy for now, but scheduled for August 17/ wed at 9:30am

## 2015-01-09 ENCOUNTER — Ambulatory Visit (INDEPENDENT_AMBULATORY_CARE_PROVIDER_SITE_OTHER): Payer: Medicare Other

## 2015-01-09 VITALS — BP 130/80 | Ht 73.0 in | Wt 224.2 lb

## 2015-01-09 DIAGNOSIS — Z0000 Encounter for general adult medical examination without abnormal findings: Secondary | ICD-10-CM | POA: Diagnosis not present

## 2015-01-09 NOTE — Patient Instructions (Addendum)
Oscar [name] , Thank you for taking time to come for your Medicare Wellness Visit. I appreciate your ongoing commitment to your health goals. Please review the following plan we discussed and let me know if I can assist you in the future.   1 will bring a copy of HCPOA  At Sept visit  2. Hearing check at some point 3. Will fup on shingles with insurance 4. Flu shot when available  Will discuss episodes of diarrhea with Dr. Alain Marion   These are the goals we discussed: Goals    . to remain healthy     [facility]; continue to engage in social activities; Take care of health ongoing       This is a list of the screening recommended for you and due dates:  Health Maintenance  Topic Date Due  . Shingles Vaccine  09/10/2001  . Flu Shot  12/24/2014  . Colon Cancer Screening  04/22/2020  . Tetanus Vaccine  08/19/2024  . Pneumonia vaccines  Completed     Health Maintenance A healthy lifestyle and preventative care can promote health and wellness.  Maintain regular health, dental, and eye exams.  Eat a healthy diet. Foods like vegetables, fruits, whole grains, low-fat dairy products, and lean protein foods contain the nutrients you need and are low in calories. Decrease your intake of foods high in solid fats, added sugars, and salt. Get information about a proper diet from your health care provider, if necessary.  Regular physical exercise is one of the most important things you can do for your health. Most adults should get at least 150 minutes of moderate-intensity exercise (any activity that increases your heart rate and causes you to sweat) each week. In addition, most adults need muscle-strengthening exercises on 2 or more days a week.   Maintain a healthy weight. The body mass index (BMI) is a screening tool to identify possible weight problems. It provides an estimate of body fat based on height and weight. Your health care provider can find your BMI and can help you  achieve or maintain a healthy weight. For males 20 years and older:  A BMI below 18.5 is considered underweight.  A BMI of 18.5 to 24.9 is normal.  A BMI of 25 to 29.9 is considered overweight.  A BMI of 30 and above is considered obese.  Maintain normal blood lipids and cholesterol by exercising and minimizing your intake of saturated fat. Eat a balanced diet with plenty of fruits and vegetables. Blood tests for lipids and cholesterol should begin at age 20 and be repeated every 5 years. If your lipid or cholesterol levels are high, you are over age 8, or you are at high risk for heart disease, you may need your cholesterol levels checked more frequently.Ongoing high lipid and cholesterol levels should be treated with medicines if diet and exercise are not working.  If you smoke, find out from your health care provider how to quit. If you do not use tobacco, do not start.  Lung cancer screening is recommended for adults aged 74-80 years who are at high risk for developing lung cancer because of a history of smoking. A yearly low-dose CT scan of the lungs is recommended for people who have at least a 30-pack-year history of smoking and are current smokers or have quit within the past 15 years. A pack year of smoking is smoking an average of 1 pack of cigarettes a day for 1 year (for example,  a 30-pack-year history of smoking could mean smoking 1 pack a day for 30 years or 2 packs a day for 15 years). Yearly screening should continue until the smoker has stopped smoking for at least 15 years. Yearly screening should be stopped for people who develop a health problem that would prevent them from having lung cancer treatment.  If you choose to drink alcohol, do not have more than 2 drinks per day. One drink is considered to be 12 oz (360 mL) of beer, 5 oz (150 mL) of wine, or 1.5 oz (45 mL) of liquor.  Avoid the use of street drugs. Do not share needles with anyone. Ask for help if you need support  or instructions about stopping the use of drugs.  High blood pressure causes heart disease and increases the risk of stroke. Blood pressure should be checked at least every 1-2 years. Ongoing high blood pressure should be treated with medicines if weight loss and exercise are not effective.  If you are 30-87 years old, ask your health care provider if you should take aspirin to prevent heart disease.  Diabetes screening involves taking a blood sample to check your fasting blood sugar level. This should be done once every 3 years after age 14 if you are at a normal weight and without risk factors for diabetes. Testing should be considered at a younger age or be carried out more frequently if you are overweight and have at least 1 risk factor for diabetes.  Colorectal cancer can be detected and often prevented. Most routine colorectal cancer screening begins at the age of 66 and continues through age 19. However, your health care provider may recommend screening at an earlier age if you have risk factors for colon cancer. On a yearly basis, your health care provider may provide home test kits to check for hidden blood in the stool. A small camera at the end of a tube may be used to directly examine the colon (sigmoidoscopy or colonoscopy) to detect the earliest forms of colorectal cancer. Talk to your health care provider about this at age 51 when routine screening begins. A direct exam of the colon should be repeated every 5-10 years through age 11, unless early forms of precancerous polyps or small growths are found.  People who are at an increased risk for hepatitis B should be screened for this virus. You are considered at high risk for hepatitis B if:  You were born in a country where hepatitis B occurs often. Talk with your health care provider about which countries are considered high risk.  Your parents were born in a high-risk country and you have not received a shot to protect against hepatitis  B (hepatitis B vaccine).  You have HIV or AIDS.  You use needles to inject street drugs.  You live with, or have sex with, someone who has hepatitis B.  You are a man who has sex with other men (MSM).  You get hemodialysis treatment.  You take certain medicines for conditions like cancer, organ transplantation, and autoimmune conditions.  Hepatitis C blood testing is recommended for all people born from [birth year] through 1964 and any individual with known risk factors for hepatitis C.  Healthy men should no longer receive prostate-specific antigen (PSA) blood tests as part of routine cancer screening. Talk to your health care provider about prostate cancer screening.  Testicular cancer screening is not recommended for adolescents or adult males who have no symptoms. Screening includes self-exam, a health  care provider exam, and other screening tests. Consult with your health care provider about any symptoms you have or any concerns you have about testicular cancer.  Practice safe sex. Use condoms and avoid high-risk sexual practices to reduce the spread of sexually transmitted infections (STIs).  You should be screened for STIs, including gonorrhea and chlamydia if:  You are sexually active and are younger than 24 years.  You are older than 24 years, and your health care provider tells you that you are at risk for this type of infection.  Your sexual activity has changed since you were last screened, and you are at an increased risk for chlamydia or gonorrhea. Ask your health care provider if you are at risk.  If you are at risk of being infected with HIV, it is recommended that you take a prescription medicine daily to prevent HIV infection. This is called pre-exposure prophylaxis (PrEP). You are considered at risk if:  You are a man who has sex with other men (MSM).  You are a heterosexual man who is sexually active with multiple partners.  You take drugs by injection.  You are  sexually active with a partner who has HIV.  Talk with your health care provider about whether you are at high risk of being infected with HIV. If you choose to begin PrEP, you should first be tested for HIV. You should then be tested every 3 months for as long as you are taking PrEP.  Use sunscreen. Apply sunscreen liberally and repeatedly throughout the day. You should seek shade when your shadow is shorter than you. Protect yourself by wearing long sleeves, pants, a wide-brimmed hat, and sunglasses year round whenever you are outdoors.  Tell your health care provider of new moles or changes in moles, especially if there is a change in shape or color. Also, tell your health care provider if a mole is larger than the size of a pencil eraser.  A one-time screening for abdominal aortic aneurysm (AAA) and surgical repair of large AAAs by ultrasound is recommended for men aged 74-75 years who are current or former smokers.  Stay current with your vaccines (immunizations). Document Released: 11/07/2007 Document Revised: 05/16/2013 Document Reviewed: 10/06/2010 [facility] Patient Information 2015 [place], [place]. This information is not intended to replace advice given to you by your health care provider. Make sure you discuss any questions you have with your health care provider.

## 2015-01-09 NOTE — Progress Notes (Addendum)
Subjective:   Oscar [name] is a 74 y.o. male who presents for an Initial Medicare Annual Wellness Visit.  Review of Systems   HRA assessment completed during visit; Oscar [name] Patient is here for Annual Wellness Assessment The Patient was informed that this wellness visit is to identify risk and educate on how to reduce risk for increase disease through lifestyle changes.  The patient verbalized understanding that any voiced medical issues still need to be directed to their physician.    Did state that diarrhea seem to be more prevalent but has not interrupted his lifestyle Also may discuss repeat endo as he was concerned about ? Cancer;  Will discuss with Dr. Alain Marion with visit in Sept.  Overall; States health is very good; feels all of his chronic conditions are under control  Problem list reviewed and are being managed medically; Will educate for lifestyle changes as appropriate Due to family hx of MI; reviewed weight and metabolic syndrome;  HTN; dyslipidemia; Lipids / trig 229; HDL 57/ ratio 3; A1c Oct 2015 5.6 /  The patient is more concerned about family hx of cancer; States may have been lifestyle issues with his father; Is up to date on appropriate screens and did discuss sun exposure, which for now, stops him from playing golf. Discussed playing spring and fall during early am or late afternoon and evening. Wear sunscreen; discussed UV protective clothing when out; Has a hat with neck coverage;  Psychosocial and family hx: Depression under control and presenting no issues at present Hx of tobacco use/ 30 pack hx but stopped 104; 74 yo Family Hx: mother had colon cancer; Father had MI Mother died of cancer; grand-mother died of cancer and 6 of their brothers died of cancer; The patient has one brother; 8 years younger;  Precancerous condition in esophagus; 2 melanomas removed;  Currently lives with 73 yo grand-dtr they raised as wife has expired. Retired 8 years ago and does  enjoy his retirement  Diet; small glass of OJ for breakfast; cantaloupe; iced coffee; croissant Lunch sandwich; Does  eat out to much because he does not like to cook. Eats salad, vegetable;  Restaurants; Pastabilities; [place] cuisine   Exercise: Was very active during the day; did run but now he cannot due to knees; [facility] walking at the [facility]; 3 x a week; 2 miles each visit Cannot increase at this point mainly due to time  Volunteers at the soup kitchen;   Goals: lives day to day;  Stay healthy / Feels his weight is good for now;    Safety: live in 2 level home; exist on the first level;  community; firearm safety; smoke alarms;   Screenings overdue Shingles; Educated to check with insurance regarding coverage of Shingles vaccination on Part D or Part B and may have lower co-pay if provided on the Part D side Given information on vaccine and contraindication.   ________________________ Ophthalmology exam; have them checked every year; cataracts removed;  Descent vision; inc'ing using reading glasses; [facility]; neg glaucoma;  Hx; Tear in retina in retina was repaired Immunizations; Prevnar and PCV 23 completed Tetanus March 2026 Colonoscopy; 03/2020/ last done 2011/ more diarrhea noted and will discuss with [facility] Endoscopic 2014; repeat in 3 years;  EKG 08/15/2013 Hearing: 2000 hz both ears; hearing screen in the future; Dental: Regular dental work  Gave information on safety to take home;   Current Care Team reviewed and updated Gastro Dr. Fuller Plan  Pulmonary; Dr. Melvyn Novas Dr. Arthur Holms; [place]  [facility]; problems with right shoulder; brought on fishing Dr. Toy Care; Depression Dr. Junious Silk;    Cardiac Risk Factors include: advanced age (>65men, >52 women);dyslipidemia;hypertension    Objective:    Today's Vitals   01/09/15 0937  BP: 130/80  Height: 6\' 1"  (1.854 m)  Weight: 224 lb 4 oz (101.719 kg)    Current Medications (verified) Outpatient Encounter  Prescriptions as of 01/09/2015  Medication Sig  . ARIPiprazole (ABILIFY) 10 MG tablet Take 10 mg by mouth daily.    Marland Kitchen aspirin 81 MG EC tablet Take 325 mg by mouth daily.   Marland Kitchen atorvastatin (LIPITOR) 40 MG tablet Take 1 tablet (40 mg total) by mouth daily.  . cholecalciferol (VITAMIN D) 1000 UNITS tablet Take 1 tablet (1,000 Units total) by mouth daily.  Marland Kitchen desvenlafaxine (PRISTIQ) 50 MG 24 hr tablet Take 50 mg by mouth daily.  Marland Kitchen losartan (COZAAR) 100 MG tablet Take 1 tablet (100 mg total) by mouth daily.  Marland Kitchen omeprazole (PRILOSEC) 20 MG capsule Take 40 mg by mouth daily.    . sertraline (ZOLOFT) 100 MG tablet Take 100 mg by mouth daily.    No facility-administered encounter medications on file as of 01/09/2015.    Allergies (verified) Celecoxib; Penicillins; and Rofecoxib   History: Past Medical History  Diagnosis Date  . Hypogonadism male   . Anxiety state, unspecified   . Rosacea   . Allergic rhinitis, cause unspecified   . Barrett esophagus   . GERD (gastroesophageal reflux disease)   . Adenomatous colon polyp   . Other and unspecified hyperlipidemia   . Depressive disorder, not elsewhere classified   . UTI (lower urinary tract infection) 2009  . BPH (benign prostatic hypertrophy)     BX 2007  . Microhematuria    Past Surgical History  Procedure Laterality Date  . Prostate biopsy  2011    Dr Winifred Olive   . Cataract extraction  05/10/2012    left   Family History  Problem Relation Age of Onset  . Coronary artery disease Other   . Hypertension Mother   . Cancer Mother     colon ca  . Colon cancer Mother 66  . Heart disease Father     MI  . Stomach cancer Neg Hx    Social History   Occupational History  . [employer] - Retired 2010/june    Social History Main Topics  . Smoking status: Former Smoker -- 1.50 packs/day for 20 years    Types: Cigarettes    Quit date: 05/25/1978  . Smokeless tobacco: Never Used  . Alcohol Use: 4.2 oz/week    7 Standard drinks or equivalent  per week     Comment: 1 drink per day  . Drug Use: No  . Sexual Activity: Yes   Tobacco Counseling Counseling given: Not Answered   Activities of Daily Living In your present state of health, do you have any difficulty performing the following activities: 01/09/2015  Hearing? Y  Vision? N  Difficulty concentrating or making decisions? N  Walking or climbing stairs? N  Dressing or bathing? N  Doing errands, shopping? N  Preparing Food and eating ? N  Using the Toilet? N  In the past six months, have you accidently leaked urine? N  Do you have problems with loss of bowel control? N  Managing your Medications? N  Managing your Finances? N  Housekeeping or managing your Housekeeping? N    Immunizations and Health Maintenance Immunization History  Administered Date(s) Administered  . Influenza  Split 04/09/2011, 03/01/2012  . Influenza Whole 03/19/2009  . Influenza,inj,Quad PF,36+ Mos 03/16/2013, 02/15/2014  . Pneumococcal Conjugate-13 08/15/2013  . Pneumococcal Polysaccharide-23 07/22/2009  . Td 08/20/2014   Health Maintenance Due  Topic Date Due  . ZOSTAVAX  09/10/2001  . INFLUENZA VACCINE  12/24/2014    Patient Care Team: Cassandria Anger, MD as PCP - General Chucky May, MD (Psychiatry) Festus Aloe, MD as Attending Physician (Urology) Tanda Rockers, MD as Attending Physician (Pulmonary Disease) Ladene Artist, MD as Attending Physician (Gastroenterology)  Indicate any recent Medical Services you may have received from other than [facility] providers in the past year (date may be approximate).    Assessment:   This is a routine wellness examination for Oscar [name].    The goal of the wellness visit is to assist the patient how to close the gaps in care and create a preventative care plan for the patient.  Personalized Education was given regarding: Sunscreens; CVD risk secondary to HTN and lipids; reviewed weight; but patient feels healthy at present;  Hx smoking  but quit 35+ years ago. ETOH 2 glasses per day; Taking meds without issues; no barriers identified in getting medications. Labs were and fup visit noted with MD if labs are due to be re-drawn.  No Risk for hepatitis or high risk social behavior identified; no illicit drug use; given written information on hepatitis  Educated on shingles and follow up with insurance company for co-pays or charges applied to Part D benefit. Educated on Vaccines;  Safety issues reviewed; He built home and lives on 1st floor. Built home with accessibility in mind and plans to age in place.  Cognition assessed by AD8; Score 0  Depression screen negative; Stable with ongoing treatment  Functional status reviewed; no losses in function x 1 year; Does see Dr. Junious Silk but for other issues;    End of life planning was discussed; HCPOA has been completed and will bring in HCPOA to his apt with Dr. Alain Marion in September.    Hearing/Vision screen  Hearing Screening   125Hz  250Hz  500Hz  1000Hz  2000Hz  4000Hz  8000Hz   Right ear:     100    Left ear:     100      Dietary issues and exercise activities discussed: Current Exercise Habits:: Home exercise routine (not sedentary; very active; played gold until melonama; ), Time (Minutes): 45, Frequency (Times/Week): 3, Weekly Exercise (Minutes/Week): 135, Intensity: Moderate  Goals    . to remain healthy     [facility]; continue to engage in social activities; Take care of health ongoing      Depression Screen PHQ 2/9 Scores 01/09/2015 08/20/2014  PHQ - 2 Score 0 0    Fall Risk Fall Risk  01/09/2015 08/20/2014  Falls in the past year? Yes No  Number falls in past yr: 1 -  Injury with Fall? No -    Cognitive Function: No flowsheet data found.  Screening Tests Health Maintenance  Topic Date Due  . ZOSTAVAX  09/10/2001  . INFLUENZA VACCINE  12/24/2014  . COLONOSCOPY  04/22/2020  . TETANUS/TDAP  08/19/2024  . PNA vac Low Risk Adult  Completed         Plan:   1 will bring a copy of HCPOA  At Sept visit  2. Hearing check at some point 3. Will fup on shingles with insurance 4. Flu shot when available  Will discuss episodes of diarrhea with Dr. Alain Marion May want repeat endoscopy  to discuss with Dr. Alain Marion  During the course of the visit Adonai was educated and counseled about the following appropriate screening and preventive services:   Vaccines to include Pneumoccal, Influenza, Hepatitis B, Td, Zostavax, Electrocardiogram/ 07/2013  Colorectal cancer screening/ due in 03/2020 or sooner if [place]  Cardiovascular disease screening/ no issues presented at present  Diabetes screening/ completed and A1c 5.6  Glaucoma screening/ negative; Goes to [facility]  Nutrition counseling/ no issues with weight or diet at present; no goal to lose weight noted today;  Prostate cancer screening deferred to PCP  Smoking cessation counseling/ former smoker; quite many years ago  Patient Instructions (the written plan) were given to the patient.   Wynetta Fines, RN   01/09/2015     Medical screening examination/treatment/procedure(s) were performed by non-physician practitioner and as supervising physician I was immediately available for consultation/collaboration. I agree with above. Walker Kehr, MD

## 2015-02-15 ENCOUNTER — Other Ambulatory Visit (INDEPENDENT_AMBULATORY_CARE_PROVIDER_SITE_OTHER): Payer: Medicare Other

## 2015-02-15 ENCOUNTER — Other Ambulatory Visit: Payer: Self-pay | Admitting: *Deleted

## 2015-02-15 DIAGNOSIS — Z0000 Encounter for general adult medical examination without abnormal findings: Secondary | ICD-10-CM

## 2015-02-15 DIAGNOSIS — E785 Hyperlipidemia, unspecified: Secondary | ICD-10-CM

## 2015-02-15 DIAGNOSIS — I10 Essential (primary) hypertension: Secondary | ICD-10-CM

## 2015-02-15 LAB — URINALYSIS, ROUTINE W REFLEX MICROSCOPIC
BILIRUBIN URINE: NEGATIVE
HGB URINE DIPSTICK: NEGATIVE
Ketones, ur: NEGATIVE
Leukocytes, UA: NEGATIVE
Nitrite: NEGATIVE
Specific Gravity, Urine: 1.015 (ref 1.000–1.030)
TOTAL PROTEIN, URINE-UPE24: NEGATIVE
URINE GLUCOSE: NEGATIVE
UROBILINOGEN UA: 0.2 (ref 0.0–1.0)
pH: 6.5 (ref 5.0–8.0)

## 2015-02-15 LAB — LIPID PANEL
Cholesterol: 177 mg/dL (ref 0–200)
HDL: 59.9 mg/dL (ref >39.00)
LDL Cholesterol: 84 mg/dL (ref 0–99)
NonHDL: 117.29
Total CHOL/HDL Ratio: 3
Triglycerides: 168 mg/dL — ABNORMAL HIGH (ref 0.0–149.0)
VLDL: 33.6 mg/dL (ref 0.0–40.0)

## 2015-02-15 LAB — BASIC METABOLIC PANEL
BUN: 13 mg/dL (ref 6–23)
CO2: 24 mEq/L (ref 19–32)
Calcium: 9.2 mg/dL (ref 8.4–10.5)
Chloride: 106 mEq/L (ref 96–112)
Creatinine, Ser: 0.82 mg/dL (ref 0.40–1.50)
GFR: 97.77 mL/min (ref >60.00)
Glucose, Bld: 95 mg/dL (ref 70–99)
Potassium: 4.1 mEq/L (ref 3.5–5.1)
Sodium: 142 mEq/L (ref 135–145)

## 2015-02-15 LAB — CBC WITH DIFFERENTIAL/PLATELET
Basophils Absolute: 0 10*3/uL (ref 0.0–0.1)
Basophils Relative: 0.2 % (ref 0.0–3.0)
EOS PCT: 2.5 % (ref 0.0–5.0)
Eosinophils Absolute: 0.3 10*3/uL (ref 0.0–0.7)
HCT: 42.7 % (ref 39.0–52.0)
Hemoglobin: 14.3 g/dL (ref 13.0–17.0)
LYMPHS ABS: 2.1 10*3/uL (ref 0.7–4.0)
Lymphocytes Relative: 20.3 % (ref 12.0–46.0)
MCHC: 33.6 g/dL (ref 30.0–36.0)
MCV: 89.3 fl (ref 78.0–100.0)
MONOS PCT: 5.5 % (ref 3.0–12.0)
Monocytes Absolute: 0.6 10*3/uL (ref 0.1–1.0)
NEUTROS ABS: 7.3 10*3/uL (ref 1.4–7.7)
NEUTROS PCT: 71.5 % (ref 43.0–77.0)
PLATELETS: 259 10*3/uL (ref 150.0–400.0)
RBC: 4.78 Mil/uL (ref 4.22–5.81)
RDW: 13.9 % (ref 11.5–15.5)
WBC: 10.2 10*3/uL (ref 4.0–10.5)

## 2015-02-15 LAB — HEPATIC FUNCTION PANEL
ALK PHOS: 82 U/L (ref 39–117)
ALT: 23 U/L (ref 0–53)
AST: 18 U/L (ref 0–37)
Albumin: 4.1 g/dL (ref 3.5–5.2)
BILIRUBIN DIRECT: 0.1 mg/dL (ref 0.0–0.3)
TOTAL PROTEIN: 6.7 g/dL (ref 6.0–8.3)
Total Bilirubin: 0.6 mg/dL (ref 0.2–1.2)

## 2015-02-15 LAB — TSH: TSH: 1.41 u[IU]/mL (ref 0.35–4.50)

## 2015-02-15 LAB — PSA: PSA: 2.6 ng/mL (ref 0.10–4.00)

## 2015-02-20 ENCOUNTER — Encounter: Payer: Self-pay | Admitting: Internal Medicine

## 2015-02-20 ENCOUNTER — Ambulatory Visit: Payer: Medicare Other | Admitting: Internal Medicine

## 2015-02-20 ENCOUNTER — Ambulatory Visit (INDEPENDENT_AMBULATORY_CARE_PROVIDER_SITE_OTHER): Payer: Medicare Other | Admitting: Internal Medicine

## 2015-02-20 VITALS — BP 140/80 | HR 59 | Ht 73.0 in | Wt 229.0 lb

## 2015-02-20 DIAGNOSIS — R635 Abnormal weight gain: Secondary | ICD-10-CM | POA: Diagnosis not present

## 2015-02-20 DIAGNOSIS — F4323 Adjustment disorder with mixed anxiety and depressed mood: Secondary | ICD-10-CM

## 2015-02-20 DIAGNOSIS — Z0000 Encounter for general adult medical examination without abnormal findings: Secondary | ICD-10-CM | POA: Diagnosis not present

## 2015-02-20 DIAGNOSIS — Z23 Encounter for immunization: Secondary | ICD-10-CM | POA: Diagnosis not present

## 2015-02-20 DIAGNOSIS — M5440 Lumbago with sciatica, unspecified side: Secondary | ICD-10-CM

## 2015-02-20 DIAGNOSIS — K2270 Barrett's esophagus without dysplasia: Secondary | ICD-10-CM

## 2015-02-20 NOTE — Assessment & Plan Note (Signed)
On Pristique, Zoloft, Abilify Dr Toy Care

## 2015-02-20 NOTE — Assessment & Plan Note (Signed)
Chronic  On Omeprazole

## 2015-02-20 NOTE — Assessment & Plan Note (Signed)
Discussed options: Oscar [name]

## 2015-02-20 NOTE — Assessment & Plan Note (Signed)

## 2015-02-20 NOTE — Patient Instructions (Signed)
[name]  [place], [place] for weight loss  Weight watchers diet      Preventive Care for Adults A healthy lifestyle and preventive care can promote health and wellness. Preventive health guidelines for men include the following key practices:  A routine yearly physical is a good way to check with your health care provider about your health and preventative screening. It is a chance to share any concerns and updates on your health and to receive a thorough exam.  Visit your dentist for a routine exam and preventative care every 6 months. Brush your teeth twice a day and floss once a day. Good oral hygiene prevents tooth decay and gum disease.  The frequency of eye exams is based on your age, health, family medical history, use of contact lenses, and other factors. Follow your health care provider's recommendations for frequency of eye exams.  Eat a healthy diet. Foods such as vegetables, fruits, whole grains, low-fat dairy products, and lean protein foods contain the nutrients you need without too many calories. Decrease your intake of foods high in solid fats, added sugars, and salt. Eat the right amount of calories for you.Get information about a proper diet from your health care provider, if necessary.  Regular physical exercise is one of the most important things you can do for your health. Most adults should get at least 150 minutes of moderate-intensity exercise (any activity that increases your heart rate and causes you to sweat) each week. In addition, most adults need muscle-strengthening exercises on 2 or more days a week.  Maintain a healthy weight. The body mass index (BMI) is a screening tool to identify possible weight problems. It provides an estimate of body fat based on height and weight. Your health care provider can find your BMI and can help you achieve or maintain a healthy weight.For adults 20 years and older:  A BMI below 18.5 is considered underweight.  A BMI  of 18.5 to 24.9 is normal.  A BMI of 25 to 29.9 is considered overweight.  A BMI of 30 and above is considered obese.  Maintain normal blood lipids and cholesterol levels by exercising and minimizing your intake of saturated fat. Eat a balanced diet with plenty of fruit and vegetables. Blood tests for lipids and cholesterol should begin at age 34 and be repeated every 5 years. If your lipid or cholesterol levels are high, you are over 50, or you are at high risk for heart disease, you may need your cholesterol levels checked more frequently.Ongoing high lipid and cholesterol levels should be treated with medicines if diet and exercise are not working.  If you smoke, find out from your health care provider how to quit. If you do not use tobacco, do not start.  Lung cancer screening is recommended for adults aged 83-80 years who are at high risk for developing lung cancer because of a history of smoking. A yearly low-dose CT scan of the lungs is recommended for people who have at least a 30-pack-year history of smoking and are a current smoker or have quit within the past 15 years. A pack year of smoking is smoking an average of 1 pack of cigarettes a day for 1 year (for example: 1 pack a day for 30 years or 2 packs a day for 15 years). Yearly screening should continue until the smoker has stopped smoking for at least 15 years. Yearly screening should be stopped for people who develop a health problem that would prevent them  from having lung cancer treatment.  If you choose to drink alcohol, do not have more than 2 drinks per day. One drink is considered to be 12 ounces (355 mL) of beer, 5 ounces (148 mL) of wine, or 1.5 ounces (44 mL) of liquor.  Avoid use of street drugs. Do not share needles with anyone. Ask for help if you need support or instructions about stopping the use of drugs.  High blood pressure causes heart disease and increases the risk of stroke. Your blood pressure should be checked  at least every 1-2 years. Ongoing high blood pressure should be treated with medicines, if weight loss and exercise are not effective.  If you are 72-62 years old, ask your health care provider if you should take aspirin to prevent heart disease.  Diabetes screening involves taking a blood sample to check your fasting blood sugar level. This should be done once every 3 years, after age 62, if you are within normal weight and without risk factors for diabetes. Testing should be considered at a younger age or be carried out more frequently if you are overweight and have at least 1 risk factor for diabetes.  Colorectal cancer can be detected and often prevented. Most routine colorectal cancer screening begins at the age of 83 and continues through age 28. However, your health care provider may recommend screening at an earlier age if you have risk factors for colon cancer. On a yearly basis, your health care provider may provide home test kits to check for hidden blood in the stool. Use of a small camera at the end of a tube to directly examine the colon (sigmoidoscopy or colonoscopy) can detect the earliest forms of colorectal cancer. Talk to your health care provider about this at age 77, when routine screening begins. Direct exam of the colon should be repeated every 5-10 years through age 41, unless early forms of precancerous polyps or small growths are found.  People who are at an increased risk for hepatitis B should be screened for this virus. You are considered at high risk for hepatitis B if:  You were born in a country where hepatitis B occurs often. Talk with your health care provider about which countries are considered high risk.  Your parents were born in a high-risk country and you have not received a shot to protect against hepatitis B (hepatitis B vaccine).  You have HIV or AIDS.  You use needles to inject street drugs.  You live with, or have sex with, someone who has hepatitis  B.  You are a man who has sex with other men (MSM).  You get hemodialysis treatment.  You take certain medicines for conditions such as cancer, organ transplantation, and autoimmune conditions.  Hepatitis C blood testing is recommended for all people born from [birth year] through 1965 and any individual with known risks for hepatitis C.  Practice safe sex. Use condoms and avoid high-risk sexual practices to reduce the spread of sexually transmitted infections (STIs). STIs include gonorrhea, chlamydia, syphilis, trichomonas, herpes, HPV, and human immunodeficiency virus (HIV). Herpes, HIV, and HPV are viral illnesses that have no cure. They can result in disability, cancer, and death.  If you are at risk of being infected with HIV, it is recommended that you take a prescription medicine daily to prevent HIV infection. This is called preexposure prophylaxis (PrEP). You are considered at risk if:  You are a man who has sex with other men (MSM) and have other risk factors.  You are a heterosexual man, are sexually active, and are at increased risk for HIV infection.  You take drugs by injection.  You are sexually active with a partner who has HIV.  Talk with your health care provider about whether you are at high risk of being infected with HIV. If you choose to begin PrEP, you should first be tested for HIV. You should then be tested every 3 months for as long as you are taking PrEP.  A one-time screening for abdominal aortic aneurysm (AAA) and surgical repair of large AAAs by ultrasound are recommended for men ages 70 to 37 years who are current or former smokers.  Healthy men should no longer receive prostate-specific antigen (PSA) blood tests as part of routine cancer screening. Talk with your health care provider about prostate cancer screening.  Testicular cancer screening is not recommended for adult males who have no symptoms. Screening includes self-exam, a health care provider exam, and  other screening tests. Consult with your health care provider about any symptoms you have or any concerns you have about testicular cancer.  Use sunscreen. Apply sunscreen liberally and repeatedly throughout the day. You should seek shade when your shadow is shorter than you. Protect yourself by wearing long sleeves, pants, a wide-brimmed hat, and sunglasses year round, whenever you are outdoors.  Once a month, do a whole-body skin exam, using a mirror to look at the skin on your back. Tell your health care provider about new moles, moles that have irregular borders, moles that are larger than a pencil eraser, or moles that have changed in shape or color.  Stay current with required vaccines (immunizations).  Influenza vaccine. All adults should be immunized every year.  Tetanus, diphtheria, and acellular pertussis (Td, Tdap) vaccine. An adult who has not previously received Tdap or who does not know his vaccine status should receive 1 dose of Tdap. This initial dose should be followed by tetanus and diphtheria toxoids (Td) booster doses every 10 years. Adults with an unknown or incomplete history of completing a 3-dose immunization series with Td-containing vaccines should begin or complete a primary immunization series including a Tdap dose. Adults should receive a Td booster every 10 years.  Varicella vaccine. An adult without evidence of immunity to varicella should receive 2 doses or a second dose if he has previously received 1 dose.  Human papillomavirus (HPV) vaccine. Males aged 1-21 years who have not received the vaccine previously should receive the 3-dose series. Males aged 22-26 years may be immunized. Immunization is recommended through the age of 32 years for any male who has sex with males and did not get any or all doses earlier. Immunization is recommended for any person with an immunocompromised condition through the age of 58 years if he did not get any or all doses earlier. During  the 3-dose series, the second dose should be obtained 4-8 weeks after the first dose. The third dose should be obtained 24 weeks after the first dose and 16 weeks after the second dose.  Zoster vaccine. One dose is recommended for adults aged 82 years or older unless certain conditions are present.  Measles, mumps, and rubella (MMR) vaccine. Adults born before 30 generally are considered immune to measles and mumps. Adults born in [birth year] or later should have 1 or more doses of MMR vaccine unless there is a contraindication to the vaccine or there is laboratory evidence of immunity to each of the three diseases. A routine second dose of  MMR vaccine should be obtained at least 28 days after the first dose for students attending postsecondary schools, health care workers, or international travelers. People who received inactivated measles vaccine or an unknown type of measles vaccine during 1963-1967 should receive 2 doses of MMR vaccine. People who received inactivated mumps vaccine or an unknown type of mumps vaccine before 1979 and are at high risk for mumps infection should consider immunization with 2 doses of MMR vaccine. Unvaccinated health care workers born before [birth year] who lack laboratory evidence of measles, mumps, or rubella immunity or laboratory confirmation of disease should consider measles and mumps immunization with 2 doses of MMR vaccine or rubella immunization with 1 dose of MMR vaccine.  Pneumococcal 13-valent conjugate (PCV13) vaccine. When indicated, a person who is uncertain of his immunization history and has no record of immunization should receive the PCV13 vaccine. An adult aged 94 years or older who has certain medical conditions and has not been previously immunized should receive 1 dose of PCV13 vaccine. This PCV13 should be followed with a dose of pneumococcal polysaccharide (PPSV23) vaccine. The PPSV23 vaccine dose should be obtained at least 8 weeks after the dose of PCV13 vaccine.  An adult aged 66 years or older who has certain medical conditions and previously received 1 or more doses of PPSV23 vaccine should receive 1 dose of PCV13. The PCV13 vaccine dose should be obtained 1 or more years after the last PPSV23 vaccine dose.  Pneumococcal polysaccharide (PPSV23) vaccine. When PCV13 is also indicated, PCV13 should be obtained first. All adults aged 11 years and older should be immunized. An adult younger than age 83 years who has certain medical conditions should be immunized. Any person who resides in a nursing home or long-term care facility should be immunized. An adult smoker should be immunized. People with an immunocompromised condition and certain other conditions should receive both PCV13 and PPSV23 vaccines. People with human immunodeficiency virus (HIV) infection should be immunized as soon as possible after diagnosis. Immunization during chemotherapy or radiation therapy should be avoided. Routine use of PPSV23 vaccine is not recommended for American Indians, [place] Natives, or people younger than 65 years unless there are medical conditions that require PPSV23 vaccine. When indicated, people who have unknown immunization and have no record of immunization should receive PPSV23 vaccine. One-time revaccination 5 years after the first dose of PPSV23 is recommended for people aged 19-64 years who have chronic kidney failure, nephrotic syndrome, asplenia, or immunocompromised conditions. People who received 1-2 doses of PPSV23 before age 51 years should receive another dose of PPSV23 vaccine at age 38 years or later if at least 5 years have passed since the previous dose. Doses of PPSV23 are not needed for people immunized with PPSV23 at or after age 54 years.  Meningococcal vaccine. Adults with asplenia or persistent complement component deficiencies should receive 2 doses of quadrivalent meningococcal conjugate (MenACWY-D) vaccine. The doses should be obtained at least 2 months  apart. Microbiologists working with certain meningococcal bacteria, [place] recruits, people at risk during an outbreak, and people who travel to or live in countries with a high rate of meningitis should be immunized. A first-year college student up through age 33 years who is living in a residence hall should receive a dose if he did not receive a dose on or after his 16th birthday. Adults who have certain high-risk conditions should receive one or more doses of vaccine.  Hepatitis A vaccine. Adults who wish to be protected from this  disease, have certain high-risk conditions, work with hepatitis A-infected animals, work in hepatitis A research labs, or travel to or work in countries with a high rate of hepatitis A should be immunized. Adults who were previously unvaccinated and who anticipate close contact with an international adoptee during the first 60 days after arrival in the [place] from a country with a high rate of hepatitis A should be immunized.  Hepatitis B vaccine. Adults should be immunized if they wish to be protected from this disease, have certain high-risk conditions, may be exposed to blood or other infectious body fluids, are household contacts or sex partners of hepatitis B positive people, are clients or workers in certain care facilities, or travel to or work in countries with a high rate of hepatitis B.  Haemophilus influenzae type b (Hib) vaccine. A previously unvaccinated person with asplenia or sickle cell disease or having a scheduled splenectomy should receive 1 dose of Hib vaccine. Regardless of previous immunization, a recipient of a hematopoietic stem cell transplant should receive a 3-dose series 6-12 months after his successful transplant. Hib vaccine is not recommended for adults with HIV infection. Preventive Service / Frequency Ages 33 to 91  Blood pressure check.** / Every 1 to 2 years.  Lipid and cholesterol check.** / Every 5 years beginning at age  22.  Hepatitis C blood test.** / For any individual with known risks for hepatitis C.  Skin self-exam. / Monthly.  Influenza vaccine. / Every year.  Tetanus, diphtheria, and acellular pertussis (Tdap, Td) vaccine.** / Consult your health care provider. 1 dose of Td every 10 years.  Varicella vaccine.** / Consult your health care provider.  HPV vaccine. / 3 doses over 6 months, if 4 or younger.  Measles, mumps, rubella (MMR) vaccine.** / You need at least 1 dose of MMR if you were born in 1957 or later. You may also need a second dose.  Pneumococcal 13-valent conjugate (PCV13) vaccine.** / Consult your health care provider.  Pneumococcal polysaccharide (PPSV23) vaccine.** / 1 to 2 doses if you smoke cigarettes or if you have certain conditions.  Meningococcal vaccine.** / 1 dose if you are age 53 to 60 years and a Market researcher living in a residence hall, or have one of several medical conditions. You may also need additional booster doses.  Hepatitis A vaccine.** / Consult your health care provider.  Hepatitis B vaccine.** / Consult your health care provider.  Haemophilus influenzae type b (Hib) vaccine.** / Consult your health care provider. Ages 79 to 66  Blood pressure check.** / Every 1 to 2 years.  Lipid and cholesterol check.** / Every 5 years beginning at age 36.  Lung cancer screening. / Every year if you are aged 27-80 years and have a 30-pack-year history of smoking and currently smoke or have quit within the past 15 years. Yearly screening is stopped once you have quit smoking for at least 15 years or develop a health problem that would prevent you from having lung cancer treatment.  Fecal occult blood test (FOBT) of stool. / Every year beginning at age 53 and continuing until age 77. You may not have to do this test if you get a colonoscopy every 10 years.  Flexible sigmoidoscopy** or colonoscopy.** / Every 5 years for a flexible sigmoidoscopy or every  10 years for a colonoscopy beginning at age 93 and continuing until age 21.  Hepatitis C blood test.** / For all people born from [birth year] through 1965 and any  individual with known risks for hepatitis C.  Skin self-exam. / Monthly.  Influenza vaccine. / Every year.  Tetanus, diphtheria, and acellular pertussis (Tdap/Td) vaccine.** / Consult your health care provider. 1 dose of Td every 10 years.  Varicella vaccine.** / Consult your health care provider.  Zoster vaccine.** / 1 dose for adults aged 35 years or older.  Measles, mumps, rubella (MMR) vaccine.** / You need at least 1 dose of MMR if you were born in 1957 or later. You may also need a second dose.  Pneumococcal 13-valent conjugate (PCV13) vaccine.** / Consult your health care provider.  Pneumococcal polysaccharide (PPSV23) vaccine.** / 1 to 2 doses if you smoke cigarettes or if you have certain conditions.  Meningococcal vaccine.** / Consult your health care provider.  Hepatitis A vaccine.** / Consult your health care provider.  Hepatitis B vaccine.** / Consult your health care provider.  Haemophilus influenzae type b (Hib) vaccine.** / Consult your health care provider. Ages 73 and over  Blood pressure check.** / Every 1 to 2 years.  Lipid and cholesterol check.**/ Every 5 years beginning at age 51.  Lung cancer screening. / Every year if you are aged 8-80 years and have a 30-pack-year history of smoking and currently smoke or have quit within the past 15 years. Yearly screening is stopped once you have quit smoking for at least 15 years or develop a health problem that would prevent you from having lung cancer treatment.  Fecal occult blood test (FOBT) of stool. / Every year beginning at age 58 and continuing until age 106. You may not have to do this test if you get a colonoscopy every 10 years.  Flexible sigmoidoscopy** or colonoscopy.** / Every 5 years for a flexible sigmoidoscopy or every 10 years for a colonoscopy  beginning at age 70 and continuing until age 44.  Hepatitis C blood test.** / For all people born from [birth year] through 1964 and any individual with known risks for hepatitis C.  Abdominal aortic aneurysm (AAA) screening.** / A one-time screening for ages 62 to 3 years who are current or former smokers.  Skin self-exam. / Monthly.  Influenza vaccine. / Every year.  Tetanus, diphtheria, and acellular pertussis (Tdap/Td) vaccine.** / 1 dose of Td every 10 years.  Varicella vaccine.** / Consult your health care provider.  Zoster vaccine.** / 1 dose for adults aged 56 years or older.  Pneumococcal 13-valent conjugate (PCV13) vaccine.** / Consult your health care provider.  Pneumococcal polysaccharide (PPSV23) vaccine.** / 1 dose for all adults aged 40 years and older.  Meningococcal vaccine.** / Consult your health care provider.  Hepatitis A vaccine.** / Consult your health care provider.  Hepatitis B vaccine.** / Consult your health care provider.  Haemophilus influenzae type b (Hib) vaccine.** / Consult your health care provider. **Family history and personal history of risk and conditions may change your health care provider's recommendations. Document Released: 07/07/2001 Document Revised: 05/16/2013 Document Reviewed: 10/06/2010 [facility] Patient Information 2015 [place], [place]. This information is not intended to replace advice given to you by your health care provider. Make sure you discuss any questions you have with your health care provider.

## 2015-02-20 NOTE — Progress Notes (Signed)
Subjective:  Patient ID: Oscar [name], male    DOB: January 13, 1941  Age: 74 y.o. MRN: [id number]  CC: No chief complaint on file.   HPI Oscar [name] presents for a well exam, C/o wt gain. Pt is eating out daily. He is getting married  Outpatient Prescriptions Prior to Visit  Medication Sig Dispense Refill  . ARIPiprazole (ABILIFY) 10 MG tablet Take 10 mg by mouth daily.      Marland Kitchen aspirin 81 MG EC tablet Take 325 mg by mouth daily.     Marland Kitchen atorvastatin (LIPITOR) 40 MG tablet Take 1 tablet (40 mg total) by mouth daily. 30 tablet 11  . cholecalciferol (VITAMIN D) 1000 UNITS tablet Take 1 tablet (1,000 Units total) by mouth daily. 100 tablet 3  . desvenlafaxine (PRISTIQ) 50 MG 24 hr tablet Take 50 mg by mouth daily.    Marland Kitchen losartan (COZAAR) 100 MG tablet Take 1 tablet (100 mg total) by mouth daily. 30 tablet 11  . omeprazole (PRILOSEC) 20 MG capsule Take 40 mg by mouth daily.      . sertraline (ZOLOFT) 100 MG tablet Take 100 mg by mouth daily.      No facility-administered medications prior to visit.    ROS Review of Systems  Constitutional: Positive for unexpected weight change. Negative for appetite change and fatigue.  HENT: Negative for congestion, nosebleeds, sneezing, sore throat and trouble swallowing.   Eyes: Negative for itching and visual disturbance.  Respiratory: Negative for cough.   Cardiovascular: Negative for chest pain, palpitations and leg swelling.  Gastrointestinal: Negative for nausea, diarrhea, blood in stool and abdominal distention.  Genitourinary: Negative for frequency and hematuria.  Musculoskeletal: Positive for arthralgias. Negative for back pain, joint swelling, gait problem and neck pain.  Skin: Negative for rash.  Neurological: Negative for dizziness, tremors, speech difficulty and weakness.  Psychiatric/Behavioral: Negative for sleep disturbance, dysphoric mood and agitation. The patient is not nervous/anxious.     Objective:  BP 140/80 mmHg  Pulse 59   Ht 6\' 1"  (1.854 m)  Wt 229 lb (103.874 kg)  BMI 30.22 kg/m2  SpO2 94%  BP Readings from Last 3 Encounters:  02/20/15 140/80  01/09/15 130/80  08/20/14 120/78    Wt Readings from Last 3 Encounters:  02/20/15 229 lb (103.874 kg)  01/09/15 224 lb 4 oz (101.719 kg)  08/20/14 223 lb (101.152 kg)    Physical Exam  Constitutional: He is oriented to person, place, and time. He appears well-developed and well-nourished. No distress.  HENT:  Head: Normocephalic and atraumatic.  Right Ear: External ear normal.  Left Ear: External ear normal.  Nose: Nose normal.  Mouth/Throat: Oropharynx is clear and moist. No oropharyngeal exudate.  Eyes: Conjunctivae and EOM are normal. Pupils are equal, round, and reactive to light. Right eye exhibits no discharge. Left eye exhibits no discharge. No scleral icterus.  Neck: Normal range of motion. Neck supple. No JVD present. No tracheal deviation present. No thyromegaly present.  Cardiovascular: Normal rate, regular rhythm, normal heart sounds and intact distal pulses.  Exam reveals no gallop and no friction rub.   No murmur heard. Pulmonary/Chest: Effort normal and breath sounds normal. No stridor. No respiratory distress. He has no wheezes. He has no rales. He exhibits no tenderness.  Abdominal: Soft. Bowel sounds are normal. He exhibits no distension and no mass. There is no tenderness. There is no rebound and no guarding.  Musculoskeletal: Normal range of motion. He exhibits no edema or tenderness.  Lymphadenopathy:  He has no cervical adenopathy.  Neurological: He is alert and oriented to person, place, and time. He has normal reflexes. No cranial nerve deficit. He exhibits normal muscle tone. Coordination normal.  Skin: Skin is warm and dry. No rash noted. He is not diaphoretic. No erythema. No pallor.  Psychiatric: He has a normal mood and affect. His behavior is normal. Judgment and thought content normal.  Obese Rectal per Oscar [name]   Lab  Results  Component Value Date   WBC 10.2 02/15/2015   HGB 14.3 02/15/2015   HCT 42.7 02/15/2015   PLT 259.0 02/15/2015   GLUCOSE 95 02/15/2015   CHOL 177 02/15/2015   TRIG 168.0* 02/15/2015   HDL 59.90 02/15/2015   LDLDIRECT 107.8 04/12/2013   LDLCALC 84 02/15/2015   ALT 23 02/15/2015   AST 18 02/15/2015   NA 142 02/15/2015   K 4.1 02/15/2015   CL 106 02/15/2015   CREATININE 0.82 02/15/2015   BUN 13 02/15/2015   CO2 24 02/15/2015   TSH 1.41 02/15/2015   PSA 2.60 02/15/2015   HGBA1C 5.6 03/22/2014    Dg Chest 2 View  02/16/2012   *RADIOLOGY REPORT*  Clinical Data: Shortness of breath.  CHEST - 2 VIEW  Comparison: None.  Findings: Heart and mediastinal contours are within normal limits. No focal opacities or effusions.  No acute bony abnormality.  IMPRESSION: No active cardiopulmonary disease.   Original Report Authenticated By: Raelyn Number, M.D.    Assessment & Plan:   Diagnoses and all orders for this visit:  Well adult exam  Need for influenza vaccination -     Flu Vaccine QUAD 36+ mos IM  Barrett's esophagus  Midline low back pain with sciatica, sciatica laterality unspecified  Adjustment disorder with mixed anxiety and depressed mood  Need for shingles vaccine -     Varicella-zoster vaccine subcutaneous  Weight gain   I am having Oscar [name] maintain his ARIPiprazole, aspirin, omeprazole, sertraline, desvenlafaxine, atorvastatin, losartan, and cholecalciferol.  No orders of the defined types were placed in this encounter.     Follow-up: Return in about 6 months (around 08/20/2015) for a follow-up visit.  Walker Kehr, MD

## 2015-02-20 NOTE — Assessment & Plan Note (Signed)
Doing well 

## 2015-02-20 NOTE — Progress Notes (Signed)
Pre visit review using our clinic review tool, if applicable. No additional management support is needed unless otherwise documented below in the visit note. 

## 2015-03-04 ENCOUNTER — Other Ambulatory Visit: Payer: Self-pay | Admitting: Internal Medicine

## 2015-04-08 ENCOUNTER — Encounter: Payer: Self-pay | Admitting: Gastroenterology

## 2015-04-11 ENCOUNTER — Encounter: Payer: Self-pay | Admitting: Gastroenterology

## 2015-04-17 ENCOUNTER — Encounter: Payer: Self-pay | Admitting: Gastroenterology

## 2015-05-23 ENCOUNTER — Ambulatory Visit (AMBULATORY_SURGERY_CENTER): Payer: Self-pay | Admitting: *Deleted

## 2015-05-23 ENCOUNTER — Telehealth: Payer: Self-pay | Admitting: *Deleted

## 2015-05-23 VITALS — Ht 73.0 in | Wt 229.2 lb

## 2015-05-23 DIAGNOSIS — Z86010 Personal history of colonic polyps: Secondary | ICD-10-CM

## 2015-05-23 DIAGNOSIS — Z800 Family history of malignant neoplasm of digestive organs: Secondary | ICD-10-CM

## 2015-05-23 NOTE — Telephone Encounter (Signed)
Please let Amy know about this approval when done [facility]

## 2015-05-23 NOTE — Telephone Encounter (Signed)
Yes, ok for colon and egd

## 2015-05-23 NOTE — Telephone Encounter (Signed)
Dr Fuller Plan, I just saw Oscar [name] in [facility] for instructions for his colon for hx polyps and [facility] scheduled for 1-11. While doing his PV he asked when his recall egd was as he has a hx of Barrett's. His last egd was 05-26-2012 with a 3 yr recall for 05-2015. He asked if we could do both procedures in January as he needs both at the same time. I went ahead and RS him for a double with you 06-26-2015 and I just want to be sure this is okay with you.   Please advise. I instructed pt if we need to do these at separate times I will notify him after talking to you.  Thanks for your time,  Marijean Niemann

## 2015-05-23 NOTE — Progress Notes (Signed)
No egg or soy allergy known to patient  No issues with past sedation with any surgeries  or procedures, no intubation problems  No diet pills per patient No home 02 use per patient   emmi video declined  Pt is due for an egd for recall barrett's 05-2015. He asked we schedule him as a double since they are both due now.  I did RS him to 06-26-2015 with Dr stark egd/colon and sent TE to Dr Fuller Plan as per Carla Drape to make sure this is ok. Will call pt if we need to change this.  Lelan Pons

## 2015-05-28 NOTE — Telephone Encounter (Signed)
Thank you. Colon/ egd scheduled for 06-26-2015 Marijean Niemann  Amy in pre cert notifies as well, Lelan Pons

## 2015-06-05 ENCOUNTER — Encounter: Payer: Medicare Other | Admitting: Gastroenterology

## 2015-06-26 ENCOUNTER — Encounter: Payer: Self-pay | Admitting: Gastroenterology

## 2015-06-26 ENCOUNTER — Ambulatory Visit (AMBULATORY_SURGERY_CENTER): Payer: PPO | Admitting: Gastroenterology

## 2015-06-26 ENCOUNTER — Encounter: Payer: PPO | Admitting: Gastroenterology

## 2015-06-26 VITALS — BP 117/62 | HR 55 | Temp 96.9°F | Resp 12 | Ht 73.0 in | Wt 229.0 lb

## 2015-06-26 DIAGNOSIS — D122 Benign neoplasm of ascending colon: Secondary | ICD-10-CM | POA: Diagnosis not present

## 2015-06-26 DIAGNOSIS — Z86010 Personal history of colonic polyps: Secondary | ICD-10-CM

## 2015-06-26 DIAGNOSIS — N400 Enlarged prostate without lower urinary tract symptoms: Secondary | ICD-10-CM | POA: Diagnosis not present

## 2015-06-26 DIAGNOSIS — K2270 Barrett's esophagus without dysplasia: Secondary | ICD-10-CM | POA: Diagnosis not present

## 2015-06-26 DIAGNOSIS — Z800 Family history of malignant neoplasm of digestive organs: Secondary | ICD-10-CM

## 2015-06-26 DIAGNOSIS — K635 Polyp of colon: Secondary | ICD-10-CM | POA: Diagnosis not present

## 2015-06-26 MED ORDER — SODIUM CHLORIDE 0.9 % IV SOLN: 500.0000 mL | INTRAVENOUS | Status: DC

## 2015-06-26 NOTE — Patient Instructions (Signed)

## 2015-06-26 NOTE — Progress Notes (Signed)
A/ox3, pleased with MAC, report to RN 

## 2015-06-26 NOTE — Progress Notes (Signed)
Called to room to assist during endoscopic procedure.  Patient ID and intended procedure confirmed with present staff. Received instructions for my participation in the procedure from the performing physician.  

## 2015-06-26 NOTE — Op Note (Signed)
[place]  [address] [place], [id number]   COLONOSCOPY PROCEDURE REPORT  PATIENT: Oscar [name], Oscar [name]  MR#: [id number] BIRTHDATE: 1941-03-21 , 74  yrs. old GENDER: male ENDOSCOPIST: Ladene Artist, MD, [facility] PROCEDURE DATE:  06/26/2015 PROCEDURE:   Colonoscopy, surveillance and Colonoscopy with snare polypectomy First Screening Colonoscopy - Avg.  risk and is 50 yrs.  old or older - No.  Prior Negative Screening - Now for repeat screening. N/A  History of Adenoma - Now for follow-up colonoscopy & has been > or = to 3 yrs.  Yes hx of adenoma.  Has been 3 or more years since last colonoscopy.  Polyps removed today? Yes ASA CLASS:   Class II INDICATIONS:Surveillance due to prior colonic neoplasia, FH Colon Adenoma, and FH Colon or Rectal Adenocarcinoma. MEDICATIONS: Monitored anesthesia care and Propofol 300 mg IV DESCRIPTION OF PROCEDURE:   After the risks benefits and alternatives of the procedure were thoroughly explained, informed consent was obtained.  The digital rectal exam revealed no abnormalities of the rectum.   The LB [id number]  endoscope was introduced through the anus and advanced to the cecum, which was identified by both the appendix and ileocecal valve. No adverse events experienced.   The quality of the prep was good.  (Suprep was used)  The instrument was then slowly withdrawn as the colon was fully examined. Estimated blood loss is zero unless otherwise noted in this procedure report.    COLON FINDINGS: A semi-pedunculated polyp measuring 9 mm in size was found in the ascending colon.  A polypectomy was performed using snare cautery.  The resection was complete, the polyp tissue was completely retrieved and sent to histology.   There was mild diverticulosis noted in the sigmoid colon.   The examination was otherwise normal.  Retroflexed views revealed internal Grade I hemorrhoids. The time to cecum = 3.6 Withdrawal time = 11.9    The scope was withdrawn and the procedure completed. COMPLICATIONS: There were no immediate complications.  ENDOSCOPIC IMPRESSION: 1.   Semi-pedunculated polyp in the ascending colon; polypectomy performed using snare cautery 2.   Mild diverticulosis in the sigmoid colon 3.   Grade l internal hemorrhoids  RECOMMENDATIONS: 1.  Await pathology results 2.  Hold Aspirin and all other NSAIDS for 2 weeks. 3.  High fiber diet with liberal fluid intake. 4.  Repeat Colonoscopy in 5 years.  eSigned:  Ladene Artist, MD, [facility] 06/26/2015 3:17 PM

## 2015-06-26 NOTE — Op Note (Signed)
[place]  [address] [place], [address]   ENDOSCOPY PROCEDURE REPORT  PATIENT: Dix, Hovel  MR#: [id number] BIRTHDATE: 04/09/1941 , 74  yrs. old GENDER: male ENDOSCOPIST: Ladene Artist, MD, [facility] PROCEDURE DATE:  06/26/2015 PROCEDURE:  EGD w/ biopsy ASA CLASS:     Class II INDICATIONS:  history of Barrett's esophagus. MEDICATIONS: Monitored anesthesia care, Residual sedation present, and Propofol 150 mg IV TOPICAL ANESTHETIC: none DESCRIPTION OF PROCEDURE: After the risks benefits and alternatives of the procedure were thoroughly explained, informed consent was obtained.  The LB [id number] endoscope was introduced through the mouth and advanced to the second portion of the duodenum , Without limitations.  The instrument was slowly withdrawn as the mucosa was fully examined.    ESOPHAGUS: There was a 4cm segment of Barrett's esophagus without dysplasia found in the distal esophagus. Barrett's Surveillance (4 quadrant biopsies).  The esophagus otherwise appeared normal. STOMACH: The mucosa and folds of the stomach appeared normal. DUODENUM: A medium sized diverticulum was found in the 2nd part of the duodenum.  Retroflexed views revealed a 5 cm hiatal hernia. The scope was then withdrawn from the patient and the procedure completed.  COMPLICATIONS: There were no immediate complications.  ENDOSCOPIC IMPRESSION: 1.   Barrett's esophagus in the distal esophagus 2.   Medium sized hiatal hernia 3.   Diverticulum  in the 2nd part of the duodenum  RECOMMENDATIONS: 1.  Await pathology results 2.  Anti-reflux regimen to be follow 3.  Continue current meds 4.  Surveillance EGD in 3 years if no dysplasia  eSigned:  Ladene Artist, MD, [facility] 06/26/2015 3:24 PM

## 2015-06-27 ENCOUNTER — Telehealth: Payer: Self-pay | Admitting: *Deleted

## 2015-06-27 NOTE — Telephone Encounter (Signed)
  Follow up Call-  Call back number 06/26/2015  Post procedure Call Back phone  # [phone]  Permission to leave phone message Yes     Patient questions:  Do you have a fever, pain , or abdominal swelling? No. Pain Score  0 *  Have you tolerated food without any problems? Yes.    Have you been able to return to your normal activities? Yes.    Do you have any questions about your discharge instructions: Diet   No. Medications  No. Follow up visit  No.  Do you have questions or concerns about your Care? No.  Actions: * If pain score is 4 or above: No action needed, pain <4.

## 2015-07-02 ENCOUNTER — Encounter: Payer: Self-pay | Admitting: Gastroenterology

## 2015-08-15 ENCOUNTER — Ambulatory Visit (INDEPENDENT_AMBULATORY_CARE_PROVIDER_SITE_OTHER): Payer: PPO | Admitting: Ophthalmology

## 2015-08-15 ENCOUNTER — Ambulatory Visit (INDEPENDENT_AMBULATORY_CARE_PROVIDER_SITE_OTHER): Payer: Medicare Other | Admitting: Ophthalmology

## 2015-08-15 DIAGNOSIS — H353132 Nonexudative age-related macular degeneration, bilateral, intermediate dry stage: Secondary | ICD-10-CM

## 2015-08-15 DIAGNOSIS — H35343 Macular cyst, hole, or pseudohole, bilateral: Secondary | ICD-10-CM

## 2015-08-15 DIAGNOSIS — H43813 Vitreous degeneration, bilateral: Secondary | ICD-10-CM | POA: Diagnosis not present

## 2015-08-21 ENCOUNTER — Ambulatory Visit (INDEPENDENT_AMBULATORY_CARE_PROVIDER_SITE_OTHER): Payer: PPO | Admitting: Internal Medicine

## 2015-08-21 ENCOUNTER — Other Ambulatory Visit (INDEPENDENT_AMBULATORY_CARE_PROVIDER_SITE_OTHER): Payer: PPO

## 2015-08-21 ENCOUNTER — Encounter: Payer: Self-pay | Admitting: Internal Medicine

## 2015-08-21 VITALS — BP 128/70 | HR 55 | Wt 228.0 lb

## 2015-08-21 DIAGNOSIS — I10 Essential (primary) hypertension: Secondary | ICD-10-CM

## 2015-08-21 DIAGNOSIS — R635 Abnormal weight gain: Secondary | ICD-10-CM

## 2015-08-21 DIAGNOSIS — D126 Benign neoplasm of colon, unspecified: Secondary | ICD-10-CM

## 2015-08-21 DIAGNOSIS — M5440 Lumbago with sciatica, unspecified side: Secondary | ICD-10-CM | POA: Diagnosis not present

## 2015-08-21 DIAGNOSIS — K2270 Barrett's esophagus without dysplasia: Secondary | ICD-10-CM

## 2015-08-21 LAB — BASIC METABOLIC PANEL
BUN: 12 mg/dL (ref 6–23)
CHLORIDE: 105 meq/L (ref 96–112)
CO2: 27 meq/L (ref 19–32)
Calcium: 9.2 mg/dL (ref 8.4–10.5)
Creatinine, Ser: 0.79 mg/dL (ref 0.40–1.50)
GFR: 101.92 mL/min (ref >60.00)
GLUCOSE: 87 mg/dL (ref 70–99)
POTASSIUM: 4.2 meq/L (ref 3.5–5.1)
Sodium: 139 mEq/L (ref 135–145)

## 2015-08-21 NOTE — Assessment & Plan Note (Signed)
On Omeprazole EGD 2017 Dr Fuller Plan

## 2015-08-21 NOTE — Assessment & Plan Note (Signed)
Dr Fuller Plan Colon due 2020

## 2015-08-21 NOTE — Assessment & Plan Note (Signed)
Chronic sx's OTC meds prn

## 2015-08-21 NOTE — Assessment & Plan Note (Signed)
Wt Readings from Last 3 Encounters:  08/21/15 228 lb (103.42 kg)  06/26/15 229 lb (103.874 kg)  05/23/15 229 lb 3.2 oz (103.964 kg)

## 2015-08-21 NOTE — Assessment & Plan Note (Signed)
Losartan 

## 2015-08-21 NOTE — Progress Notes (Signed)
Pre visit review using our clinic review tool, if applicable. No additional management support is needed unless otherwise documented below in the visit note. 

## 2015-08-21 NOTE — Progress Notes (Signed)
Subjective:  Patient ID: Oscar [name], male    DOB: 1940-06-27  Age: 75 y.o. MRN: [id number]  CC: No chief complaint on file.   HPI Oscar [name] presents for 6 mo f/u. Getting married in 1 week. F/u HTN, dyslipidemia, anxiety f/u  Outpatient Prescriptions Prior to Visit  Medication Sig Dispense Refill  . ARIPiprazole (ABILIFY) 10 MG tablet Take 10 mg by mouth daily.      Marland Kitchen aspirin 81 MG EC tablet Take 325 mg by mouth daily.     Marland Kitchen atorvastatin (LIPITOR) 40 MG tablet take 1 tablet by mouth once daily 30 tablet 11  . desvenlafaxine (PRISTIQ) 50 MG 24 hr tablet Take 50 mg by mouth daily.    Marland Kitchen losartan (COZAAR) 100 MG tablet take 1 tablet by mouth once daily 30 tablet 11  . omeprazole (PRILOSEC) 20 MG capsule Take 40 mg by mouth daily.      . sertraline (ZOLOFT) 100 MG tablet Take 100 mg by mouth daily.     . tamsulosin (FLOMAX) 0.4 MG CAPS capsule Take 0.4 mg by mouth daily.     No facility-administered medications prior to visit.    ROS Review of Systems  Constitutional: Negative for appetite change, fatigue and unexpected weight change.  HENT: Negative for congestion, nosebleeds, sneezing, sore throat and trouble swallowing.   Eyes: Negative for itching and visual disturbance.  Respiratory: Negative for cough.   Cardiovascular: Negative for chest pain, palpitations and leg swelling.  Gastrointestinal: Negative for nausea, diarrhea, blood in stool and abdominal distention.  Genitourinary: Negative for frequency and hematuria.  Musculoskeletal: Negative for back pain, joint swelling, gait problem and neck pain.  Skin: Negative for rash.  Neurological: Negative for dizziness, tremors, speech difficulty and weakness.  Psychiatric/Behavioral: Negative for suicidal ideas, sleep disturbance, dysphoric mood and agitation. The patient is not nervous/anxious.     Objective:  BP 128/70 mmHg  Pulse 55  Wt 228 lb (103.42 kg)  SpO2 97%  BP Readings from Last 3 Encounters:  08/21/15  128/70  06/26/15 117/62  02/20/15 140/80    Wt Readings from Last 3 Encounters:  08/21/15 228 lb (103.42 kg)  06/26/15 229 lb (103.874 kg)  05/23/15 229 lb 3.2 oz (103.964 kg)    Physical Exam  Constitutional: He is oriented to person, place, and time. He appears well-developed. No distress.  NAD  HENT:  Mouth/Throat: Oropharynx is clear and moist.  Eyes: Conjunctivae are normal. Pupils are equal, round, and reactive to light.  Neck: Normal range of motion. No JVD present. No thyromegaly present.  Cardiovascular: Normal rate, regular rhythm, normal heart sounds and intact distal pulses.  Exam reveals no gallop and no friction rub.   No murmur heard. Pulmonary/Chest: Effort normal and breath sounds normal. No respiratory distress. He has no wheezes. He has no rales. He exhibits no tenderness.  Abdominal: Soft. Bowel sounds are normal. He exhibits no distension and no mass. There is no tenderness. There is no rebound and no guarding.  Musculoskeletal: Normal range of motion. He exhibits no edema or tenderness.  Lymphadenopathy:    He has no cervical adenopathy.  Neurological: He is alert and oriented to person, place, and time. He has normal reflexes. No cranial nerve deficit. He exhibits normal muscle tone. He displays a negative Romberg sign. Coordination and gait normal.  Skin: Skin is warm and dry. No rash noted.  Psychiatric: He has a normal mood and affect. His behavior is normal. Judgment and thought content normal.  Lab Results  Component Value Date   WBC 10.2 02/15/2015   HGB 14.3 02/15/2015   HCT 42.7 02/15/2015   PLT 259.0 02/15/2015   GLUCOSE 95 02/15/2015   CHOL 177 02/15/2015   TRIG 168.0* 02/15/2015   HDL 59.90 02/15/2015   LDLDIRECT 107.8 04/12/2013   LDLCALC 84 02/15/2015   ALT 23 02/15/2015   AST 18 02/15/2015   NA 142 02/15/2015   K 4.1 02/15/2015   CL 106 02/15/2015   CREATININE 0.82 02/15/2015   BUN 13 02/15/2015   CO2 24 02/15/2015   TSH 1.41  02/15/2015   PSA 2.60 02/15/2015   HGBA1C 5.6 03/22/2014    Dg Chest 2 View  02/16/2012  *RADIOLOGY REPORT* Clinical Data: Shortness of breath. CHEST - 2 VIEW Comparison: None. Findings: Heart and mediastinal contours are within normal limits. No focal opacities or effusions.  No acute bony abnormality. IMPRESSION: No active cardiopulmonary disease. Original Report Authenticated By: Raelyn Number, M.D.    Assessment & Plan:   There are no diagnoses linked to this encounter. I am having Mr. Leiman maintain his ARIPiprazole, aspirin, omeprazole, sertraline, desvenlafaxine, losartan, atorvastatin, and tamsulosin.  No orders of the defined types were placed in this encounter.     Follow-up: No Follow-up on file.  Walker Kehr, MD

## 2015-08-22 ENCOUNTER — Ambulatory Visit (INDEPENDENT_AMBULATORY_CARE_PROVIDER_SITE_OTHER): Payer: Medicare Other | Admitting: Ophthalmology

## 2015-10-10 DIAGNOSIS — L821 Other seborrheic keratosis: Secondary | ICD-10-CM | POA: Diagnosis not present

## 2015-10-31 DIAGNOSIS — D2270 Melanocytic nevi of unspecified lower limb, including hip: Secondary | ICD-10-CM | POA: Diagnosis not present

## 2015-10-31 DIAGNOSIS — D225 Melanocytic nevi of trunk: Secondary | ICD-10-CM | POA: Diagnosis not present

## 2015-10-31 DIAGNOSIS — D1801 Hemangioma of skin and subcutaneous tissue: Secondary | ICD-10-CM | POA: Diagnosis not present

## 2015-10-31 DIAGNOSIS — D2260 Melanocytic nevi of unspecified upper limb, including shoulder: Secondary | ICD-10-CM | POA: Diagnosis not present

## 2015-10-31 DIAGNOSIS — Z85820 Personal history of malignant melanoma of skin: Secondary | ICD-10-CM | POA: Diagnosis not present

## 2015-10-31 DIAGNOSIS — L821 Other seborrheic keratosis: Secondary | ICD-10-CM | POA: Diagnosis not present

## 2015-11-18 ENCOUNTER — Other Ambulatory Visit: Payer: Self-pay | Admitting: *Deleted

## 2015-11-18 MED ORDER — VITAMIN D 1000 UNITS PO TABS: 1000.0000 [IU] | ORAL_TABLET | Freq: Every day | ORAL | Status: DC

## 2016-01-28 DIAGNOSIS — L723 Sebaceous cyst: Secondary | ICD-10-CM | POA: Diagnosis not present

## 2016-02-21 ENCOUNTER — Encounter: Payer: PPO | Admitting: Internal Medicine

## 2016-02-26 ENCOUNTER — Other Ambulatory Visit (INDEPENDENT_AMBULATORY_CARE_PROVIDER_SITE_OTHER): Payer: PPO

## 2016-02-26 ENCOUNTER — Ambulatory Visit (INDEPENDENT_AMBULATORY_CARE_PROVIDER_SITE_OTHER)
Admission: RE | Admit: 2016-02-26 | Discharge: 2016-02-26 | Disposition: A | Discharge status: Home / Self Care | Payer: PPO | Source: Ambulatory Visit | Attending: Internal Medicine | Admitting: Internal Medicine

## 2016-02-26 ENCOUNTER — Ambulatory Visit (INDEPENDENT_AMBULATORY_CARE_PROVIDER_SITE_OTHER): Payer: PPO | Admitting: Internal Medicine

## 2016-02-26 ENCOUNTER — Other Ambulatory Visit: Payer: PPO

## 2016-02-26 ENCOUNTER — Encounter: Payer: Self-pay | Admitting: Internal Medicine

## 2016-02-26 VITALS — BP 130/68 | HR 68 | Temp 99.0°F | Ht 73.0 in | Wt 229.0 lb

## 2016-02-26 DIAGNOSIS — E785 Hyperlipidemia, unspecified: Secondary | ICD-10-CM | POA: Diagnosis not present

## 2016-02-26 DIAGNOSIS — Z23 Encounter for immunization: Secondary | ICD-10-CM | POA: Diagnosis not present

## 2016-02-26 DIAGNOSIS — K2270 Barrett's esophagus without dysplasia: Secondary | ICD-10-CM

## 2016-02-26 DIAGNOSIS — L309 Dermatitis, unspecified: Secondary | ICD-10-CM | POA: Insufficient documentation

## 2016-02-26 DIAGNOSIS — R0600 Dyspnea, unspecified: Secondary | ICD-10-CM | POA: Diagnosis not present

## 2016-02-26 DIAGNOSIS — K2100 Gastro-esophageal reflux disease with esophagitis, without bleeding: Secondary | ICD-10-CM

## 2016-02-26 DIAGNOSIS — Z0000 Encounter for general adult medical examination without abnormal findings: Secondary | ICD-10-CM

## 2016-02-26 DIAGNOSIS — R8290 Unspecified abnormal findings in urine: Secondary | ICD-10-CM

## 2016-02-26 DIAGNOSIS — R0609 Other forms of dyspnea: Secondary | ICD-10-CM

## 2016-02-26 DIAGNOSIS — L308 Other specified dermatitis: Secondary | ICD-10-CM | POA: Diagnosis not present

## 2016-02-26 LAB — URINALYSIS
BILIRUBIN URINE: NEGATIVE
Hgb urine dipstick: NEGATIVE
Ketones, ur: NEGATIVE
Leukocytes, UA: NEGATIVE
NITRITE: NEGATIVE
PH: 7 (ref 5.0–8.0)
SPECIFIC GRAVITY, URINE: 1.015 (ref 1.000–1.030)
TOTAL PROTEIN, URINE-UPE24: NEGATIVE
Urine Glucose: NEGATIVE
Urobilinogen, UA: 0.2 (ref 0.0–1.0)

## 2016-02-26 LAB — BASIC METABOLIC PANEL
BUN: 11 mg/dL (ref 6–23)
CO2: 25 mEq/L (ref 19–32)
Calcium: 8.5 mg/dL (ref 8.4–10.5)
Chloride: 105 mEq/L (ref 96–112)
Creatinine, Ser: 0.88 mg/dL (ref 0.40–1.50)
GFR: 89.86 mL/min (ref >60.00)
Glucose, Bld: 92 mg/dL (ref 70–99)
Potassium: 4.4 mEq/L (ref 3.5–5.1)
Sodium: 140 mEq/L (ref 135–145)

## 2016-02-26 LAB — LIPID PANEL
Cholesterol: 176 mg/dL (ref 0–200)
HDL: 65.1 mg/dL (ref >39.00)
LDL Cholesterol: 78 mg/dL (ref 0–99)
NonHDL: 110.47
Total CHOL/HDL Ratio: 3
Triglycerides: 163 mg/dL — ABNORMAL HIGH (ref 0.0–149.0)
VLDL: 32.6 mg/dL (ref 0.0–40.0)

## 2016-02-26 LAB — CBC WITH DIFFERENTIAL/PLATELET
Basophils Absolute: 0 10*3/uL (ref 0.0–0.1)
Basophils Relative: 0.3 % (ref 0.0–3.0)
Eosinophils Absolute: 0.3 10*3/uL (ref 0.0–0.7)
Eosinophils Relative: 3.4 % (ref 0.0–5.0)
HCT: 41.2 % (ref 39.0–52.0)
Hemoglobin: 14 g/dL (ref 13.0–17.0)
Lymphocytes Relative: 24.7 % (ref 12.0–46.0)
Lymphs Abs: 2.2 10*3/uL (ref 0.7–4.0)
MCHC: 34.1 g/dL (ref 30.0–36.0)
MCV: 88.4 fl (ref 78.0–100.0)
Monocytes Absolute: 0.6 10*3/uL (ref 0.1–1.0)
Monocytes Relative: 7.3 % (ref 3.0–12.0)
Neutro Abs: 5.7 10*3/uL (ref 1.4–7.7)
Neutrophils Relative %: 64.3 % (ref 43.0–77.0)
Platelets: 245 10*3/uL (ref 150.0–400.0)
RBC: 4.66 Mil/uL (ref 4.22–5.81)
RDW: 13.9 % (ref 11.5–15.5)
WBC: 8.8 10*3/uL (ref 4.0–10.5)

## 2016-02-26 LAB — PSA: PSA: 4.29 ng/mL — AB (ref 0.10–4.00)

## 2016-02-26 LAB — HEPATIC FUNCTION PANEL
ALT: 27 U/L (ref 0–53)
AST: 23 U/L (ref 0–37)
Albumin: 4 g/dL (ref 3.5–5.2)
Alkaline Phosphatase: 68 U/L (ref 39–117)
Bilirubin, Direct: 0.1 mg/dL (ref 0.0–0.3)
Total Bilirubin: 0.6 mg/dL (ref 0.2–1.2)
Total Protein: 6.7 g/dL (ref 6.0–8.3)

## 2016-02-26 LAB — TSH: TSH: 1.61 u[IU]/mL (ref 0.35–4.50)

## 2016-02-26 MED ORDER — TRIAMCINOLONE ACETONIDE 0.5 % EX OINT: 1.0000 "application " | TOPICAL_OINTMENT | Freq: Two times a day (BID) | CUTANEOUS | 2 refills | Status: AC

## 2016-02-26 MED ORDER — LOSARTAN POTASSIUM 100 MG PO TABS: 100.0000 mg | ORAL_TABLET | Freq: Every day | ORAL | 11 refills | Status: DC

## 2016-02-26 MED ORDER — VITAMIN D 1000 UNITS PO TABS: 1000.0000 [IU] | ORAL_TABLET | Freq: Every day | ORAL | 3 refills | Status: DC

## 2016-02-26 MED ORDER — ATORVASTATIN CALCIUM 40 MG PO TABS: 40.0000 mg | ORAL_TABLET | Freq: Every day | ORAL | 11 refills | Status: DC

## 2016-02-26 NOTE — Progress Notes (Signed)
Pre visit review using our clinic review tool, if applicable. No additional management support is needed unless otherwise documented below in the visit note. 

## 2016-02-26 NOTE — Assessment & Plan Note (Signed)

## 2016-02-26 NOTE — Assessment & Plan Note (Signed)
Labs Lipitor 

## 2016-02-26 NOTE — Assessment & Plan Note (Signed)
Chronic L shin w/hyperpigmentation Triamc oint Rx

## 2016-02-26 NOTE — Patient Instructions (Signed)

## 2016-02-26 NOTE — Assessment & Plan Note (Signed)
Chronic w/moderate HH Dr Fuller Plan On Omeprazole

## 2016-02-26 NOTE — Progress Notes (Signed)
Subjective:  Patient ID: Oscar [name], male    DOB: 02/16/1941  Age: 75 y.o. MRN: [id number]  CC: No chief complaint on file.   HPI Oscar [name] presents for a well exam C/o SOB sometimes when tying his shoes etc. C/o rash on the L LE  Outpatient Medications Prior to Visit  Medication Sig Dispense Refill  . ARIPiprazole (ABILIFY) 10 MG tablet Take 10 mg by mouth daily.      Marland Kitchen aspirin 81 MG EC tablet Take 325 mg by mouth daily.     Marland Kitchen atorvastatin (LIPITOR) 40 MG tablet take 1 tablet by mouth once daily 30 tablet 11  . cholecalciferol (VITAMIN D) 1000 units tablet Take 1 tablet (1,000 Units total) by mouth daily. 90 tablet 0  . desvenlafaxine (PRISTIQ) 50 MG 24 hr tablet Take 50 mg by mouth daily.    Marland Kitchen losartan (COZAAR) 100 MG tablet take 1 tablet by mouth once daily 30 tablet 11  . omeprazole (PRILOSEC) 20 MG capsule Take 40 mg by mouth daily.      . sertraline (ZOLOFT) 100 MG tablet Take 100 mg by mouth daily.     . tamsulosin (FLOMAX) 0.4 MG CAPS capsule Take 0.4 mg by mouth daily.     No facility-administered medications prior to visit.     ROS Review of Systems  Constitutional: Negative for appetite change, fatigue and unexpected weight change.  HENT: Negative for congestion, nosebleeds, sneezing, sore throat and trouble swallowing.   Eyes: Negative for itching and visual disturbance.  Respiratory: Positive for shortness of breath. Negative for cough.   Cardiovascular: Negative for chest pain, palpitations and leg swelling.  Gastrointestinal: Negative for abdominal distention, blood in stool, diarrhea and nausea.  Genitourinary: Negative for frequency and hematuria.  Musculoskeletal: Negative for back pain, gait problem, joint swelling and neck pain.  Skin: Negative for rash.  Neurological: Negative for dizziness, tremors, speech difficulty and weakness.  Psychiatric/Behavioral: Negative for agitation, dysphoric mood and sleep disturbance. The patient is not  nervous/anxious.     Objective:  BP 130/68   Pulse 68   Temp 99 F (37.2 C) (Oral)   Ht 6\' 1"  (1.854 m)   Wt 229 lb (103.9 kg)   SpO2 96%   BMI 30.21 kg/m   BP Readings from Last 3 Encounters:  02/26/16 130/68  08/21/15 128/70  06/26/15 117/62    Wt Readings from Last 3 Encounters:  02/26/16 229 lb (103.9 kg)  08/21/15 228 lb (103.4 kg)  06/26/15 229 lb (103.9 kg)    Physical Exam  Constitutional: He is oriented to person, place, and time. He appears well-developed and well-nourished. No distress.  HENT:  Head: Normocephalic and atraumatic.  Right Ear: External ear normal.  Left Ear: External ear normal.  Nose: Nose normal.  Mouth/Throat: Oropharynx is clear and moist. No oropharyngeal exudate.  Eyes: Conjunctivae and EOM are normal. Pupils are equal, round, and reactive to light. Right eye exhibits no discharge. Left eye exhibits no discharge. No scleral icterus.  Neck: Normal range of motion. Neck supple. No JVD present. No tracheal deviation present. No thyromegaly present.  Cardiovascular: Normal rate, regular rhythm, normal heart sounds and intact distal pulses.  Exam reveals no gallop and no friction rub.   No murmur heard. Pulmonary/Chest: Effort normal and breath sounds normal. No stridor. No respiratory distress. He has no wheezes. He has no rales. He exhibits no tenderness.  Abdominal: Soft. Bowel sounds are normal. He exhibits no distension and no mass. There  is no tenderness. There is no rebound and no guarding.  Genitourinary: Penis normal. Rectal exam shows guaiac positive stool. No penile tenderness.  Musculoskeletal: Normal range of motion. He exhibits no edema or tenderness.  Lymphadenopathy:    He has no cervical adenopathy.  Neurological: He is alert and oriented to person, place, and time. He has normal reflexes. No cranial nerve deficit. He exhibits normal muscle tone. Coordination normal.  Skin: Skin is warm and dry. No rash noted. He is not  diaphoretic. No erythema. No pallor.  Psychiatric: He has a normal mood and affect. His behavior is normal. Judgment and thought content normal.  prostate 1+ Obese Rash on L lat shin Chest keloid scar  Procedure: EKG Indication: SOB Impression: S brady. AFB. No new changes.   Lab Results  Component Value Date   WBC 10.2 02/15/2015   HGB 14.3 02/15/2015   HCT 42.7 02/15/2015   PLT 259.0 02/15/2015   GLUCOSE 87 08/21/2015   CHOL 177 02/15/2015   TRIG 168.0 (H) 02/15/2015   HDL 59.90 02/15/2015   LDLDIRECT 107.8 04/12/2013   LDLCALC 84 02/15/2015   ALT 23 02/15/2015   AST 18 02/15/2015   NA 139 08/21/2015   K 4.2 08/21/2015   CL 105 08/21/2015   CREATININE 0.79 08/21/2015   BUN 12 08/21/2015   CO2 27 08/21/2015   TSH 1.41 02/15/2015   PSA 2.60 02/15/2015   HGBA1C 5.6 03/22/2014    Dg Chest 2 View  Result Date: 02/16/2012 *RADIOLOGY REPORT* Clinical Data: Shortness of breath. CHEST - 2 VIEW Comparison: None. Findings: Heart and mediastinal contours are within normal limits. No focal opacities or effusions.  No acute bony abnormality. IMPRESSION: No active cardiopulmonary disease. Original Report Authenticated By: Raelyn Number, M.D.    Assessment & Plan:   Diagnoses and all orders for this visit:  Need for influenza vaccination -     Flu vaccine HIGH DOSE PF  Other orders -     Cancel: atorvastatin (LIPITOR) 40 MG tablet; Take 1 tablet (40 mg total) by mouth daily. -     Cancel: cholecalciferol (VITAMIN D) 1000 units tablet; Take 1 tablet (1,000 Units total) by mouth daily. -     Cancel: losartan (COZAAR) 100 MG tablet; Take 1 tablet (100 mg total) by mouth daily. -     atorvastatin (LIPITOR) 40 MG tablet; Take 1 tablet (40 mg total) by mouth daily. -     losartan (COZAAR) 100 MG tablet; Take 1 tablet (100 mg total) by mouth daily. -     cholecalciferol (VITAMIN D) 1000 units tablet; Take 1 tablet (1,000 Units total) by mouth daily.   I am having Mr. Pelly  maintain his ARIPiprazole, aspirin, omeprazole, sertraline, desvenlafaxine, losartan, atorvastatin, tamsulosin, and cholecalciferol.  No orders of the defined types were placed in this encounter.    Follow-up: No Follow-up on file.  Walker Kehr, MD

## 2016-02-26 NOTE — Assessment & Plan Note (Addendum)
2017 Likely HH is contributing to SOB - discussed options. EKG, CXR, Labs Pt declined a stress test Stay active

## 2016-04-08 ENCOUNTER — Other Ambulatory Visit: Payer: Self-pay | Admitting: *Deleted

## 2016-04-08 DIAGNOSIS — N401 Enlarged prostate with lower urinary tract symptoms: Secondary | ICD-10-CM | POA: Diagnosis not present

## 2016-04-08 MED ORDER — LOSARTAN POTASSIUM 100 MG PO TABS: 100.0000 mg | ORAL_TABLET | Freq: Every day | ORAL | 4 refills | Status: DC

## 2016-04-15 DIAGNOSIS — R972 Elevated prostate specific antigen [PSA]: Secondary | ICD-10-CM | POA: Diagnosis not present

## 2016-04-15 DIAGNOSIS — R350 Frequency of micturition: Secondary | ICD-10-CM | POA: Diagnosis not present

## 2016-04-15 DIAGNOSIS — R3915 Urgency of urination: Secondary | ICD-10-CM | POA: Diagnosis not present

## 2016-04-15 DIAGNOSIS — N400 Enlarged prostate without lower urinary tract symptoms: Secondary | ICD-10-CM | POA: Diagnosis not present

## 2016-04-15 DIAGNOSIS — R310 Gross hematuria: Secondary | ICD-10-CM | POA: Diagnosis not present

## 2016-04-27 DIAGNOSIS — R319 Hematuria, unspecified: Secondary | ICD-10-CM | POA: Diagnosis not present

## 2016-04-27 DIAGNOSIS — R310 Gross hematuria: Secondary | ICD-10-CM | POA: Diagnosis not present

## 2016-05-06 DIAGNOSIS — Z85820 Personal history of malignant melanoma of skin: Secondary | ICD-10-CM | POA: Diagnosis not present

## 2016-05-06 DIAGNOSIS — L821 Other seborrheic keratosis: Secondary | ICD-10-CM | POA: Diagnosis not present

## 2016-05-06 DIAGNOSIS — D1801 Hemangioma of skin and subcutaneous tissue: Secondary | ICD-10-CM | POA: Diagnosis not present

## 2016-05-06 DIAGNOSIS — D225 Melanocytic nevi of trunk: Secondary | ICD-10-CM | POA: Diagnosis not present

## 2016-05-06 DIAGNOSIS — L814 Other melanin hyperpigmentation: Secondary | ICD-10-CM | POA: Diagnosis not present

## 2016-06-02 DIAGNOSIS — R310 Gross hematuria: Secondary | ICD-10-CM | POA: Diagnosis not present

## 2016-06-02 DIAGNOSIS — N400 Enlarged prostate without lower urinary tract symptoms: Secondary | ICD-10-CM | POA: Diagnosis not present

## 2016-06-02 DIAGNOSIS — R972 Elevated prostate specific antigen [PSA]: Secondary | ICD-10-CM | POA: Diagnosis not present

## 2016-08-10 ENCOUNTER — Telehealth: Payer: Self-pay | Admitting: Internal Medicine

## 2016-08-10 NOTE — Telephone Encounter (Signed)
Pt is wanting a whooping cough vaccine. There is a new baby coming into the family, which is due next week and he would like to have this done before.  Can he come in for a nurse visit for this.  Please advise

## 2016-08-10 NOTE — Telephone Encounter (Signed)
congrats! OK tDAP Thx

## 2016-08-11 MED ORDER — TETANUS-DIPHTH-ACELL PERTUSSIS 5-2.5-18.5 LF-MCG/0.5 IM SUSP: 0.5000 mL | Freq: Once | INTRAMUSCULAR | 0 refills | Status: AC

## 2016-08-11 NOTE — Telephone Encounter (Signed)
T-dap sent to [facility]. Patient notified

## 2016-08-11 NOTE — Telephone Encounter (Signed)
Did he not want to come to the Office? Thx

## 2016-08-12 NOTE — Telephone Encounter (Signed)
Patient will have pharmacy give injection

## 2016-08-13 ENCOUNTER — Encounter: Payer: Self-pay | Admitting: Internal Medicine

## 2016-08-13 NOTE — Progress Notes (Signed)
T-dap abstracted into system from [facility]

## 2016-08-19 ENCOUNTER — Ambulatory Visit (INDEPENDENT_AMBULATORY_CARE_PROVIDER_SITE_OTHER): Payer: PPO | Admitting: Ophthalmology

## 2016-08-19 DIAGNOSIS — I10 Essential (primary) hypertension: Secondary | ICD-10-CM | POA: Diagnosis not present

## 2016-08-19 DIAGNOSIS — H353132 Nonexudative age-related macular degeneration, bilateral, intermediate dry stage: Secondary | ICD-10-CM | POA: Diagnosis not present

## 2016-08-19 DIAGNOSIS — H43813 Vitreous degeneration, bilateral: Secondary | ICD-10-CM

## 2016-08-19 DIAGNOSIS — H35033 Hypertensive retinopathy, bilateral: Secondary | ICD-10-CM | POA: Diagnosis not present

## 2016-08-19 DIAGNOSIS — H33301 Unspecified retinal break, right eye: Secondary | ICD-10-CM | POA: Diagnosis not present

## 2016-08-26 ENCOUNTER — Ambulatory Visit: Payer: PPO | Admitting: Internal Medicine

## 2016-11-11 DIAGNOSIS — Z85820 Personal history of malignant melanoma of skin: Secondary | ICD-10-CM | POA: Diagnosis not present

## 2016-11-11 DIAGNOSIS — D485 Neoplasm of uncertain behavior of skin: Secondary | ICD-10-CM | POA: Diagnosis not present

## 2016-11-11 DIAGNOSIS — D1801 Hemangioma of skin and subcutaneous tissue: Secondary | ICD-10-CM | POA: Diagnosis not present

## 2016-11-11 DIAGNOSIS — D225 Melanocytic nevi of trunk: Secondary | ICD-10-CM | POA: Diagnosis not present

## 2016-11-11 DIAGNOSIS — L821 Other seborrheic keratosis: Secondary | ICD-10-CM | POA: Diagnosis not present

## 2016-11-11 DIAGNOSIS — L814 Other melanin hyperpigmentation: Secondary | ICD-10-CM | POA: Diagnosis not present

## 2016-11-16 DIAGNOSIS — M720 Palmar fascial fibromatosis [Dupuytren]: Secondary | ICD-10-CM | POA: Diagnosis not present

## 2016-11-30 DIAGNOSIS — R972 Elevated prostate specific antigen [PSA]: Secondary | ICD-10-CM | POA: Diagnosis not present

## 2016-12-07 DIAGNOSIS — R972 Elevated prostate specific antigen [PSA]: Secondary | ICD-10-CM | POA: Diagnosis not present

## 2016-12-07 DIAGNOSIS — N401 Enlarged prostate with lower urinary tract symptoms: Secondary | ICD-10-CM | POA: Diagnosis not present

## 2016-12-07 DIAGNOSIS — R3915 Urgency of urination: Secondary | ICD-10-CM | POA: Diagnosis not present

## 2016-12-17 DIAGNOSIS — H524 Presbyopia: Secondary | ICD-10-CM | POA: Diagnosis not present

## 2017-01-01 ENCOUNTER — Other Ambulatory Visit: Payer: Self-pay | Admitting: Internal Medicine

## 2017-02-17 DIAGNOSIS — M720 Palmar fascial fibromatosis [Dupuytren]: Secondary | ICD-10-CM | POA: Diagnosis not present

## 2017-02-23 DIAGNOSIS — M720 Palmar fascial fibromatosis [Dupuytren]: Secondary | ICD-10-CM | POA: Diagnosis not present

## 2017-03-15 ENCOUNTER — Other Ambulatory Visit: Payer: Self-pay | Admitting: Internal Medicine

## 2017-03-18 ENCOUNTER — Other Ambulatory Visit: Payer: Self-pay | Admitting: Internal Medicine

## 2017-04-01 ENCOUNTER — Ambulatory Visit: Payer: PPO

## 2017-04-02 ENCOUNTER — Ambulatory Visit (INDEPENDENT_AMBULATORY_CARE_PROVIDER_SITE_OTHER): Payer: PPO | Admitting: *Deleted

## 2017-04-02 VITALS — BP 142/68 | HR 62 | Ht 73.0 in | Wt 236.0 lb

## 2017-04-02 DIAGNOSIS — Z0000 Encounter for general adult medical examination without abnormal findings: Secondary | ICD-10-CM

## 2017-04-02 MED ORDER — LOSARTAN POTASSIUM 100 MG PO TABS: 100.0000 mg | ORAL_TABLET | Freq: Every day | ORAL | 4 refills | Status: DC

## 2017-04-02 MED ORDER — CHOLECALCIFEROL 25 MCG (1000 UT) PO TABS: 1000.0000 [IU] | ORAL_TABLET | Freq: Every day | ORAL | 3 refills | Status: DC

## 2017-04-02 MED ORDER — ATORVASTATIN CALCIUM 40 MG PO TABS: 40.0000 mg | ORAL_TABLET | Freq: Every day | ORAL | 11 refills | Status: DC

## 2017-04-02 NOTE — Progress Notes (Addendum)
Subjective:   Oscar [name] is a 76 y.o. male who presents for Medicare Annual/Subsequent preventive examination.  Review of Systems:  No ROS.  Medicare Wellness Visit. Additional risk factors are reflected in the social history.  Cardiac Risk Factors include: advanced age (>34men, >68 women);dyslipidemia;hypertension Sleep patterns: feels rested on waking, gets up 1 times nightly to void and sleeps 8-9 hours nightly.    Home Safety/Smoke Alarms: Feels safe in home. Smoke alarms in place.  Living environment; residence and Firearm Safety: 2-story house, no firearms. Seat Belt Safety/Bike Helmet: Wears seat belt.      Objective:    Vitals: BP (!) 142/68   Pulse 62   Ht 6\' 1"  (1.854 m)   Wt 236 lb (107 kg)   BMI 31.14 kg/m   Body mass index is 31.14 kg/m.  Tobacco Social History   Tobacco Use  Smoking Status Former Smoker  . Packs/day: 1.50  . Years: 20.00  . Pack years: 30.00  . Types: Cigarettes  . Last attempt to quit: 05/25/1978  . Years since quitting: 38.8  Smokeless Tobacco Never Used     Counseling given: Not Answered   Past Medical History:  Diagnosis Date  . Adenomatous colon polyp   . Allergic rhinitis, cause unspecified   . Allergy   . Anxiety state, unspecified   . Barrett esophagus   . BPH (benign prostatic hypertrophy)    BX 2007  . Cancer ([place])    melanoma  . Cataract    bilaterally removed  . Depressive disorder, not elsewhere classified   . GERD (gastroesophageal reflux disease)   . Hypogonadism male   . Microhematuria   . Neuromuscular disorder ([place])    small Porter 05-26-2012 egd   . Other and unspecified hyperlipidemia   . Rosacea   . UTI (lower urinary tract infection) 2009   Past Surgical History:  Procedure Laterality Date  . CATARACT EXTRACTION  05/10/2012   left  . COLONOSCOPY  04-22-2010  . ESOPHAGOGASTRODUODENOSCOPY  05-26-2012   stark  . HAND SURGERY Left   . MELANOMA EXCISION    . POLYPECTOMY     prev colon TA polyps- no  polyps in 2011  . PROSTATE BIOPSY  2011   Dr Winifred Olive    Family History  Problem Relation Age of Onset  . Hypertension Mother   . Cancer Mother        colon ca  . Colon cancer Mother 99  . Heart disease Father        MI  . Coronary artery disease Other   . Stomach cancer Maternal Grandfather   . Colon polyps Neg Hx   . Esophageal cancer Neg Hx   . Rectal cancer Neg Hx    Social History   Substance and Sexual Activity  Sexual Activity Yes    Outpatient Encounter Medications as of 04/02/2017  Medication Sig  . ARIPiprazole (ABILIFY) 10 MG tablet Take 10 mg by mouth daily.    Marland Kitchen aspirin 81 MG EC tablet Take 325 mg by mouth daily.   Marland Kitchen atorvastatin (LIPITOR) 40 MG tablet Take 1 tablet (40 mg total) daily by mouth.  . Cholecalciferol (RA VITAMIN D-3) 1000 units tablet Take 1 tablet (1,000 Units total) daily by mouth.  . desvenlafaxine (PRISTIQ) 50 MG 24 hr tablet Take 50 mg by mouth daily.  Marland Kitchen losartan (COZAAR) 100 MG tablet Take 1 tablet (100 mg total) daily by mouth.  . multivitamin-lutein (OCUVITE-LUTEIN) CAPS capsule Take 1 capsule daily by  mouth.  . omeprazole (PRILOSEC) 20 MG capsule Take 40 mg by mouth daily.    . sertraline (ZOLOFT) 100 MG tablet Take 100 mg by mouth daily.   . tamsulosin (FLOMAX) 0.4 MG CAPS capsule Take 0.4 mg by mouth daily.  . [DISCONTINUED] atorvastatin (LIPITOR) 40 MG tablet Take 1 tablet (40 mg total) by mouth daily.  . [DISCONTINUED] losartan (COZAAR) 100 MG tablet Take 1 tablet (100 mg total) by mouth daily.  . [DISCONTINUED] RA VITAMIN D-3 1000 units tablet take 1 tablet by mouth once daily   No facility-administered encounter medications on file as of 04/02/2017.     Activities of Daily Living In your present state of health, do you have any difficulty performing the following activities: 04/02/2017  Hearing? N  Vision? N  Difficulty concentrating or making decisions? N  Walking or climbing stairs? N  Dressing or bathing? N  Doing errands,  shopping? N  Preparing Food and eating ? N  Using the Toilet? N  In the past six months, have you accidently leaked urine? N  Do you have problems with loss of bowel control? N  Managing your Medications? N  Managing your Finances? N  Housekeeping or managing your Housekeeping? N  Some recent data might be hidden    Patient Care Team: Plotnikov, Evie Lacks, MD as PCP - General Chucky May, MD (Psychiatry) Festus Aloe, MD as Attending Physician (Urology) Tanda Rockers, MD as Attending Physician (Pulmonary Disease) Ladene Artist, MD as Attending Physician (Gastroenterology)   Assessment:    Physical assessment deferred to PCP.  Exercise Activities and Dietary recommendations Current Exercise Habits: The patient does not participate in regular exercise at present, Exercise limited by: orthopedic condition(s)   Diet (meal preparation, eat out, water intake, caffeinated beverages, dairy products, fruits and vegetables): in general, a "healthy" diet  , well balanced   Reviewed heart healthy diet, encouraged patient to increase daily water intake. Discussed weight loss tips and monitoring carbohydrates. Diet education was attached to patient's AVS.   Goals      Patient Stated   . to remain healthy (pt-stated)     [facility]; continue to engage in social activities; Take care of health ongoing      Fall Risk Fall Risk  04/02/2017 02/26/2016 01/09/2015 08/20/2014  Falls in the past year? No No Yes No  Comment - - wasn't paying attention; stripped down steps  in front house -  Number falls in past yr: - - 1 -  Injury with Fall? - - No -   Depression Screen PHQ 2/9 Scores 04/02/2017 02/26/2016 01/09/2015 08/20/2014  PHQ - 2 Score 0 0 0 0  PHQ- 9 Score 0 - - -    Cognitive Function MMSE - Mini Mental State Exam 04/02/2017  Orientation to time 5  Orientation to Place 5  Registration 3  Attention/ Calculation 5  Recall 3  Language- name 2 objects 2    Language- repeat 1  Language- follow 3 step command 3  Language- read & follow direction 1  Write a sentence 1  Copy design 1  Total score 30        Immunization History  Administered Date(s) Administered  . Influenza Split 04/09/2011, 03/01/2012  . Influenza Whole 03/19/2009  . Influenza, High Dose Seasonal PF 02/26/2016  . Influenza,inj,Quad PF,6+ Mos 03/16/2013, 02/15/2014, 02/20/2015  . Influenza-Unspecified 03/15/2017  . Pneumococcal Conjugate-13 08/15/2013  . Pneumococcal Polysaccharide-23 07/22/2009  . Td 08/20/2014  .  Tdap 08/12/2016  . Zoster 02/20/2015   Screening Tests Health Maintenance  Topic Date Due  . COLONOSCOPY  06/25/2018  . TETANUS/TDAP  08/13/2026  . INFLUENZA VACCINE  Completed  . PNA vac Low Risk Adult  Completed      Plan:    Continue doing brain stimulating activities (puzzles, reading, adult coloring books, staying active) to keep memory sharp.   Continue to eat heart healthy diet (full of fruits, vegetables, whole grains, lean protein, water--limit salt, fat, and sugar intake) and increase physical activity as tolerated.   I have personally reviewed and noted the following in the patient's chart:   . Medical and social history . Use of alcohol, tobacco or illicit drugs  . Current medications and supplements . Functional ability and status . Nutritional status . Physical activity . Advanced directives . List of other physicians . Vitals . Screenings to include cognitive, depression, and falls . Referrals and appointments  In addition, I have reviewed and discussed with patient certain preventive protocols, quality metrics, and best practice recommendations. A written personalized care plan for preventive services as well as general preventive health recommendations were provided to patient.     Michiel Cowboy, RN  04/02/2017  Medical screening examination/treatment/procedure(s) were performed by non-physician practitioner and as  supervising physician I was immediately available for consultation/collaboration. I agree with above. Lew Dawes, MD

## 2017-04-02 NOTE — Patient Instructions (Signed)
Continue doing brain stimulating activities (puzzles, reading, adult coloring books, staying active) to keep memory sharp.   Continue to eat heart healthy diet (full of fruits, vegetables, whole grains, lean protein, water--limit salt, fat, and sugar intake) and increase physical activity as tolerated.  Oscar [name] , Thank you for taking time to come for your Medicare Wellness Visit. I appreciate your ongoing commitment to your health goals. Please review the following plan we discussed and let me know if I can assist you in the future.   These are the goals we discussed: I want to increase my physical activity, perhaps yoga, continue to eat healthy, monitor carbohydrates.    Protein Content in Foods  Generally, most healthy people need around 50 grams of protein each day. Depending on your overall health, you may need more or less protein in your diet. Talk to your health care provider or dietitian about how much protein you need. See the following list for the protein content of some common foods. High-protein foods High-protein foods contain 4 grams (4 g) or more of protein per serving. They include:  Beef, ground sirloin (cooked) - 3 oz have 24 g of protein.  Cheese (hard) - 1 oz has 7 g of protein.  Chicken breast, boneless and skinless (cooked) - 3 oz have 13.4 g of protein.  Cottage cheese - 1/2 cup has 13.4 g of protein.  Egg - 1 egg has 6 g of protein.  Fish, filet (cooked) - 1 oz has 6-7 g of protein.  Garbanzo beans (canned or cooked) - 1/2 cup has 6-7 g of protein.  Kidney beans (canned or cooked) - 1/2 cup has 6-7 g of protein.  Lamb (cooked) - 3 oz has 24 g of protein.  Milk - 1 cup (8 oz) has 8 g of protein.  Nuts (peanuts, pistachios, almonds) - 1 oz has 6 g of protein.  Peanut butter - 1 oz has 7-8 g of protein.  Pork tenderloin (cooked) - 3 oz has 18.4 g of protein.  Pumpkin seeds - 1 oz has 8.5 g of protein.  Soybeans (roasted) - 1 oz has 8 g of  protein.  Soybeans (cooked) - 1/2 cup has 11 g of protein.  Soy milk - 1 cup (8 oz) has 5-10 g of protein.  Soy or vegetable patty - 1 patty has 11 g of protein.  Sunflower seeds - 1 oz has 5.5 g of protein.  Tofu (firm) - 1/2 cup has 20 g of protein.  Tuna (canned in water) - 3 oz has 20 g of protein.  Yogurt - 6 oz has 8 g of protein.  Low-protein foods Low-protein foods contain 3 grams (3 g) or less of protein per serving. They include:  Beets (raw or cooked) - 1/2 cup has 1.5 g of protein.  Bran cereal - 1/2 cup has 2-3 g of protein.  Bread - 1 slice has 2.5 g of protein.  Broccoli (raw or cooked) - 1/2 cup has 2 g of protein.  Collard greens (raw or cooked) - 1/2 cup has 2 g of protein.  Corn (fresh or cooked) - 1/2 cup has 2 g of protein.  Cream cheese - 1 oz has 2 g of protein.  Creamer (half-and-half) - 1 oz has 1 g of protein.  Flour tortilla - 1 tortilla has 2.5 g of protein  Frozen yogurt - 1/2 cup has 3 g of protein.  Fruit or vegetable juice - 1/2 cup has 1 g  of protein.  Green beans (raw or cooked) - 1/2 cup has 1 g of protein.  Green peas (canned) - 1/2 cup has 3.5 g of protein.  Muffins - 1 small muffin (2 oz) has 3 g of protein.  Oatmeal (cooked) - 1/2 cup has 3 g of protein.  Potato (baked with skin) - 1 medium potato has 3 g of protein.  Rice (cooked) - 1/2 cup has 2.5-3.5 g of protein.  Sour cream - 1/2 cup has 2.5 g of protein.  Spinach (cooked) - 1/2 cup has 3 g of protein.  Squash (cooked) - 1/2 cup has 1.5 g of protein.  Actual amounts of protein may be different depending on processing. Talk with your health care provider or dietitian about what foods are recommended for you. This information is not intended to replace advice given to you by your health care provider. Make sure you discuss any questions you have with your health care provider. Document Released: 08/10/2015 Document Revised: 01/20/2016 Document Reviewed:  01/20/2016 Elsevier Interactive Patient Education  2018 [place]      Patient Stated   . to remain healthy (pt-stated)     [facility]; continue to engage in social activities; Take care of health ongoing       This is a list of the screening recommended for you and due dates:  Health Maintenance  Topic Date Due  . Colon Cancer Screening  06/25/2018  . Tetanus Vaccine  08/13/2026  . Flu Shot  Completed  . Pneumonia vaccines  Completed

## 2017-04-03 ENCOUNTER — Encounter: Payer: Self-pay | Admitting: Internal Medicine

## 2017-04-05 ENCOUNTER — Other Ambulatory Visit: Payer: Self-pay | Admitting: Internal Medicine

## 2017-04-30 DIAGNOSIS — J069 Acute upper respiratory infection, unspecified: Secondary | ICD-10-CM | POA: Diagnosis not present

## 2017-04-30 DIAGNOSIS — J018 Other acute sinusitis: Secondary | ICD-10-CM | POA: Diagnosis not present

## 2017-04-30 DIAGNOSIS — R05 Cough: Secondary | ICD-10-CM | POA: Diagnosis not present

## 2017-04-30 DIAGNOSIS — H6123 Impacted cerumen, bilateral: Secondary | ICD-10-CM | POA: Diagnosis not present

## 2017-05-06 DIAGNOSIS — H6123 Impacted cerumen, bilateral: Secondary | ICD-10-CM | POA: Diagnosis not present

## 2017-05-06 DIAGNOSIS — E669 Obesity, unspecified: Secondary | ICD-10-CM | POA: Diagnosis not present

## 2017-05-06 DIAGNOSIS — J018 Other acute sinusitis: Secondary | ICD-10-CM | POA: Diagnosis not present

## 2017-05-06 DIAGNOSIS — K2270 Barrett's esophagus without dysplasia: Secondary | ICD-10-CM | POA: Diagnosis not present

## 2017-06-03 ENCOUNTER — Ambulatory Visit: Payer: PPO | Admitting: Internal Medicine

## 2017-06-04 ENCOUNTER — Telehealth (HOSPITAL_COMMUNITY): Payer: Self-pay | Admitting: Internal Medicine

## 2017-06-04 ENCOUNTER — Ambulatory Visit (INDEPENDENT_AMBULATORY_CARE_PROVIDER_SITE_OTHER): Payer: PPO | Admitting: Internal Medicine

## 2017-06-04 ENCOUNTER — Other Ambulatory Visit (INDEPENDENT_AMBULATORY_CARE_PROVIDER_SITE_OTHER): Payer: PPO

## 2017-06-04 ENCOUNTER — Encounter: Payer: Self-pay | Admitting: Internal Medicine

## 2017-06-04 DIAGNOSIS — R0609 Other forms of dyspnea: Secondary | ICD-10-CM

## 2017-06-04 DIAGNOSIS — Z0000 Encounter for general adult medical examination without abnormal findings: Secondary | ICD-10-CM

## 2017-06-04 LAB — HEPATIC FUNCTION PANEL
ALBUMIN: 4.3 g/dL (ref 3.5–5.2)
ALT: 23 U/L (ref 0–53)
AST: 19 U/L (ref 0–37)
Alkaline Phosphatase: 71 U/L (ref 39–117)
Bilirubin, Direct: 0.1 mg/dL (ref 0.0–0.3)
Total Bilirubin: 0.6 mg/dL (ref 0.2–1.2)
Total Protein: 7.1 g/dL (ref 6.0–8.3)

## 2017-06-04 LAB — BASIC METABOLIC PANEL
BUN: 10 mg/dL (ref 6–23)
CALCIUM: 9 mg/dL (ref 8.4–10.5)
CO2: 26 mEq/L (ref 19–32)
Chloride: 102 mEq/L (ref 96–112)
Creatinine, Ser: 0.77 mg/dL (ref 0.40–1.50)
GFR: 104.47 mL/min (ref >60.00)
GLUCOSE: 96 mg/dL (ref 70–99)
Potassium: 4.2 mEq/L (ref 3.5–5.1)
SODIUM: 138 meq/L (ref 135–145)

## 2017-06-04 LAB — CBC WITH DIFFERENTIAL/PLATELET
BASOS PCT: 0.7 % (ref 0.0–3.0)
Basophils Absolute: 0.1 10*3/uL (ref 0.0–0.1)
EOS PCT: 2 % (ref 0.0–5.0)
Eosinophils Absolute: 0.2 10*3/uL (ref 0.0–0.7)
HCT: 42.9 % (ref 39.0–52.0)
Hemoglobin: 14.2 g/dL (ref 13.0–17.0)
Lymphocytes Relative: 25.3 % (ref 12.0–46.0)
Lymphs Abs: 3.1 10*3/uL (ref 0.7–4.0)
MCHC: 33.1 g/dL (ref 30.0–36.0)
MCV: 90.5 fl (ref 78.0–100.0)
Monocytes Absolute: 1 10*3/uL (ref 0.1–1.0)
Monocytes Relative: 7.8 % (ref 3.0–12.0)
NEUTROS ABS: 7.8 10*3/uL — AB (ref 1.4–7.7)
Neutrophils Relative %: 64.2 % (ref 43.0–77.0)
PLATELETS: 294 10*3/uL (ref 150.0–400.0)
RBC: 4.74 Mil/uL (ref 4.22–5.81)
RDW: 14.4 % (ref 11.5–15.5)
WBC: 12.2 10*3/uL — ABNORMAL HIGH (ref 4.0–10.5)

## 2017-06-04 LAB — LIPID PANEL
CHOLESTEROL: 173 mg/dL (ref 0–200)
HDL: 59.4 mg/dL (ref >39.00)
NonHDL: 114.01
TRIGLYCERIDES: 224 mg/dL — AB (ref 0.0–149.0)
Total CHOL/HDL Ratio: 3
VLDL: 44.8 mg/dL — AB (ref 0.0–40.0)

## 2017-06-04 LAB — URINALYSIS
Bilirubin Urine: NEGATIVE
Hgb urine dipstick: NEGATIVE
KETONES UR: NEGATIVE
Leukocytes, UA: NEGATIVE
Nitrite: NEGATIVE
Specific Gravity, Urine: <=1.005 — AB (ref 1.000–1.030)
TOTAL PROTEIN, URINE-UPE24: NEGATIVE
URINE GLUCOSE: NEGATIVE
UROBILINOGEN UA: 0.2 (ref 0.0–1.0)
pH: 6 (ref 5.0–8.0)

## 2017-06-04 LAB — TSH: TSH: 2.02 u[IU]/mL (ref 0.35–4.50)

## 2017-06-04 LAB — LDL CHOLESTEROL, DIRECT: Direct LDL: 99 mg/dL

## 2017-06-04 MED ORDER — UMECLIDINIUM-VILANTEROL 62.5-25 MCG/INH IN AEPB: 1.0000 | INHALATION_SPRAY | Freq: Every day | RESPIRATORY_TRACT | 11 refills | Status: DC

## 2017-06-04 NOTE — Patient Instructions (Signed)

## 2017-06-04 NOTE — Assessment & Plan Note (Addendum)
EKG Pulm ref Dr Eveline Keto Stress test

## 2017-06-04 NOTE — Progress Notes (Signed)
Subjective:  Patient ID: Oscar [name], male    DOB: April 15, 1941  Age: 77 y.o. MRN: [id number]  CC: No chief complaint on file.   HPI Oscar [name] presents for a well exam C/o SOB episodes - worse after walking a dog, bending over  Outpatient Medications Prior to Visit  Medication Sig Dispense Refill  . ARIPiprazole (ABILIFY) 10 MG tablet Take 10 mg by mouth daily.      Marland Kitchen aspirin 81 MG EC tablet Take 325 mg by mouth daily.     Marland Kitchen atorvastatin (LIPITOR) 40 MG tablet Take 1 tablet (40 mg total) daily by mouth. 90 tablet 0  . Cholecalciferol (RA VITAMIN D-3) 1000 units tablet Take 1 tablet (1,000 Units total) daily by mouth. 90 tablet 3  . desvenlafaxine (PRISTIQ) 50 MG 24 hr tablet Take 50 mg by mouth daily.    Marland Kitchen losartan (COZAAR) 100 MG tablet Take 1 tablet (100 mg total) daily by mouth. 90 tablet 4  . multivitamin-lutein (OCUVITE-LUTEIN) CAPS capsule Take 1 capsule daily by mouth.    Marland Kitchen omeprazole (PRILOSEC) 20 MG capsule Take 40 mg by mouth daily.      . sertraline (ZOLOFT) 100 MG tablet Take 100 mg by mouth daily.     . tamsulosin (FLOMAX) 0.4 MG CAPS capsule Take 0.4 mg by mouth daily.     No facility-administered medications prior to visit.     ROS Review of Systems  Constitutional: Negative for appetite change, fatigue and unexpected weight change.  HENT: Negative for congestion, nosebleeds, sneezing, sore throat and trouble swallowing.   Eyes: Negative for itching and visual disturbance.  Respiratory: Positive for shortness of breath. Negative for cough and wheezing.   Cardiovascular: Negative for chest pain, palpitations and leg swelling.  Gastrointestinal: Negative for abdominal distention, blood in stool, diarrhea and nausea.  Genitourinary: Negative for frequency and hematuria.  Musculoskeletal: Negative for back pain, gait problem, joint swelling and neck pain.  Skin: Negative for rash.  Neurological: Negative for dizziness, tremors, speech difficulty and weakness.    Psychiatric/Behavioral: Negative for agitation, dysphoric mood and sleep disturbance. The patient is not nervous/anxious.     Objective:  BP 132/78 (BP Location: Left Arm, Patient Position: Sitting, Cuff Size: Large)   Pulse 61   Temp 97.9 F (36.6 C) (Oral)   Ht 6\' 1"  (1.854 m)   Wt 235 lb (106.6 kg)   SpO2 99%   BMI 31.00 kg/m   BP Readings from Last 3 Encounters:  06/04/17 132/78  04/02/17 (!) 142/68  02/26/16 130/68    Wt Readings from Last 3 Encounters:  06/04/17 235 lb (106.6 kg)  04/02/17 236 lb (107 kg)  02/26/16 229 lb (103.9 kg)    Physical Exam  Constitutional: He is oriented to person, place, and time. He appears well-developed. No distress.  NAD  HENT:  Mouth/Throat: Oropharynx is clear and moist.  Eyes: Conjunctivae are normal. Pupils are equal, round, and reactive to light.  Neck: Normal range of motion. No JVD present. No thyromegaly present.  Cardiovascular: Normal rate, regular rhythm, normal heart sounds and intact distal pulses. Exam reveals no gallop and no friction rub.  No murmur heard. Pulmonary/Chest: Effort normal and breath sounds normal. No respiratory distress. He has no wheezes. He has no rales. He exhibits no tenderness.  Abdominal: Soft. Bowel sounds are normal. He exhibits no distension and no mass. There is no tenderness. There is no rebound and no guarding.  Musculoskeletal: Normal range of motion. He exhibits no  edema or tenderness.  Lymphadenopathy:    He has no cervical adenopathy.  Neurological: He is alert and oriented to person, place, and time. He has normal reflexes. No cranial nerve deficit. He exhibits normal muscle tone. He displays a negative Romberg sign. Coordination and gait normal.  Skin: Skin is warm and dry. No rash noted.  Psychiatric: He has a normal mood and affect. His behavior is normal. Judgment and thought content normal.   Procedure: EKG Indication: SOB Impression: NSR. No acute changes.  I personally  provided Anoro nhaler use teaching. After the teaching patient was able to demonstrate it's use effectively. All questions were answered  Lab Results  Component Value Date   WBC 8.8 02/26/2016   HGB 14.0 02/26/2016   HCT 41.2 02/26/2016   PLT 245.0 02/26/2016   GLUCOSE 92 02/26/2016   CHOL 176 02/26/2016   TRIG 163.0 (H) 02/26/2016   HDL 65.10 02/26/2016   LDLDIRECT 107.8 04/12/2013   LDLCALC 78 02/26/2016   ALT 27 02/26/2016   AST 23 02/26/2016   NA 140 02/26/2016   K 4.4 02/26/2016   CL 105 02/26/2016   CREATININE 0.88 02/26/2016   BUN 11 02/26/2016   CO2 25 02/26/2016   TSH 1.61 02/26/2016   PSA 4.29 (H) 02/26/2016   HGBA1C 5.6 03/22/2014    Dg Chest 2 View  Result Date: 02/26/2016 CLINICAL DATA:  Dyspnea on exertion for the past year. History of hypertension. EXAM: CHEST  2 VIEW COMPARISON:  02/16/2012 FINDINGS: Normal cardiac silhouette and mediastinal contours with atherosclerotic plaque within the thoracic aorta. The lungs appear mildly hyperexpanded with flattening the bilaterally diaphragms. Minimal left basilar linear heterogeneous opacities favored to represent atelectasis or scar. No new focal airspace opacities. No pleural effusion or pneumothorax. No evidence of edema. No acute osseous abnormalities. IMPRESSION: 1. Minimal left basilar atelectasis / scar without acute cardiopulmonary disease. 2. Mild lung hyperexpansion.  Emphysema. (ICD10-J43.9) 3.  Aortic Atherosclerosis (ICD10-170.0) Electronically Signed   By: Sandi Mariscal M.D.   On: 02/26/2016 09:35    Assessment & Plan:   There are no diagnoses linked to this encounter. I am having Oscar [name] maintain his ARIPiprazole, aspirin, omeprazole, sertraline, desvenlafaxine, tamsulosin, multivitamin-lutein, losartan, Cholecalciferol, and atorvastatin.  No orders of the defined types were placed in this encounter.    Follow-up: No Follow-up on file.  Walker Kehr, MD

## 2017-06-04 NOTE — Assessment & Plan Note (Signed)

## 2017-06-05 ENCOUNTER — Other Ambulatory Visit: Payer: Self-pay | Admitting: Internal Medicine

## 2017-06-05 DIAGNOSIS — D72829 Elevated white blood cell count, unspecified: Secondary | ICD-10-CM

## 2017-06-05 DIAGNOSIS — E785 Hyperlipidemia, unspecified: Secondary | ICD-10-CM

## 2017-06-07 NOTE — Telephone Encounter (Signed)
User: [name] A Date/time: 06/04/17 10:22 AM  Comment: Called pt and lmsg for him to CB to sch myoview.   Context:  Outcome: Left Message  Phone number: [phone] Phone Type: Mobile  Comm. type: Telephone Call type: Outgoing  Contact: [name], Oscar Relation to patient: Self

## 2017-06-09 ENCOUNTER — Telehealth (HOSPITAL_COMMUNITY): Payer: Self-pay | Admitting: *Deleted

## 2017-06-09 NOTE — Telephone Encounter (Signed)
Patient given detailed instructions per Myocardial Perfusion Study Information Sheet for the test on 06/14/17 Patient notified to arrive 15 minutes early and that it is imperative to arrive on time for appointment to keep from having the test rescheduled.  If you need to cancel or reschedule your appointment, please call the office within 24 hours of your appointment. . Patient verbalized understanding. Tarius Stangelo Jacqueline    

## 2017-06-10 DIAGNOSIS — N401 Enlarged prostate with lower urinary tract symptoms: Secondary | ICD-10-CM | POA: Diagnosis not present

## 2017-06-14 ENCOUNTER — Ambulatory Visit (HOSPITAL_COMMUNITY): Payer: PPO | Attending: Internal Medicine

## 2017-06-14 DIAGNOSIS — R0609 Other forms of dyspnea: Secondary | ICD-10-CM

## 2017-06-14 LAB — MYOCARDIAL PERFUSION IMAGING
CHL CUP MPHR: 145 {beats}/min
CHL CUP NUCLEAR SDS: 3
CHL CUP RESTING HR STRESS: 56 {beats}/min
CHL RATE OF PERCEIVED EXERTION: 19
CSEPEDS: 50 s
CSEPHR: 85 %
CSEPPHR: 123 {beats}/min
Estimated workload: 7 METS
Exercise duration (min): 5 min
LV sys vol: 51 mL
LVDIAVOL: 119 mL (ref 62–150)
RATE: 0.42
SRS: 3
SSS: 6
TID: 0.9

## 2017-06-14 MED ORDER — TECHNETIUM TC 99M TETROFOSMIN IV KIT
32.5000 | PACK | Freq: Once | INTRAVENOUS | Status: AC | PRN
  Administered 2017-06-14: 32.5 via INTRAVENOUS
  Filled 2017-06-14: qty 33

## 2017-06-14 MED ORDER — TECHNETIUM TC 99M TETROFOSMIN IV KIT
10.6000 | PACK | Freq: Once | INTRAVENOUS | Status: AC | PRN
  Administered 2017-06-14: 10.6 via INTRAVENOUS
  Filled 2017-06-14: qty 11

## 2017-06-15 ENCOUNTER — Encounter: Payer: Self-pay | Admitting: Internal Medicine

## 2017-06-16 DIAGNOSIS — R3915 Urgency of urination: Secondary | ICD-10-CM | POA: Diagnosis not present

## 2017-06-16 DIAGNOSIS — N401 Enlarged prostate with lower urinary tract symptoms: Secondary | ICD-10-CM | POA: Diagnosis not present

## 2017-06-16 DIAGNOSIS — R972 Elevated prostate specific antigen [PSA]: Secondary | ICD-10-CM | POA: Diagnosis not present

## 2017-07-09 ENCOUNTER — Ambulatory Visit: Payer: PPO | Admitting: Internal Medicine

## 2017-07-09 ENCOUNTER — Encounter: Payer: Self-pay | Admitting: Internal Medicine

## 2017-07-09 VITALS — BP 124/68 | HR 60 | Ht 73.0 in | Wt 233.0 lb

## 2017-07-09 DIAGNOSIS — K22719 Barrett's esophagus with dysplasia, unspecified: Secondary | ICD-10-CM

## 2017-07-09 DIAGNOSIS — R0609 Other forms of dyspnea: Secondary | ICD-10-CM | POA: Diagnosis not present

## 2017-07-09 NOTE — Progress Notes (Signed)
Subjective:    Patient ID: Oscar [name], male    DOB: Dec 09, 1940   MRN: [id number]     Brief patient profile:  45 yowm quit smoking 1980 with h/o allergies x decades spring >  fall itching sneezing runny nose referred 03/01/2012 to pulmonary clinic by Plotnikov for sob.    History of Present Illness  03/01/2012 1st pulmonary eval/ [name] cc acute onset sob immediately whenever bends over sev years ago but then about a year and half prior to [place] progressive decline in ex tol like when helping son move furniture, now gasping for air anytime ties shoes.  Able to walk a half a mile including some hills with minimal doe if not bending over. Takes prilosec before bfast daily. Also fatigue, rarely sore throat assoc with spring and fall weather changes.  rec GERD (REFLUX)  diet Walking 30 min daily where are continually short of breath but never out of breath Please schedule a follow up office visit in 4 weeks, sooner if needed with pft's   04/04/2012 f/u ov/[name] cc breathing some better since the last visit.  rec Keep walking and work on wt loss   07/09/2017    ov/[name] re: re-consult re sob p gained another 13 lb s ever losing any from last eval  Chief Complaint  Patient presents with  . Pulmonary Consult    Referred back by Dr. Alain Marion. Pt c/o increased SOB over the past several years. He states he used to get SOB only with bending over, but now he can get SOB "out of no where" even just at rest sometimes.   Dyspnea:  Finds ex boring / thinks may start golf / joined y but not using  Cough: no Sleep: very well s resp complaints paroxyms of sob last a few breaths to one min freq= 2-3 x per week  X years Does fine walking dog flat  Sob bending over to tie shoes  Has HH > barretts On ppi x 40 mg 3--45 min ac each am    No obvious day to day or daytime variability or assoc excess/ purulent sputum or mucus plugs or hemoptysis or cp or chest tightness, subjective wheeze or overt  sinus or hb symptoms. No unusual exposure hx or h/o childhood pna/ asthma or knowledge of premature birth.  Sleeping ok flat without nocturnal  or early am exacerbation  of respiratory  c/o's or need for noct saba. Also denies any obvious fluctuation of symptoms with weather or environmental changes or other aggravating or alleviating factors except as outlined above   Current Allergies, Complete Past Medical History, Past Surgical History, Family History, and Social History were reviewed in [facility] record.  ROS  The following are not active complaints unless bolded Hoarseness, sore throat, dysphagia, dental problems, itching, sneezing,  nasal congestion or discharge of excess mucus or purulent secretions, ear ache,   fever, chills, sweats, unintended wt loss or wt gain, classically pleuritic or exertional cp,  orthopnea pnd or leg swelling, presyncope, palpitations, abdominal pain, anorexia, nausea, vomiting, diarrhea  or change in bowel habits or change in bladder habits, change in stools or change in urine, dysuria, hematuria,  rash, arthralgias, visual complaints, headache, numbness, weakness or ataxia or problems with walking or coordination,  change in mood/affect or memory.        Current Meds  Medication Sig  . ARIPiprazole (ABILIFY) 10 MG tablet Take 10 mg by mouth daily.    Marland Kitchen aspirin  325 MG tablet Take 325 mg by mouth daily.  Marland Kitchen atorvastatin (LIPITOR) 40 MG tablet Take 1 tablet (40 mg total) daily by mouth.  . Cholecalciferol (RA VITAMIN D-3) 1000 units tablet Take 1 tablet (1,000 Units total) daily by mouth.  . desvenlafaxine (PRISTIQ) 50 MG 24 hr tablet Take 50 mg by mouth daily.  Marland Kitchen losartan (COZAAR) 100 MG tablet Take 1 tablet (100 mg total) daily by mouth.  . multivitamin-lutein (OCUVITE-LUTEIN) CAPS capsule Take 1 capsule daily by mouth.  Marland Kitchen omeprazole (PRILOSEC) 20 MG capsule Take 40 mg by mouth daily.  . sertraline (ZOLOFT) 100 MG tablet Take 100 mg by  mouth daily.   . tamsulosin (FLOMAX) 0.4 MG CAPS capsule Take 0.4 mg by mouth daily.                     Objective:   Physical Exam  Obese pleasant amb anxious wm nad   07/09/2017   233  04/04/2012 220     03/01/12 216 lb (97.977 kg)  02/16/12 218 lb 8 oz (99.111 kg)  09/30/11 211 lb (95.709 kg)    Vital signs reviewed - Note on arrival 02 sats  97% on RA        HEENT: nl dentition, turbinates bilaterally, and oropharynx. Nl external ear canals without cough reflex   NECK :  without JVD/Nodes/TM/ nl carotid upstrokes bilaterally   LUNGS: no acc muscle use,  Nl contour chest which is clear to A and P bilaterally without cough on insp or exp maneuvers   CV:  RRR  no s3 or murmur or increase in P2, and no edema   ABD:  soft and nontender with nl inspiratory excursion in the supine position. No bruits or organomegaly appreciated, bowel sounds nl  MS:  Nl gait/ ext warm without deformities, calf tenderness, cyanosis or clubbing No obvious joint restrictions   SKIN: warm and dry without lesions    NEURO:  alert, approp, nl sensorium with  no motor or cerebellar deficits apparent.      Assessment & Plan:

## 2017-07-09 NOTE — Patient Instructions (Signed)
Weight control is simply a matter of calorie balance which needs to be tilted in your favor by eating less and exercising more.  To get the most out of exercise, you need to be continuously aware that you are short of breath, but never out of breath, for 30 minutes daily. As you improve, it will actually be easier for you to do the same amount of exercise  in  30 minutes so always push to the level where you are short of breath.  If this does not result in gradual weight reduction then I strongly recommend you see a nutritionist with a food diary x 2 weeks so that we can work out a negative calorie balance which is universally effective in steady weight loss programs.  Think of your calorie balance like you do your bank account where in this case you want the balance to go down so you must take in less calories than you burn up.  It's just that simple:  Hard to do, but easy to understand.  Good luck!    GERD (REFLUX)  is an extremely common cause of respiratory symptoms just like yours , many times with no obvious heartburn at all.    It can be treated with medication, but also with lifestyle changes including elevation of the head of your bed (ideally with 6 inch  bed blocks),  Smoking cessation, avoidance of late meals, excessive alcohol, and avoid fatty foods, chocolate, peppermint, colas, red wine, and acidic juices such as orange juice.  NO MINT OR MENTHOL PRODUCTS SO NO COUGH DROPS  USE SUGARLESS CANDY INSTEAD (Jolley ranchers or Stover's or Life Savers) or even ice chips will also do - the key is to swallow to prevent all throat clearing. NO OIL BASED VITAMINS - use powdered substitutes.        Please schedule a follow up office visit in 6 weeks, call sooner if needed with PFTs on return

## 2017-07-10 ENCOUNTER — Encounter: Payer: Self-pay | Admitting: Internal Medicine

## 2017-07-10 NOTE — Assessment & Plan Note (Addendum)
EGD 06/26/15 :  Pos for Barrett's and medium HH   Reviewed diet/ importance of wt loss as he ages to control the reflux component of "acid reflux"

## 2017-07-10 NOTE — Assessment & Plan Note (Addendum)
-   PFT's 04/04/2012 FEV1  3.06 (98%) ratio 61 and no better p B2,  DLCO 114  ERV 16% - Myoview stress test: 05/14/18 neg for significant ischemia The patient exercised following the Bruce protocol.  The patient reported chest pain and shortness of breath during the stress test.  The test was stopped because the patient complained of fatigue, atypical chest pain and shortness of breath.   Heart rate demonstrated a normal response to exercise. Blood pressure demonstrated a hypertensive response to exercise. Overall, the patient's exercise capacity was mildly impaired.    Paroxysms of sob at rest with doe only at highest levels of the Bruce protocol = Symptoms are markedly disproportionate to objective findings and not clear this is actually much of a  lung problem but pt does appear to have difficult to sort out respiratory symptoms of unknown origin for which  DDX  = almost all start with A and  include Adherence, Ace Inhibitors, Acid Reflux, Active Sinus Disease, Alpha 1 Antitripsin deficiency, Anxiety masquerading as Airways dz,  ABPA,  Allergy(esp in young), Aspiration (esp in elderly), Adverse effects of meds,  Active smokers, A bunch of PE's (a small clot burden can't cause this syndrome unless there is already severe underlying pulm or vascular dz with poor reserve) plus two Bs  = Bronchiectasis and Beta blocker use..and one C= CHF     Adherence is always the initial "prime suspect" and is a multilayered concern that requires a "trust but verify" approach in every patient - starting with knowing how to use medications, especially inhalers, correctly, keeping up with refills and understanding the fundamental difference between maintenance and prns vs those medications only taken for a very short course and then stopped and not refilled.   ? Acid (or non-acid) GERD > always difficult to exclude as up to 75% of pts in some series report no assoc GI/ Heartburn symptoms and we know he has GERD (see sep  a/p)> continue max (24h)  acid suppression and diet restrictions/ reviewed and instructions given in writing.   ? Anxiety > usually at the bottom of this list of usual suspects but should be much higher on this pt's based on H and P and note already on psychotropics .   ? Allergy/asthma > very unlikely though note he does probably have very mild copd based on prior pfts = GOLD I criteria then  > return for full pfts then set up for cpst with spirometry before and after   Total time devoted to counseling  > 50 % of  60 min office visit to re-establish :  review case with pt/ discussion of options/alternatives/ personally creating written customized instructions  in presence of pt  then going over those specific  Instructions directly with the pt including how to use all of the meds but in particular covering each new medication in detail and the difference between the maintenance= "automatic" meds and the prns using an action plan format for the latter (If this problem/symptom => do that organization reading Left to right).  Please see AVS from this visit for a full list of these instructions which I personally wrote for this pt and  are unique to this visit.

## 2017-08-09 ENCOUNTER — Ambulatory Visit (INDEPENDENT_AMBULATORY_CARE_PROVIDER_SITE_OTHER): Payer: PPO | Admitting: Internal Medicine

## 2017-08-09 ENCOUNTER — Encounter: Payer: Self-pay | Admitting: Internal Medicine

## 2017-08-09 DIAGNOSIS — K22719 Barrett's esophagus with dysplasia, unspecified: Secondary | ICD-10-CM

## 2017-08-09 DIAGNOSIS — I10 Essential (primary) hypertension: Secondary | ICD-10-CM | POA: Diagnosis not present

## 2017-08-09 DIAGNOSIS — E669 Obesity, unspecified: Secondary | ICD-10-CM | POA: Diagnosis not present

## 2017-08-09 DIAGNOSIS — R0609 Other forms of dyspnea: Secondary | ICD-10-CM

## 2017-08-09 NOTE — Patient Instructions (Signed)
Eliminate apple juice and sweat tea Use herbal fruit tea bags for iced tea Use sweeteners or agave syrup if you have to

## 2017-08-09 NOTE — Assessment & Plan Note (Addendum)
The symptoms are likely related to obesity and deconditioning-he will try to loose wt again PFTs pending CL was OK

## 2017-08-09 NOTE — Assessment & Plan Note (Signed)
Prilosec 

## 2017-08-09 NOTE — Progress Notes (Signed)
Subjective:  Patient ID: Oscar [name], male    DOB: October 30, 1940  Age: 77 y.o. MRN: [id number]  CC: No chief complaint on file.   HPI [name] presents for SOB, GERD, HTN  Outpatient Medications Prior to Visit  Medication Sig Dispense Refill  . ARIPiprazole (ABILIFY) 10 MG tablet Take 10 mg by mouth daily.      Marland Kitchen aspirin 325 MG tablet Take 325 mg by mouth daily.    Marland Kitchen atorvastatin (LIPITOR) 40 MG tablet Take 1 tablet (40 mg total) daily by mouth. 90 tablet 0  . Cholecalciferol (RA VITAMIN D-3) 1000 units tablet Take 1 tablet (1,000 Units total) daily by mouth. 90 tablet 3  . desvenlafaxine (PRISTIQ) 50 MG 24 hr tablet Take 50 mg by mouth daily.    Marland Kitchen losartan (COZAAR) 100 MG tablet Take 1 tablet (100 mg total) daily by mouth. 90 tablet 4  . multivitamin-lutein (OCUVITE-LUTEIN) CAPS capsule Take 1 capsule daily by mouth.    Marland Kitchen omeprazole (PRILOSEC) 20 MG capsule Take 40 mg by mouth daily.    . sertraline (ZOLOFT) 100 MG tablet Take 100 mg by mouth daily.     . tamsulosin (FLOMAX) 0.4 MG CAPS capsule Take 0.4 mg by mouth daily.     No facility-administered medications prior to visit.     ROS Review of Systems  Constitutional: Positive for fatigue. Negative for appetite change and unexpected weight change.  HENT: Negative for congestion, nosebleeds, sneezing, sore throat and trouble swallowing.   Eyes: Negative for itching and visual disturbance.  Respiratory: Positive for shortness of breath. Negative for cough.   Cardiovascular: Negative for chest pain, palpitations and leg swelling.  Gastrointestinal: Negative for abdominal distention, blood in stool, diarrhea and nausea.  Genitourinary: Negative for frequency and hematuria.  Musculoskeletal: Negative for back pain, gait problem, joint swelling and neck pain.  Skin: Negative for rash.  Neurological: Negative for dizziness, tremors, speech difficulty and weakness.  Psychiatric/Behavioral: Negative for agitation, dysphoric mood  and sleep disturbance. The patient is not nervous/anxious.     Objective:  BP 128/78 (BP Location: Left Arm, Patient Position: Sitting, Cuff Size: Large)   Pulse (!) 55   Temp 98.8 F (37.1 C) (Oral)   Ht 6\' 1"  (1.854 m)   Wt 233 lb (105.7 kg)   SpO2 99%   BMI 30.74 kg/m   BP Readings from Last 3 Encounters:  08/09/17 128/78  07/09/17 124/68  06/04/17 132/78    Wt Readings from Last 3 Encounters:  08/09/17 233 lb (105.7 kg)  07/09/17 233 lb (105.7 kg)  06/14/17 235 lb (106.6 kg)    Physical Exam  Constitutional: He is oriented to person, place, and time. He appears well-developed. No distress.  NAD  HENT:  Mouth/Throat: Oropharynx is clear and moist.  Eyes: Pupils are equal, round, and reactive to light. Conjunctivae are normal.  Neck: Normal range of motion. No JVD present. No thyromegaly present.  Cardiovascular: Normal rate, regular rhythm, normal heart sounds and intact distal pulses. Exam reveals no gallop and no friction rub.  No murmur heard. Pulmonary/Chest: Effort normal and breath sounds normal. No respiratory distress. He has no wheezes. He has no rales. He exhibits no tenderness.  Abdominal: Soft. Bowel sounds are normal. He exhibits no distension and no mass. There is no tenderness. There is no rebound and no guarding.  Musculoskeletal: Normal range of motion. He exhibits no edema or tenderness.  Lymphadenopathy:    He has no cervical adenopathy.  Neurological: He  is alert and oriented to person, place, and time. He has normal reflexes. No cranial nerve deficit. He exhibits normal muscle tone. He displays a negative Romberg sign. Coordination and gait normal.  Skin: Skin is warm and dry. No rash noted.  Psychiatric: He has a normal mood and affect. His behavior is normal. Judgment and thought content normal.  Obese  Lab Results  Component Value Date   WBC 12.2 (H) 06/04/2017   HGB 14.2 06/04/2017   HCT 42.9 06/04/2017   PLT 294.0 06/04/2017   GLUCOSE 96  06/04/2017   CHOL 173 06/04/2017   TRIG 224.0 (H) 06/04/2017   HDL 59.40 06/04/2017   LDLDIRECT 99.0 06/04/2017   LDLCALC 78 02/26/2016   ALT 23 06/04/2017   AST 19 06/04/2017   NA 138 06/04/2017   K 4.2 06/04/2017   CL 102 06/04/2017   CREATININE 0.77 06/04/2017   BUN 10 06/04/2017   CO2 26 06/04/2017   TSH 2.02 06/04/2017   PSA 4.29 (H) 02/26/2016   HGBA1C 5.6 03/22/2014    Dg Chest 2 View  Result Date: 02/26/2016 CLINICAL DATA:  Dyspnea on exertion for the past year. History of hypertension. EXAM: CHEST  2 VIEW COMPARISON:  02/16/2012 FINDINGS: Normal cardiac silhouette and mediastinal contours with atherosclerotic plaque within the thoracic aorta. The lungs appear mildly hyperexpanded with flattening the bilaterally diaphragms. Minimal left basilar linear heterogeneous opacities favored to represent atelectasis or scar. No new focal airspace opacities. No pleural effusion or pneumothorax. No evidence of edema. No acute osseous abnormalities. IMPRESSION: 1. Minimal left basilar atelectasis / scar without acute cardiopulmonary disease. 2. Mild lung hyperexpansion.  Emphysema. (ICD10-J43.9) 3.  Aortic Atherosclerosis (ICD10-170.0) Electronically Signed   By: Sandi Mariscal M.D.   On: 02/26/2016 09:35    Assessment & Plan:   Diagnoses and all orders for this visit:  DOE (dyspnea on exertion)  Obesity (BMI 30.0-34.9)  Barrett's esophagus with dysplasia   I am having [name] maintain his ARIPiprazole, sertraline, desvenlafaxine, tamsulosin, multivitamin-lutein, losartan, Cholecalciferol, atorvastatin, aspirin, and omeprazole.  No orders of the defined types were placed in this encounter.    Follow-up: Return in about 3 months (around 11/09/2017).  Walker Kehr, MD

## 2017-08-09 NOTE — Assessment & Plan Note (Signed)
Eliminate apple juice and sweat tea Use herbal fruit tea bags for iced tea Use sweeteners or agave syrup if you have to

## 2017-08-14 ENCOUNTER — Encounter: Payer: Self-pay | Admitting: Internal Medicine

## 2017-08-14 NOTE — Assessment & Plan Note (Signed)
Losartan 

## 2017-08-17 DIAGNOSIS — Z85820 Personal history of malignant melanoma of skin: Secondary | ICD-10-CM | POA: Diagnosis not present

## 2017-08-17 DIAGNOSIS — L821 Other seborrheic keratosis: Secondary | ICD-10-CM | POA: Diagnosis not present

## 2017-08-23 ENCOUNTER — Ambulatory Visit: Payer: PPO | Admitting: Internal Medicine

## 2017-08-23 ENCOUNTER — Ambulatory Visit (INDEPENDENT_AMBULATORY_CARE_PROVIDER_SITE_OTHER): Payer: PPO | Admitting: Internal Medicine

## 2017-08-23 ENCOUNTER — Encounter: Payer: Self-pay | Admitting: Internal Medicine

## 2017-08-23 VITALS — BP 140/80 | HR 60 | Ht 71.5 in | Wt 233.0 lb

## 2017-08-23 DIAGNOSIS — J4530 Mild persistent asthma, uncomplicated: Secondary | ICD-10-CM

## 2017-08-23 DIAGNOSIS — R0609 Other forms of dyspnea: Secondary | ICD-10-CM

## 2017-08-23 LAB — PULMONARY FUNCTION TEST
DL/VA % PRED: 86 %
DL/VA: 4.03 ml/min/mmHg/L
DLCO unc % pred: 78 %
DLCO unc: 27.12 ml/min/mmHg
FEF 25-75 POST: 2.53 L/s
FEF 25-75 Pre: 1.56 L/sec
FEF2575-%CHANGE-POST: 62 %
FEF2575-%PRED-POST: 109 %
FEF2575-%PRED-PRE: 67 %
FEV1-%Change-Post: 14 %
FEV1-%Pred-Post: 92 %
FEV1-%Pred-Pre: 80 %
FEV1-PRE: 2.6 L
FEV1-Post: 2.99 L
FEV1FVC-%CHANGE-POST: 7 %
FEV1FVC-%Pred-Pre: 93 %
FEV6-%Change-Post: 9 %
FEV6-%PRED-PRE: 88 %
FEV6-%Pred-Post: 96 %
FEV6-Post: 4.02 L
FEV6-Pre: 3.68 L
FEV6FVC-%Change-Post: 1 %
FEV6FVC-%Pred-Post: 104 %
FEV6FVC-%Pred-Pre: 102 %
FVC-%CHANGE-POST: 6 %
FVC-%PRED-POST: 92 %
FVC-%PRED-PRE: 86 %
FVC-PRE: 3.85 L
FVC-Post: 4.1 L
POST FEV1/FVC RATIO: 73 %
POST FEV6/FVC RATIO: 98 %
PRE FEV1/FVC RATIO: 68 %
Pre FEV6/FVC Ratio: 97 %
RV % pred: 135 %
RV: 3.58 L
TLC % pred: 104 %
TLC: 7.67 L

## 2017-08-23 MED ORDER — BUDESONIDE-FORMOTEROL FUMARATE 80-4.5 MCG/ACT IN AERO: 2.0000 | INHALATION_SPRAY | Freq: Two times a day (BID) | RESPIRATORY_TRACT | 11 refills | Status: DC

## 2017-08-23 MED ORDER — BUDESONIDE-FORMOTEROL FUMARATE 80-4.5 MCG/ACT IN AERO: 2.0000 | INHALATION_SPRAY | Freq: Two times a day (BID) | RESPIRATORY_TRACT | 0 refills | Status: DC

## 2017-08-23 NOTE — Patient Instructions (Signed)
Ok to use symbicort 80 up to 2 puffs every 12 hours if needed to see if helps your breathing with exertion    Work on inhaler technique:  relax and gently blow all the way out then take a nice smooth deep breath back in, triggering the inhaler at same time you start breathing in.  Hold for up to 5 seconds if you can. Blow out thru nose. Rinse and gargle with water when done      Please schedule a follow up visit in 3 months but call sooner if needed

## 2017-08-23 NOTE — Progress Notes (Signed)
PFT done today. 

## 2017-08-23 NOTE — Progress Notes (Signed)
Subjective:    Patient ID: Oscar [name], male    DOB: 09-14-1940   MRN: [id number]     Brief patient profile:  43 yowm quit smoking 1980 with h/o allergies x decades spring >  fall itching sneezing runny nose referred 03/01/2012 to pulmonary clinic by Plotnikov for sob with documented mild asthma by pfts 08/23/2017 along with very low erv c/w effects of obesity     History of Present Illness  03/01/2012 1st pulmonary eval/ Lenon Kuennen cc acute onset sob immediately whenever bends over sev years ago but then about a year and half prior to OV  Noted progressive decline in ex tol like when helping son move furniture, now gasping for air anytime ties shoes.  Able to walk a half a mile including some hills with minimal doe if not bending over. Takes prilosec before bfast daily. Also fatigue, rarely sore throat assoc with spring and fall weather changes.  rec GERD (REFLUX)  diet Walking 30 min daily where are continually short of breath but never out of breath Please schedule a follow up office visit in 4 weeks, sooner if needed with pft's   04/04/2012 f/u ov/Zaden Sako cc breathing some better since the last visit.  rec Keep walking and work on wt loss   07/09/2017    ov/Zitlaly Malson re: re-consult re sob p gained another 13 lb s ever losing any from last eval  Chief Complaint  Patient presents with  . Pulmonary Consult    Referred back by Dr. Alain Marion. Pt c/o increased SOB over the past several years. He states he used to get SOB only with bending over, but now he can get SOB "out of no where" even just at rest sometimes.   Dyspnea:  Finds ex boring / thinks may start golf / joined y but not using  Cough: no Sleep: very well s resp complaints paroxyms of sob last a few breaths to one min freq= 2-3 x per week  X years Does fine walking dog flat  Sob bending over to tie shoes  Has HH > barretts On ppi x 40 mg 3--45 min ac each am rec gerd diet  reg walking     08/23/2017  f/u ov/Deeanne Deininger re: mild asthma/  obesity  Chief Complaint  Patient presents with  . Follow-up    Breathing has improved back to his normal baseline. He c/o PND and cough with clear sputum.     Dyspnea:  Did joint Y/ walking track up to a mile s stopping / keeping up with his age group Cough: some pnd with allergy season/ using otc Sleep: fine  SABA use:  None   No obvious day to day or daytime variability or assoc excess/ purulent sputum or mucus plugs or hemoptysis or cp or chest tightness, subjective wheeze or overt sinus or hb symptoms. No unusual exposure hx or h/o childhood pna/ asthma or knowledge of premature birth.  Sleeping ok flat without nocturnal  or early am exacerbation  of respiratory  c/o's or need for noct saba. Also denies any obvious fluctuation of symptoms with weather or environmental changes or other aggravating or alleviating factors except as outlined above   Current Allergies, Complete Past Medical History, Past Surgical History, Family History, and Social History were reviewed in [employer] [facility] record.  ROS  The following are not active complaints unless bolded Hoarseness, sore throat, dysphagia, dental problems, itching, sneezing,  nasal congestion or discharge of excess mucus or purulent secretions, ear ache,  fever, chills, sweats, unintended wt loss or wt gain, classically pleuritic or exertional cp,  orthopnea pnd or leg swelling, presyncope, palpitations, abdominal pain, anorexia, nausea, vomiting, diarrhea  or change in bowel habits or change in bladder habits, change in stools or change in urine, dysuria, hematuria,  rash, arthralgias, visual complaints, headache, numbness, weakness or ataxia or problems with walking or coordination,  change in mood/affect or memory.        Current Meds  Medication Sig  . ARIPiprazole (ABILIFY) 10 MG tablet Take 10 mg by mouth daily.    Marland Kitchen aspirin 325 MG tablet Take 325 mg by mouth daily.  Marland Kitchen atorvastatin (LIPITOR) 40 MG tablet Take 1  tablet (40 mg total) daily by mouth.  . Cholecalciferol (RA VITAMIN D-3) 1000 units tablet Take 1 tablet (1,000 Units total) daily by mouth.  . desvenlafaxine (PRISTIQ) 50 MG 24 hr tablet Take 50 mg by mouth daily.  Marland Kitchen losartan (COZAAR) 100 MG tablet Take 1 tablet (100 mg total) daily by mouth.  . multivitamin-lutein (OCUVITE-LUTEIN) CAPS capsule Take 1 capsule daily by mouth.  Marland Kitchen omeprazole (PRILOSEC) 20 MG capsule Take 40 mg by mouth daily.  . sertraline (ZOLOFT) 100 MG tablet Take 100 mg by mouth daily.   . tamsulosin (FLOMAX) 0.4 MG CAPS capsule Take 0.4 mg by mouth daily.                       Objective:   Physical Exam  Pleasant amb mod obese wm nad    08/23/2017     233  07/09/2017   233  04/04/2012 220     03/01/12 216 lb (97.977 kg)  02/16/12 218 lb 8 oz (99.111 kg)  09/30/11 211 lb (95.709 kg)    Vital signs reviewed - Note on arrival 02 sats  98% on RA    HEENT: nl dentition, turbinates bilaterally, and oropharynx. Nl external ear canals without cough reflex   NECK :  without JVD/Nodes/TM/ nl carotid upstrokes bilaterally   LUNGS: no acc muscle use,  Nl contour chest which is clear to A and P bilaterally without cough on insp or exp maneuvers   CV:  RRR  no s3 or murmur or increase in P2, and no edema   ABD:  Obese/ soft and nontender with nl inspiratory excursion in the supine position. No bruits or organomegaly appreciated, bowel sounds nl  MS:  Nl gait/ ext warm without deformities, calf tenderness, cyanosis or clubbing No obvious joint restrictions   SKIN: warm and dry without lesions    NEURO:  alert, approp, nl sensorium with  no motor or cerebellar deficits apparent.             Assessment & Plan:

## 2017-08-24 ENCOUNTER — Encounter: Payer: Self-pay | Admitting: Internal Medicine

## 2017-08-24 DIAGNOSIS — J4530 Mild persistent asthma, uncomplicated: Secondary | ICD-10-CM | POA: Insufficient documentation

## 2017-08-24 NOTE — Assessment & Plan Note (Signed)
Spirometry 08/23/2017  FEV1 2.99 (92%)  Ratio 73 p 14% resp to saba - 08/23/2017  After extensive coaching inhaler device  effectiveness =    75% so try symb 80 up to 2 bid  Based on the study from NEJM  378; 20 p 1865 (2018) in pts with mild asthma it is reasonable to use low dose symbicort eg 80 2bid "prn"  breakthru symptoms in this setting but I emphasized this was only shown with symbicort and takes advantage of the rapid onset of action but is not the same as "rescue therapy" but can be stopped once the acute symptoms have resolved or there is no improvement in symptoms p symbicort as his largest problem is his wt reflected in low ERV on pfts 08/23/2017    I had an extended discussion with the patient reviewing all relevant studies completed to date and  lasting 15 to 20 minutes of a 25 minute visit    Each maintenance medication was reviewed in detail including most importantly the difference between maintenance and prns and under what circumstances the prns are to be triggered using an action plan format that is not reflected in the computer generated alphabetically organized AVS.    Please see AVS for specific instructions unique to this visit that I personally wrote and verbalized to the the pt in detail and then reviewed with pt  by my nurse highlighting any  changes in therapy recommended at today's visit to their plan of care.

## 2017-08-24 NOTE — Assessment & Plan Note (Signed)
-   PFT's 04/04/2012 FEV1  3.06 (98%) ratio 61 and no better p B2,  DLCO 114  ERV 16% - Myoview stress test: 05/14/17 neg for significant ischemia The patient exercised following the Bruce protocol.  The patient reported chest pain and shortness of breath during the stress test.  The test was stopped because the patient complained of fatigue, atypical chest pain and shortness of breath.  Heart rate demonstrated a normal response to exercise. Blood pressure demonstrated a hypertensive response to exercise. Overall, the patient's exercise capacity was mildly impaired.  - PFT's  08/23/2017  FEV1 2.99 (92 % ) ratio 63  p 14 % improvement from saba p nothing prior to study with DLCO  78 % corrects to 86 % for alv volume  And erv 32%  Clearly his doe is more likely to be due to obesity/ deconditioning than copd/asthma but reasonable to try symbicort > see chronic asthma  - and continue reg exercise / wt loss effors

## 2017-08-25 ENCOUNTER — Ambulatory Visit (INDEPENDENT_AMBULATORY_CARE_PROVIDER_SITE_OTHER): Payer: PPO | Admitting: Ophthalmology

## 2017-08-25 DIAGNOSIS — H35033 Hypertensive retinopathy, bilateral: Secondary | ICD-10-CM | POA: Diagnosis not present

## 2017-08-25 DIAGNOSIS — I10 Essential (primary) hypertension: Secondary | ICD-10-CM | POA: Diagnosis not present

## 2017-08-25 DIAGNOSIS — H353132 Nonexudative age-related macular degeneration, bilateral, intermediate dry stage: Secondary | ICD-10-CM

## 2017-08-25 DIAGNOSIS — H43813 Vitreous degeneration, bilateral: Secondary | ICD-10-CM | POA: Diagnosis not present

## 2017-08-25 DIAGNOSIS — H33301 Unspecified retinal break, right eye: Secondary | ICD-10-CM

## 2017-09-27 DIAGNOSIS — D225 Melanocytic nevi of trunk: Secondary | ICD-10-CM | POA: Diagnosis not present

## 2017-09-27 DIAGNOSIS — L821 Other seborrheic keratosis: Secondary | ICD-10-CM | POA: Diagnosis not present

## 2017-09-27 DIAGNOSIS — L570 Actinic keratosis: Secondary | ICD-10-CM | POA: Diagnosis not present

## 2017-09-27 DIAGNOSIS — L814 Other melanin hyperpigmentation: Secondary | ICD-10-CM | POA: Diagnosis not present

## 2017-09-27 DIAGNOSIS — Z85820 Personal history of malignant melanoma of skin: Secondary | ICD-10-CM | POA: Diagnosis not present

## 2017-09-27 DIAGNOSIS — D1801 Hemangioma of skin and subcutaneous tissue: Secondary | ICD-10-CM | POA: Diagnosis not present

## 2017-10-11 DIAGNOSIS — M25512 Pain in left shoulder: Secondary | ICD-10-CM | POA: Diagnosis not present

## 2017-11-10 ENCOUNTER — Ambulatory Visit (INDEPENDENT_AMBULATORY_CARE_PROVIDER_SITE_OTHER): Payer: PPO | Admitting: Internal Medicine

## 2017-11-10 ENCOUNTER — Other Ambulatory Visit (INDEPENDENT_AMBULATORY_CARE_PROVIDER_SITE_OTHER): Payer: PPO

## 2017-11-10 ENCOUNTER — Encounter: Payer: Self-pay | Admitting: Internal Medicine

## 2017-11-10 DIAGNOSIS — E785 Hyperlipidemia, unspecified: Secondary | ICD-10-CM | POA: Diagnosis not present

## 2017-11-10 DIAGNOSIS — D72829 Elevated white blood cell count, unspecified: Secondary | ICD-10-CM

## 2017-11-10 DIAGNOSIS — K22719 Barrett's esophagus with dysplasia, unspecified: Secondary | ICD-10-CM

## 2017-11-10 DIAGNOSIS — E669 Obesity, unspecified: Secondary | ICD-10-CM

## 2017-11-10 LAB — CBC WITH DIFFERENTIAL/PLATELET
BASOS ABS: 0 10*3/uL (ref 0.0–0.1)
Basophils Relative: 0.3 % (ref 0.0–3.0)
EOS ABS: 0.3 10*3/uL (ref 0.0–0.7)
Eosinophils Relative: 2.8 % (ref 0.0–5.0)
HCT: 42.1 % (ref 39.0–52.0)
Hemoglobin: 14.6 g/dL (ref 13.0–17.0)
Lymphocytes Relative: 25.8 % (ref 12.0–46.0)
Lymphs Abs: 2.3 10*3/uL (ref 0.7–4.0)
MCHC: 34.6 g/dL (ref 30.0–36.0)
MCV: 87.8 fl (ref 78.0–100.0)
Monocytes Absolute: 0.8 10*3/uL (ref 0.1–1.0)
Monocytes Relative: 8.5 % (ref 3.0–12.0)
NEUTROS ABS: 5.6 10*3/uL (ref 1.4–7.7)
Neutrophils Relative %: 62.6 % (ref 43.0–77.0)
Platelets: 246 10*3/uL (ref 150.0–400.0)
RBC: 4.79 Mil/uL (ref 4.22–5.81)
RDW: 14 % (ref 11.5–15.5)
WBC: 8.9 10*3/uL (ref 4.0–10.5)

## 2017-11-10 LAB — LIPID PANEL
CHOL/HDL RATIO: 3
Cholesterol: 181 mg/dL (ref 0–200)
HDL: 67.2 mg/dL (ref >39.00)
LDL CALC: 80 mg/dL (ref 0–99)
NonHDL: 114.15
TRIGLYCERIDES: 172 mg/dL — AB (ref 0.0–149.0)
VLDL: 34.4 mg/dL (ref 0.0–40.0)

## 2017-11-10 NOTE — Patient Instructions (Signed)
MC well w/Jill 

## 2017-11-10 NOTE — Assessment & Plan Note (Signed)
Wt Readings from Last 3 Encounters:  11/10/17 235 lb (106.6 kg)  08/23/17 233 lb (105.7 kg)  08/09/17 233 lb (105.7 kg)

## 2017-11-10 NOTE — Progress Notes (Signed)
Subjective:  Patient ID: Oscar [name], male    DOB: 08-Feb-1941  Age: 77 y.o. MRN: [id number]  CC: No chief complaint on file.   HPI Oscar [name] presents for HTN, depression, asthma, HH   Outpatient Medications Prior to Visit  Medication Sig Dispense Refill  . ARIPiprazole (ABILIFY) 10 MG tablet Take 10 mg by mouth daily.      Marland Kitchen aspirin 325 MG tablet Take 325 mg by mouth daily.    Marland Kitchen atorvastatin (LIPITOR) 40 MG tablet Take 1 tablet (40 mg total) daily by mouth. 90 tablet 0  . budesonide-formoterol (SYMBICORT) 80-4.5 MCG/ACT inhaler Inhale 2 puffs into the lungs 2 (two) times daily. 1 Inhaler 11  . budesonide-formoterol (SYMBICORT) 80-4.5 MCG/ACT inhaler Inhale 2 puffs into the lungs 2 (two) times daily. 1 Inhaler 0  . Cholecalciferol (RA VITAMIN D-3) 1000 units tablet Take 1 tablet (1,000 Units total) daily by mouth. 90 tablet 3  . desvenlafaxine (PRISTIQ) 50 MG 24 hr tablet Take 50 mg by mouth daily.    Marland Kitchen losartan (COZAAR) 100 MG tablet Take 1 tablet (100 mg total) daily by mouth. 90 tablet 4  . multivitamin-lutein (OCUVITE-LUTEIN) CAPS capsule Take 1 capsule daily by mouth.    Marland Kitchen omeprazole (PRILOSEC) 20 MG capsule Take 40 mg by mouth daily.    . sertraline (ZOLOFT) 100 MG tablet Take 100 mg by mouth daily.     . tamsulosin (FLOMAX) 0.4 MG CAPS capsule Take 0.4 mg by mouth daily.     No facility-administered medications prior to visit.     ROS: Review of Systems  Constitutional: Negative for appetite change, fatigue and unexpected weight change.  HENT: Negative for congestion, nosebleeds, sneezing, sore throat and trouble swallowing.   Eyes: Negative for itching and visual disturbance.  Respiratory: Negative for cough.   Cardiovascular: Negative for chest pain, palpitations and leg swelling.  Gastrointestinal: Negative for abdominal distention, blood in stool, diarrhea and nausea.  Genitourinary: Negative for frequency and hematuria.  Musculoskeletal: Positive for  arthralgias. Negative for back pain, gait problem, joint swelling and neck pain.  Skin: Negative for rash.  Neurological: Negative for dizziness, tremors, speech difficulty and weakness.  Psychiatric/Behavioral: Negative for agitation, dysphoric mood, sleep disturbance and suicidal ideas. The patient is not nervous/anxious.     Objective:  BP 134/78 (BP Location: Left Arm, Patient Position: Sitting, Cuff Size: Large)   Pulse 60   Temp 98.6 F (37 C) (Oral)   Ht 5' 11.5" (1.816 m)   Wt 235 lb (106.6 kg)   SpO2 98%   BMI 32.32 kg/m   BP Readings from Last 3 Encounters:  11/10/17 134/78  08/23/17 140/80  08/09/17 128/78    Wt Readings from Last 3 Encounters:  11/10/17 235 lb (106.6 kg)  08/23/17 233 lb (105.7 kg)  08/09/17 233 lb (105.7 kg)    Physical Exam  Constitutional: He is oriented to person, place, and time. He appears well-developed. No distress.  NAD  HENT:  Mouth/Throat: Oropharynx is clear and moist.  Eyes: Pupils are equal, round, and reactive to light. Conjunctivae are normal.  Neck: Normal range of motion. No JVD present. No thyromegaly present.  Cardiovascular: Normal rate, regular rhythm, normal heart sounds and intact distal pulses. Exam reveals no gallop and no friction rub.  No murmur heard. Pulmonary/Chest: Effort normal and breath sounds normal. No respiratory distress. He has no wheezes. He has no rales. He exhibits no tenderness.  Abdominal: Soft. Bowel sounds are normal. He exhibits no distension  and no mass. There is no tenderness. There is no rebound and no guarding.  Musculoskeletal: Normal range of motion. He exhibits tenderness. He exhibits no edema.  Lymphadenopathy:    He has no cervical adenopathy.  Neurological: He is alert and oriented to person, place, and time. He has normal reflexes. No cranial nerve deficit. He exhibits normal muscle tone. He displays a negative Romberg sign. Coordination and gait normal.  Skin: Skin is warm and dry. No  rash noted.  Psychiatric: He has a normal mood and affect. His behavior is normal. Judgment and thought content normal.  obese  Lab Results  Component Value Date   WBC 12.2 (H) 06/04/2017   HGB 14.2 06/04/2017   HCT 42.9 06/04/2017   PLT 294.0 06/04/2017   GLUCOSE 96 06/04/2017   CHOL 173 06/04/2017   TRIG 224.0 (H) 06/04/2017   HDL 59.40 06/04/2017   LDLDIRECT 99.0 06/04/2017   LDLCALC 78 02/26/2016   ALT 23 06/04/2017   AST 19 06/04/2017   NA 138 06/04/2017   K 4.2 06/04/2017   CL 102 06/04/2017   CREATININE 0.77 06/04/2017   BUN 10 06/04/2017   CO2 26 06/04/2017   TSH 2.02 06/04/2017   PSA 4.29 (H) 02/26/2016   HGBA1C 5.6 03/22/2014    Dg Chest 2 View  Result Date: 02/26/2016 CLINICAL DATA:  Dyspnea on exertion for the past year. History of hypertension. EXAM: CHEST  2 VIEW COMPARISON:  02/16/2012 FINDINGS: Normal cardiac silhouette and mediastinal contours with atherosclerotic plaque within the thoracic aorta. The lungs appear mildly hyperexpanded with flattening the bilaterally diaphragms. Minimal left basilar linear heterogeneous opacities favored to represent atelectasis or scar. No new focal airspace opacities. No pleural effusion or pneumothorax. No evidence of edema. No acute osseous abnormalities. IMPRESSION: 1. Minimal left basilar atelectasis / scar without acute cardiopulmonary disease. 2. Mild lung hyperexpansion.  Emphysema. (ICD10-J43.9) 3.  Aortic Atherosclerosis (ICD10-170.0) Electronically Signed   By: Sandi Mariscal M.D.   On: 02/26/2016 09:35    Assessment & Plan:   There are no diagnoses linked to this encounter.   No orders of the defined types were placed in this encounter.    Follow-up: No follow-ups on file.  Walker Kehr, MD

## 2017-11-10 NOTE — Assessment & Plan Note (Signed)
Prilosec 

## 2017-11-10 NOTE — Assessment & Plan Note (Signed)
Lipitor 

## 2017-11-29 ENCOUNTER — Encounter: Payer: Self-pay | Admitting: Internal Medicine

## 2017-11-29 ENCOUNTER — Ambulatory Visit: Payer: PPO | Admitting: Internal Medicine

## 2017-11-29 VITALS — BP 130/70 | HR 66 | Ht 71.5 in | Wt 232.8 lb

## 2017-11-29 DIAGNOSIS — J4530 Mild persistent asthma, uncomplicated: Secondary | ICD-10-CM | POA: Diagnosis not present

## 2017-11-29 DIAGNOSIS — R0609 Other forms of dyspnea: Secondary | ICD-10-CM | POA: Diagnosis not present

## 2017-11-29 DIAGNOSIS — R972 Elevated prostate specific antigen [PSA]: Secondary | ICD-10-CM | POA: Diagnosis not present

## 2017-11-29 DIAGNOSIS — K2270 Barrett's esophagus without dysplasia: Secondary | ICD-10-CM | POA: Diagnosis not present

## 2017-11-29 NOTE — Assessment & Plan Note (Signed)
Spirometry 08/23/2017  FEV1 2.99 (92%)  Ratio 73 p 14% resp to saba - 08/23/2017  After extensive coaching inhaler device  effectiveness =    75% so try symb 80 up to 2 bid  > resolved   Based on two studies from [facility]  378; 20 p 1865 (2018) and 380 : p2020-30 (2019) in pts with mild asthma it is reasonable to use low dose symbicort eg 80 2bid "prn" flare in this setting but I emphasized this was only shown with symbicort and takes advantage of the rapid onset of action but is not the same as "rescue therapy" but can be stopped once the acute symptoms have resolved and the need for rescue has been minimized (< 2 x weekly)    All goals of chronic asthma control met including optimal function and elimination of symptoms with no  need for rescue therapy using symbicort 80 2 bid as a "prn"   Contingencies discussed in full including contacting this office immediately if not controlling the symptoms using the symbicort 80 up to 2 bid

## 2017-11-29 NOTE — Assessment & Plan Note (Signed)
-   PFT's 04/04/2012 FEV1  3.06 (98%) ratio 61 and no better p B2,  DLCO 114  ERV 16% - Myoview stress test: 05/14/17 neg for significant ischemia The patient exercised following the Bruce protocol.  The patient reported chest pain and shortness of breath during the stress test.  The test was stopped because the patient complained of fatigue, atypical chest pain and shortness of breath.  Heart rate demonstrated a normal response to exercise. Blood pressure demonstrated a hypertensive response to exercise. Overall, the patient's exercise capacity was mildly impaired.  - PFT's  08/23/2017  FEV1 2.99 (92 % ) ratio 63  p 14 % improvement from saba p nothing prior to study with DLCO  78 % corrects to 86 % for alv volume  And erv 32%  I had an extended final summary discussion with the patient reviewing all relevant studies completed to date and  lasting 15 to 20 minutes of a 25 minute visit on the following issues:   His cc of sob with one breath is typical of failure to relax to frc between breaths and is best addressed by exercise/ wt loss and f/u here prn   Each maintenance medication was reviewed in detail including most importantly the difference between maintenance and as needed and under what circumstances the prns are to be used.  Please see AVS for specific  Instructions which are unique to this visit and I personally typed out  which were reviewed in detail in writing with the patient and a copy provided.

## 2017-11-29 NOTE — Progress Notes (Signed)
Subjective:    Patient ID: Oscar [name], male    DOB: 12-Aug-1940   MRN: [id number]     Brief patient profile:  75 yowm quit smoking 1980 with h/o allergies x decades spring >  fall itching sneezing runny nose referred 03/01/2012 to pulmonary clinic by Plotnikov for sob with documented mild asthma by pfts 08/23/2017 along with very low erv c/w effects of obesity     History of Present Illness  03/01/2012 1st pulmonary eval/ Danyael Alipio cc acute onset sob immediately whenever bends over sev years ago but then about a year and half prior to OV  Noted progressive decline in ex tol like when helping son move furniture, now gasping for air anytime ties shoes.  Able to walk a half a mile including some hills with minimal doe if not bending over. Takes prilosec before bfast daily. Also fatigue, rarely sore throat assoc with spring and fall weather changes.  rec GERD (REFLUX)  diet Walking 30 min daily where are continually short of breath but never out of breath Please schedule a follow up office visit in 4 weeks, sooner if needed with pft's   04/04/2012 f/u ov/Silena Wyss cc breathing some better since the last visit.  rec Keep walking and work on wt loss   07/09/2017    ov/Shigeo Baugh re: re-consult re sob p gained another 13 lb s ever losing any from last eval  Chief Complaint  Patient presents with  . Pulmonary Consult    Referred back by Dr. Alain Marion. Pt c/o increased SOB over the past several years. He states he used to get SOB only with bending over, but now he can get SOB "out of no where" even just at rest sometimes.   Dyspnea:  Finds ex boring / thinks may start golf / joined y but not using  Cough: no Sleep: very well s resp complaints paroxyms of sob last a few breaths to one min freq= 2-3 x per week  X years Does fine walking dog flat  Sob bending over to tie shoes  Has HH > barretts On ppi x 40 mg 3--45 min ac each am rec gerd diet reg walking     08/23/2017  f/u ov/Ronney [employer] re: mild asthma/  obesity  Chief Complaint  Patient presents with  . Follow-up    Breathing has improved back to his normal baseline. He c/o PND and cough with clear sputum.     Dyspnea:  Did joint Y/ walking track up to a mile s stopping / keeping up with his age group Cough: some pnd with allergy season/ using otc Sleep: fine  SABA use:  None rec Ok to use symbicort 80 up to 2 puffs every 12 hours if needed to see if helps your breathing with exertion  Work on inhaler technique:    Please schedule a follow up visit in 3 months but call sooner if needed     11/29/2017  f/u ov/Kayelynn Abdou re: very mild asthma  Chief Complaint  Patient presents with  . Follow-up    Breathing is "fine".  He states he is not using the symbicort at all.   Dyspnea:  Walk dog fine / no other regular ex Cough: none  Sleeping: prop up 2 pillows due to HB on q am ppi  SABA use: none 02: none   Sob lasts one breath seems to happen in shower, not with ex   No obvious day to day or daytime variability or assoc excess/ purulent sputum or  mucus plugs or hemoptysis or cp or chest tightness, subjective wheeze or overt sinus or hb symptoms.   Sleeping with hob up as above  without nocturnal  or early am exacerbation  of respiratory  c/o's or need for noct saba. Also denies any obvious fluctuation of symptoms with weather or environmental changes or other aggravating or alleviating factors except as outlined above   No unusual exposure hx or h/o childhood pna/ asthma or knowledge of premature birth.  Current Allergies, Complete Past Medical History, Past Surgical History, Family History, and Social History were reviewed in [employer] record.  ROS  The following are not active complaints unless bolded Hoarseness, sore throat, dysphagia, dental problems, itching, sneezing,  nasal congestion or discharge of excess mucus or purulent secretions, ear ache,   fever, chills, sweats, unintended wt loss or wt gain,  classically pleuritic or exertional cp,  orthopnea pnd or arm/hand swelling  or leg swelling, presyncope, palpitations, abdominal pain, anorexia, nausea, vomiting, diarrhea  or change in bowel habits or change in bladder habits, change in stools or change in urine, dysuria, hematuria,  rash, arthralgias, visual complaints, headache, numbness, weakness or ataxia or problems with walking or coordination,  change in mood or  memory.        Current Meds  Medication Sig  . ARIPiprazole (ABILIFY) 10 MG tablet Take 10 mg by mouth daily.    Marland Kitchen aspirin 325 MG tablet Take 325 mg by mouth daily.  Marland Kitchen atorvastatin (LIPITOR) 40 MG tablet Take 1 tablet (40 mg total) daily by mouth.  . budesonide-formoterol (SYMBICORT) 80-4.5 MCG/ACT inhaler Inhale 2 puffs into the lungs 2 (two) times daily. (Patient taking differently: Inhale 2 puffs into the lungs 2 (two) times daily as needed. )  . Cholecalciferol (RA VITAMIN D-3) 1000 units tablet Take 1 tablet (1,000 Units total) daily by mouth.  . desvenlafaxine (PRISTIQ) 50 MG 24 hr tablet Take 50 mg by mouth daily.  Marland Kitchen losartan (COZAAR) 100 MG tablet Take 1 tablet (100 mg total) daily by mouth.  . multivitamin-lutein (OCUVITE-LUTEIN) CAPS capsule Take 1 capsule daily by mouth.  Marland Kitchen omeprazole (PRILOSEC) 20 MG capsule Take 40 mg by mouth daily.  . sertraline (ZOLOFT) 100 MG tablet Take 100 mg by mouth daily.   . tamsulosin (FLOMAX) 0.4 MG CAPS capsule Take 0.4 mg by mouth daily.                   Objective:   Physical Exam  Pleasant amb wm nad / minimal wheeze with fvc resolves with plm  11/29/2017     232 08/23/2017     233  07/09/2017   233  04/04/2012 220     03/01/12 216 lb (97.977 kg)  02/16/12 218 lb 8 oz (99.111 kg)  09/30/11 211 lb (95.709 kg)    Vital signs reviewed - Note on arrival 02 sats  100% on RA    HEENT: nl dentition, turbinates bilaterally, and oropharynx. Nl external ear canals without cough reflex   NECK :  without JVD/Nodes/TM/ nl  carotid upstrokes bilaterally   LUNGS: no acc muscle use,  Nl contour chest which is clear to A and P bilaterally without cough on insp or exp maneuvers   CV:  RRR  no s3 or murmur or increase in P2, and no edema   ABD:  Obese/soft and nontender with nl inspiratory excursion in the supine position. No bruits or organomegaly appreciated, bowel sounds nl  MS:  Nl gait/  ext warm without deformities, calf tenderness, cyanosis or clubbing No obvious joint restrictions   SKIN: warm and dry without lesions    NEURO:  alert, approp, nl sensorium with  no motor or cerebellar deficits apparent.           Assessment & Plan:

## 2017-11-29 NOTE — Assessment & Plan Note (Signed)
Still having occ noct hb > reviewed diet and add h2 hs prn with f/u with GI/ Dr Alain Marion planned

## 2017-11-29 NOTE — Patient Instructions (Addendum)
Pepcid 20 mg one hour before bed is the best night time treatment for acid suppression   Weight control is simply a matter of calorie balance which needs to be tilted in your favor by eating less and exercising more.  To get the most out of exercise, you need to be continuously aware that you are short of breath, but never out of breath, for 30 minutes daily. As you improve, it will actually be easier for you to do the same amount of exercise  in  30 minutes so always push to the level where you are short of breath.  If this does not result in gradual weight reduction then I strongly recommend you see a nutritionist with a food diary x 2 weeks so that we can work out a negative calorie balance which is universally effective in steady weight loss programs.  Think of your calorie balance like you do your bank account where in this case you want the balance to go down so you must take in less calories than you burn up.  It's just that simple:  Hard to do, but easy to understand.  Good luck!      If you are satisfied with your treatment plan,  let your doctor know and he/she can either refill your medications or you can return here when your prescription runs out.     If in any way you are not 100% satisfied,  please tell us.  If 100% better, tell your friends!  Pulmonary follow up is as needed

## 2017-12-08 DIAGNOSIS — R972 Elevated prostate specific antigen [PSA]: Secondary | ICD-10-CM | POA: Diagnosis not present

## 2017-12-08 DIAGNOSIS — R3915 Urgency of urination: Secondary | ICD-10-CM | POA: Diagnosis not present

## 2017-12-08 DIAGNOSIS — N401 Enlarged prostate with lower urinary tract symptoms: Secondary | ICD-10-CM | POA: Diagnosis not present

## 2017-12-31 ENCOUNTER — Encounter (INDEPENDENT_AMBULATORY_CARE_PROVIDER_SITE_OTHER): Payer: PPO | Admitting: Ophthalmology

## 2017-12-31 DIAGNOSIS — H353132 Nonexudative age-related macular degeneration, bilateral, intermediate dry stage: Secondary | ICD-10-CM

## 2017-12-31 DIAGNOSIS — H33301 Unspecified retinal break, right eye: Secondary | ICD-10-CM

## 2017-12-31 DIAGNOSIS — H35033 Hypertensive retinopathy, bilateral: Secondary | ICD-10-CM | POA: Diagnosis not present

## 2017-12-31 DIAGNOSIS — H43813 Vitreous degeneration, bilateral: Secondary | ICD-10-CM

## 2017-12-31 DIAGNOSIS — I10 Essential (primary) hypertension: Secondary | ICD-10-CM

## 2018-03-09 DIAGNOSIS — N401 Enlarged prostate with lower urinary tract symptoms: Secondary | ICD-10-CM | POA: Diagnosis not present

## 2018-03-09 DIAGNOSIS — R3912 Poor urinary stream: Secondary | ICD-10-CM | POA: Diagnosis not present

## 2018-04-06 ENCOUNTER — Ambulatory Visit: Payer: PPO

## 2018-04-15 ENCOUNTER — Other Ambulatory Visit: Payer: Self-pay

## 2018-04-15 MED ORDER — ATORVASTATIN CALCIUM 40 MG PO TABS: 40.0000 mg | ORAL_TABLET | Freq: Every day | ORAL | 3 refills | Status: DC

## 2018-04-18 ENCOUNTER — Other Ambulatory Visit: Payer: Self-pay

## 2018-04-18 MED ORDER — LOSARTAN POTASSIUM 100 MG PO TABS: 100.0000 mg | ORAL_TABLET | Freq: Every day | ORAL | 3 refills | Status: DC

## 2018-04-28 ENCOUNTER — Other Ambulatory Visit: Payer: Self-pay

## 2018-04-28 ENCOUNTER — Ambulatory Visit: Payer: PPO

## 2018-04-28 MED ORDER — CHOLECALCIFEROL 25 MCG (1000 UT) PO TABS: 1000.0000 [IU] | ORAL_TABLET | Freq: Every day | ORAL | 3 refills | Status: DC

## 2018-04-29 ENCOUNTER — Ambulatory Visit: Payer: PPO

## 2018-05-12 ENCOUNTER — Encounter: Payer: Self-pay | Admitting: Internal Medicine

## 2018-05-12 ENCOUNTER — Other Ambulatory Visit (INDEPENDENT_AMBULATORY_CARE_PROVIDER_SITE_OTHER): Payer: PPO

## 2018-05-12 ENCOUNTER — Ambulatory Visit (INDEPENDENT_AMBULATORY_CARE_PROVIDER_SITE_OTHER): Payer: PPO | Admitting: Internal Medicine

## 2018-05-12 DIAGNOSIS — E669 Obesity, unspecified: Secondary | ICD-10-CM

## 2018-05-12 DIAGNOSIS — R5383 Other fatigue: Secondary | ICD-10-CM

## 2018-05-12 DIAGNOSIS — R270 Ataxia, unspecified: Secondary | ICD-10-CM

## 2018-05-12 DIAGNOSIS — M7661 Achilles tendinitis, right leg: Secondary | ICD-10-CM

## 2018-05-12 DIAGNOSIS — R635 Abnormal weight gain: Secondary | ICD-10-CM

## 2018-05-12 DIAGNOSIS — M7660 Achilles tendinitis, unspecified leg: Secondary | ICD-10-CM | POA: Insufficient documentation

## 2018-05-12 LAB — CBC WITH DIFFERENTIAL/PLATELET
Basophils Absolute: 0 10*3/uL (ref 0.0–0.1)
Basophils Relative: 0.3 % (ref 0.0–3.0)
Eosinophils Absolute: 0.2 10*3/uL (ref 0.0–0.7)
Eosinophils Relative: 2.6 % (ref 0.0–5.0)
HCT: 42.7 % (ref 39.0–52.0)
Hemoglobin: 14.4 g/dL (ref 13.0–17.0)
Lymphocytes Relative: 23.9 % (ref 12.0–46.0)
Lymphs Abs: 2.2 10*3/uL (ref 0.7–4.0)
MCHC: 33.7 g/dL (ref 30.0–36.0)
MCV: 89.6 fl (ref 78.0–100.0)
Monocytes Absolute: 0.7 10*3/uL (ref 0.1–1.0)
Monocytes Relative: 7.8 % (ref 3.0–12.0)
Neutro Abs: 6.1 10*3/uL (ref 1.4–7.7)
Neutrophils Relative %: 65.4 % (ref 43.0–77.0)
Platelets: 231 10*3/uL (ref 150.0–400.0)
RBC: 4.77 Mil/uL (ref 4.22–5.81)
RDW: 13.9 % (ref 11.5–15.5)
WBC: 9.4 10*3/uL (ref 4.0–10.5)

## 2018-05-12 LAB — BASIC METABOLIC PANEL
BUN: 15 mg/dL (ref 6–23)
CO2: 27 mEq/L (ref 19–32)
Calcium: 9.1 mg/dL (ref 8.4–10.5)
Chloride: 106 mEq/L (ref 96–112)
Creatinine, Ser: 0.85 mg/dL (ref 0.40–1.50)
GFR: 92.98 mL/min (ref >60.00)
Glucose, Bld: 102 mg/dL — ABNORMAL HIGH (ref 70–99)
Potassium: 5 mEq/L (ref 3.5–5.1)
Sodium: 141 mEq/L (ref 135–145)

## 2018-05-12 LAB — TSH: TSH: 1.71 u[IU]/mL (ref 0.35–4.50)

## 2018-05-12 MED ORDER — ZOSTER VAC RECOMB ADJUVANTED 50 MCG/0.5ML IM SUSR: 0.5000 mL | Freq: Once | INTRAMUSCULAR | 1 refills | Status: AC

## 2018-05-12 MED ORDER — ZOSTER VAC RECOMB ADJUVANTED 50 MCG/0.5ML IM SUSR: 0.5000 mL | Freq: Once | INTRAMUSCULAR | 1 refills | Status: DC

## 2018-05-12 NOTE — Assessment & Plan Note (Signed)
Diet discussed 

## 2018-05-12 NOTE — Assessment & Plan Note (Signed)
Heel lift, ice

## 2018-05-12 NOTE — Patient Instructions (Addendum)
Chair yoga or basic yoga, Tai Chi for balance 1/4 inch heel lift in shoes   Achilles Tendinitis  Achilles tendinitis is inflammation of the tough, cord-like band that attaches the lower leg muscles to the heel bone (Achilles tendon). This is usually caused by overusing the tendon and the ankle joint. Achilles tendinitis usually gets better over time with treatment and caring for yourself at home. It can take weeks or months to heal completely. What are the causes? This condition may be caused by:  A sudden increase in exercise or activity, such as running.  Doing the same exercises or activities (such as jumping) over and over.  Not warming up calf muscles before exercising.  Exercising in shoes that are worn out or not made for exercise.  Having arthritis or a bone growth (spur) on the back of the heel bone. This can rub against the tendon and hurt it.  Age-related wear and tear. Tendons become less flexible with age and more likely to be injured. What are the signs or symptoms? Common symptoms of this condition include:  Pain in the Achilles tendon or in the back of the leg, just above the heel. The pain usually gets worse with exercise.  Stiffness or soreness in the back of the leg, especially in the morning.  Swelling of the skin over the Achilles tendon.  Thickening of the tendon.  Bone spurs at the bottom of the Achilles tendon, near the heel.  Trouble standing on tiptoe. How is this diagnosed? This condition is diagnosed based on your symptoms and a physical exam. You may have tests, including:  X-rays.  MRI. How is this treated? The goal of treatment is to relieve symptoms and help your injury heal. Treatment may include:  Decreasing or stopping activities that caused the tendinitis. This may mean switching to low-impact exercises like biking or swimming.  Icing the injured area.  Doing physical therapy, including strengthening and stretching  exercises.  NSAIDs to help relieve pain and swelling.  Using supportive shoes, wraps, heel lifts, or a walking boot (air cast).  Surgery. This may be done if your symptoms do not improve after 6 months.  Using high-energy shock wave impulses to stimulate the healing process (extracorporeal shock wave therapy). This is rare.  Injection of medicines to help relieve inflammation (corticosteroids). This is rare. Follow these instructions at home: If you have an air cast:  Wear the cast as told by your health care provider. Remove it only as told by your health care provider.  Loosen the cast if your toes tingle, become numb, or turn cold and blue. Activity  Gradually return to your normal activities once your health care provider approves. Do not do activities that cause pain. ? Consider doing low-impact exercises, like cycling or swimming.  If you have an air cast, ask your health care provider when it is safe for you to drive.  If physical therapy was prescribed, do exercises as told by your health care provider or physical therapist. Managing pain, stiffness, and swelling   Raise (elevate) your foot above the level of your heart while you are sitting or lying down.  Move your toes often to avoid stiffness and to lessen swelling.  If directed, put ice on the injured area: ? Put ice in a plastic bag. ? Place a towel between your skin and the bag. ? Leave the ice on for 20 minutes, 2-3 times a day General instructions  If directed, wrap your foot with an  elastic bandage or other wrap. This can help keep your tendon from moving too much while it heals. Your health care provider will show you how to wrap your foot correctly.  Wear supportive shoes or heel lifts only as told by your health care provider.  Take over-the-counter and prescription medicines only as told by your health care provider.  Keep all follow-up visits as told by your health care provider. This is  important. Contact a health care provider if:  You have symptoms that gets worse.  You have pain that does not get better with medicine.  You develop new, unexplained symptoms.  You develop warmth and swelling in your foot.  You have a fever. Get help right away if:  You have a sudden popping sound or sensation in your Achilles tendon followed by severe pain.  You cannot move your toes or foot.  You cannot put any weight on your foot. Summary  Achilles tendinitis is inflammation of the tough, cord-like band that attaches the lower leg muscles to the heel bone (Achilles tendon).  This condition is usually caused by overusing the tendon and the ankle joint. It can also be caused by arthritis or normal aging.  The most common symptoms of this condition include pain, swelling, or stiffness in the Achilles tendon or in the back of the leg.  This condition is usually treated with rest, NSAIDs, and physical therapy. This information is not intended to replace advice given to you by your health care provider. Make sure you discuss any questions you have with your health care provider. Document Released: 02/18/2005 Document Revised: 03/30/2016 Document Reviewed: 03/30/2016 Elsevier Interactive Patient Education  2019 [employer].   Well w/Jill

## 2018-05-12 NOTE — Assessment & Plan Note (Signed)
Chair yoga, [place]

## 2018-05-12 NOTE — Assessment & Plan Note (Signed)
Wt Readings from Last 3 Encounters:  05/12/18 238 lb (108 kg)  11/29/17 232 lb 12.8 oz (105.6 kg)  11/10/17 235 lb (106.6 kg)  diet discussed

## 2018-05-12 NOTE — Assessment & Plan Note (Signed)
Stress test ok Labs Wt loss

## 2018-05-12 NOTE — Progress Notes (Signed)
Subjective:  Patient ID: Oscar [name], male    DOB: December 21, 1940  Age: 77 y.o. MRN: [id number]  CC: No chief complaint on file.   HPI Oscar [name] presents for fatigue per wife C/o mild balance issues C/o R achilles tendon pain x months F/u HTN, dyslipidemia  Outpatient Medications Prior to Visit  Medication Sig Dispense Refill  . ARIPiprazole (ABILIFY) 10 MG tablet Take 10 mg by mouth daily.      Marland Kitchen aspirin 325 MG tablet Take 325 mg by mouth daily.    Marland Kitchen atorvastatin (LIPITOR) 40 MG tablet Take 1 tablet (40 mg total) by mouth daily. 90 tablet 3  . budesonide-formoterol (SYMBICORT) 80-4.5 MCG/ACT inhaler Inhale 2 puffs into the lungs 2 (two) times daily. (Patient taking differently: Inhale 2 puffs into the lungs 2 (two) times daily as needed. ) 1 Inhaler 11  . Cholecalciferol (RA VITAMIN D-3) 25 MCG (1000 UT) tablet Take 1 tablet (1,000 Units total) by mouth daily. 90 tablet 3  . desvenlafaxine (PRISTIQ) 50 MG 24 hr tablet Take 50 mg by mouth daily.    Marland Kitchen losartan (COZAAR) 100 MG tablet Take 1 tablet (100 mg total) by mouth daily. 90 tablet 3  . multivitamin-lutein (OCUVITE-LUTEIN) CAPS capsule Take 1 capsule daily by mouth.    Marland Kitchen omeprazole (PRILOSEC) 20 MG capsule Take 40 mg by mouth daily.    . sertraline (ZOLOFT) 100 MG tablet Take 100 mg by mouth daily.     . tamsulosin (FLOMAX) 0.4 MG CAPS capsule Take 0.4 mg by mouth daily.     No facility-administered medications prior to visit.     ROS: Review of Systems  Constitutional: Positive for fatigue. Negative for appetite change and unexpected weight change.  HENT: Negative for congestion, nosebleeds, sneezing, sore throat and trouble swallowing.   Eyes: Negative for itching and visual disturbance.  Respiratory: Negative for cough.   Cardiovascular: Negative for chest pain, palpitations and leg swelling.  Gastrointestinal: Negative for abdominal distention, blood in stool, diarrhea and nausea.  Genitourinary: Negative for  frequency and hematuria.  Musculoskeletal: Positive for arthralgias. Negative for back pain, gait problem, joint swelling and neck pain.  Skin: Negative for rash.  Neurological: Negative for dizziness, tremors, speech difficulty and weakness.  Psychiatric/Behavioral: Negative for agitation, dysphoric mood and sleep disturbance. The patient is not nervous/anxious.     Objective:  BP (!) 148/82 (BP Location: Left Arm, Patient Position: Sitting, Cuff Size: Large)   Pulse (!) 56   Temp 98.5 F (36.9 C) (Oral)   Ht 5' 11.5" (1.816 m)   Wt 238 lb (108 kg)   SpO2 97%   BMI 32.73 kg/m   BP Readings from Last 3 Encounters:  05/12/18 (!) 148/82  11/29/17 130/70  11/10/17 134/78    Wt Readings from Last 3 Encounters:  05/12/18 238 lb (108 kg)  11/29/17 232 lb 12.8 oz (105.6 kg)  11/10/17 235 lb (106.6 kg)    Physical Exam Constitutional:      General: He is not in acute distress.    Appearance: He is well-developed.     Comments: NAD  Eyes:     Conjunctiva/sclera: Conjunctivae normal.     Pupils: Pupils are equal, round, and reactive to light.  Neck:     Musculoskeletal: Normal range of motion.     Thyroid: No thyromegaly.     Vascular: No JVD.  Cardiovascular:     Rate and Rhythm: Normal rate and regular rhythm.     Heart sounds: Normal  heart sounds. No murmur. No friction rub. No gallop.   Pulmonary:     Effort: Pulmonary effort is normal. No respiratory distress.     Breath sounds: Normal breath sounds. No wheezing or rales.  Chest:     Chest wall: No tenderness.  Abdominal:     General: Bowel sounds are normal. There is no distension.     Palpations: Abdomen is soft. There is no mass.     Tenderness: There is no abdominal tenderness. There is no guarding or rebound.  Musculoskeletal: Normal range of motion.        General: No tenderness.  Lymphadenopathy:     Cervical: No cervical adenopathy.  Skin:    General: Skin is warm and dry.     Findings: No rash.    Neurological:     Mental Status: He is alert and oriented to person, place, and time.     Cranial Nerves: No cranial nerve deficit.     Motor: No abnormal muscle tone.     Coordination: Coordination normal.     Gait: Gait normal.     Deep Tendon Reflexes: Reflexes are normal and symmetric.  Psychiatric:        Behavior: Behavior normal.        Thought Content: Thought content normal.        Judgment: Judgment normal.   R achilles tendon is tender Obese   Lab Results  Component Value Date   WBC 8.9 11/10/2017   HGB 14.6 11/10/2017   HCT 42.1 11/10/2017   PLT 246.0 11/10/2017   GLUCOSE 96 06/04/2017   CHOL 181 11/10/2017   TRIG 172.0 (H) 11/10/2017   HDL 67.20 11/10/2017   LDLDIRECT 99.0 06/04/2017   LDLCALC 80 11/10/2017   ALT 23 06/04/2017   AST 19 06/04/2017   NA 138 06/04/2017   K 4.2 06/04/2017   CL 102 06/04/2017   CREATININE 0.77 06/04/2017   BUN 10 06/04/2017   CO2 26 06/04/2017   TSH 2.02 06/04/2017   PSA 4.29 (H) 02/26/2016   HGBA1C 5.6 03/22/2014    Dg Chest 2 View  Result Date: 02/26/2016 CLINICAL DATA:  Dyspnea on exertion for the past year. History of hypertension. EXAM: CHEST  2 VIEW COMPARISON:  02/16/2012 FINDINGS: Normal cardiac silhouette and mediastinal contours with atherosclerotic plaque within the thoracic aorta. The lungs appear mildly hyperexpanded with flattening the bilaterally diaphragms. Minimal left basilar linear heterogeneous opacities favored to represent atelectasis or scar. No new focal airspace opacities. No pleural effusion or pneumothorax. No evidence of edema. No acute osseous abnormalities. IMPRESSION: 1. Minimal left basilar atelectasis / scar without acute cardiopulmonary disease. 2. Mild lung hyperexpansion.  Emphysema. (ICD10-J43.9) 3.  Aortic Atherosclerosis (ICD10-170.0) Electronically Signed   By: Sandi Mariscal M.D.   On: 02/26/2016 09:35    Assessment & Plan:   There are no diagnoses linked to this encounter.   No orders  of the defined types were placed in this encounter.    Follow-up: No follow-ups on file.  Walker Kehr, MD

## 2018-05-30 DIAGNOSIS — D225 Melanocytic nevi of trunk: Secondary | ICD-10-CM | POA: Diagnosis not present

## 2018-05-30 DIAGNOSIS — D1801 Hemangioma of skin and subcutaneous tissue: Secondary | ICD-10-CM | POA: Diagnosis not present

## 2018-05-30 DIAGNOSIS — Z85820 Personal history of malignant melanoma of skin: Secondary | ICD-10-CM | POA: Diagnosis not present

## 2018-05-30 DIAGNOSIS — L820 Inflamed seborrheic keratosis: Secondary | ICD-10-CM | POA: Diagnosis not present

## 2018-06-07 NOTE — Progress Notes (Addendum)
Subjective:   Oscar [name] is a 78 y.o. male who presents for Medicare Annual/Subsequent preventive examination.  Review of Systems:  No ROS.  Medicare Wellness Visit. Additional risk factors are reflected in the social history.  Cardiac Risk Factors include: advanced age (>16mn, >>78women);hypertension;male gender Sleep patterns: feels rested on waking, gets up 1 times nightly to void and sleeps 7-8 hours nightly.    Home Safety/Smoke Alarms: Feels safe in home. Smoke alarms in place.  Living environment; residence and Firearm Safety: 2-story house, no firearms. Lives with wife, no needs for DME, good support system Seat Belt Safety/Bike Helmet: Wears seat belt.     Objective:    Vitals: BP (!) 158/84   Pulse (!) 56   Resp 17   Ht 6' (1.829 m)   Wt 236 lb (107 kg)   SpO2 98%   BMI 32.01 kg/m   Body mass index is 32.01 kg/m.  Advanced Directives 06/08/2018 04/02/2017 05/23/2015 01/09/2015  Does Patient Have a Medical Advance Directive? Yes Yes Yes Yes  Type of AParamedicof [place] will [place] will [place] will -  Copy of [place]? No - copy requested No - copy requested - Yes    Tobacco Social History   Tobacco Use  Smoking Status Former Smoker  . Packs/day: 1.50  . Years: 20.00  . Pack years: 30.00  . Types: Cigarettes  . Last attempt to quit: 05/25/1978  . Years since quitting: 40.0  Smokeless Tobacco Never Used     Counseling given: Not Answered  Past Medical History:  Diagnosis Date  . Adenomatous colon polyp   . Allergic rhinitis, cause unspecified   . Allergy   . Anxiety state, unspecified   . Barrett esophagus   . BPH (benign prostatic hypertrophy)    BX 2007  . Cancer ([place]    melanoma  . Cataract    bilaterally removed  . Depressive disorder, not elsewhere classified   . GERD (gastroesophageal reflux disease)   . Hypogonadism  male   . Microhematuria   . Neuromuscular disorder ([place]    small [place]1-06-2012 egd   . Other and unspecified hyperlipidemia   . Rosacea   . UTI (lower urinary tract infection) 2009   Past Surgical History:  Procedure Laterality Date  . CATARACT EXTRACTION  05/10/2012   left  . COLONOSCOPY  04-22-2010  . ESOPHAGOGASTRODUODENOSCOPY  05-26-2012   stark  . HAND SURGERY Left   . MELANOMA EXCISION    . POLYPECTOMY     prev colon TA polyps- no polyps in 2011  . PROSTATE BIOPSY  2011   Dr KWinifred Olive   Family History  Problem Relation Age of Onset  . Hypertension Mother   . Cancer Mother        colon ca  . Colon cancer Mother 749 . Heart disease Father        MI  . Coronary artery disease Other   . Stomach cancer Maternal Grandfather   . Colon polyps Neg Hx   . Esophageal cancer Neg Hx   . Rectal cancer Neg Hx    Social History   Socioeconomic History  . Marital status: Married    Spouse name: Not on file  . Number of children: Not on file  . Years of education: Not on file  . Highest education level: Not on file  Occupational History  . Occupation: [employer] -  Retired 2010/june  Social Needs  . Financial resource strain: Not hard at all  . Food insecurity:    Worry: Never true    Inability: Never true  . Transportation needs:    Medical: No    Non-medical: No  Tobacco Use  . Smoking status: Former Smoker    Packs/day: 1.50    Years: 20.00    Pack years: 30.00    Types: Cigarettes    Last attempt to quit: 05/25/1978    Years since quitting: 40.0  . Smokeless tobacco: Never Used  Substance and Sexual Activity  . Alcohol use: Yes    Alcohol/week: 7.0 standard drinks    Types: 7 Standard drinks or equivalent per week    Comment: 2 drinks per day, Kolbee Bogusz and mixed drinks  . Drug use: No  . Sexual activity: Yes  Lifestyle  . Physical activity:    Days per week: 0 days    Minutes per session: 0 min  . Stress: Not at all  Relationships  . Social connections:    Talks  on phone: More than three times a week    Gets together: More than three times a week    Attends religious service: More than 4 times per year    Active member of club or organization: Yes    Attends meetings of clubs or organizations: More than 4 times per year    Relationship status: Married  Other Topics Concern  . Not on file  Social History Narrative   Married - Seperated 2010, reuniting 2012        Outpatient Encounter Medications as of 06/08/2018  Medication Sig  . ARIPiprazole (ABILIFY) 10 MG tablet Take 10 mg by mouth daily.    Marland Kitchen aspirin 325 MG tablet Take 325 mg by mouth daily.  Marland Kitchen atorvastatin (LIPITOR) 40 MG tablet Take 1 tablet (40 mg total) by mouth daily.  . budesonide-formoterol (SYMBICORT) 80-4.5 MCG/ACT inhaler Inhale 2 puffs into the lungs 2 (two) times daily. (Patient taking differently: Inhale 2 puffs into the lungs 2 (two) times daily as needed. )  . Cholecalciferol (RA VITAMIN D-3) 25 MCG (1000 UT) tablet Take 1 tablet (1,000 Units total) by mouth daily.  Marland Kitchen desvenlafaxine (PRISTIQ) 50 MG 24 hr tablet Take 50 mg by mouth daily.  Marland Kitchen losartan (COZAAR) 100 MG tablet Take 1 tablet (100 mg total) by mouth daily.  . multivitamin-lutein (OCUVITE-LUTEIN) CAPS capsule Take 1 capsule daily by mouth.  Marland Kitchen omeprazole (PRILOSEC) 20 MG capsule Take 40 mg by mouth daily.  . sertraline (ZOLOFT) 100 MG tablet Take 100 mg by mouth daily.   . tamsulosin (FLOMAX) 0.4 MG CAPS capsule Take 0.4 mg by mouth daily.   No facility-administered encounter medications on file as of 06/08/2018.     Activities of Daily Living In your present state of health, do you have any difficulty performing the following activities: 06/08/2018  Hearing? N  Vision? N  Difficulty concentrating or making decisions? N  Walking or climbing stairs? N  Dressing or bathing? N  Doing errands, shopping? N  Preparing Food and eating ? N  Using the Toilet? N  In the past six months, have you accidently leaked urine?  N  Do you have problems with loss of bowel control? N  Managing your Medications? N  Managing your Finances? N  Housekeeping or managing your Housekeeping? N  Some recent data might be hidden    Patient Care Team: Plotnikov, Evie Lacks, MD as PCP -  General Chucky May, MD (Psychiatry) Festus Aloe, MD as Attending Physician (Urology) Tanda Rockers, MD as Attending Physician (Pulmonary Disease) Ladene Artist, MD as Attending Physician (Gastroenterology)   Assessment:   This is a routine wellness examination for Oscar [name]. Physical assessment deferred to PCP.  Exercise Activities and Dietary recommendations Current Exercise Habits: The patient does not participate in regular exercise at present, Exercise limited by: orthopedic condition(s)  Diet (meal preparation, eat out, water intake, caffeinated beverages, dairy products, fruits and vegetables): in general, a "healthy" diet  , well balanced   Reviewed heart healthy diet. Encouraged patient to increase daily water and healthy fluid intake.  Goals      Patient Stated   . to remain healthy (pt-stated)     [facility]; continue to engage in social activities; Take care of health ongoing      Other   . Patient Stated     Continue to paint and perhaps check into tai chi classes to improve my balance.       Fall Risk Fall Risk  06/08/2018 04/02/2017 02/26/2016 01/09/2015 08/20/2014  Falls in the past year? 0 No No Yes No  Comment - - - wasn't paying attention; stripped down steps  in front house -  Number falls in past yr: - - - 1 -  Injury with Fall? - - - No -  Risk for fall due to : Impaired balance/gait - - - -  Follow up Falls prevention discussed - - - -    Depression Screen PHQ 2/9 Scores 06/08/2018 04/02/2017 02/26/2016 01/09/2015  PHQ - 2 Score 0 0 0 0  PHQ- 9 Score 0 0 - -    Cognitive Function MMSE - Mini Mental State Exam 06/08/2018 04/02/2017  Orientation to time 5 5  Orientation to Place 5 5    Registration 3 3  Attention/ Calculation 5 5  Recall 3 3  Language- name 2 objects 2 2  Language- repeat 1 1  Language- follow 3 step command 3 3  Language- read & follow direction 1 1  Write a sentence 1 1  Copy design 1 1  Total score 30 30        Immunization History  Administered Date(s) Administered  . Influenza Split 04/09/2011, 03/01/2012  . Influenza Whole 03/19/2009  . Influenza, High Dose Seasonal PF 02/26/2016  . Influenza,inj,Quad PF,6+ Mos 03/16/2013, 02/15/2014, 02/20/2015  . Influenza-Unspecified 03/15/2017, 02/06/2018  . Pneumococcal Conjugate-13 08/15/2013  . Pneumococcal Polysaccharide-23 07/22/2009  . Td 08/20/2014  . Tdap 08/12/2016  . Zoster 02/20/2015    Qualifies for Shingles Vaccine, Shingrix education provided.    Screening Tests Health Maintenance  Topic Date Due  . COLONOSCOPY  06/25/2018  . TETANUS/TDAP  08/13/2026  . INFLUENZA VACCINE  Completed  . PNA vac Low Risk Adult  Completed      Plan:    Reviewed health maintenance screenings with patient today and relevant education, vaccines, and/or referrals were provided.   Continue doing brain stimulating activities (puzzles, reading, adult coloring books, staying active) to keep memory sharp.   Continue to eat heart healthy diet (full of fruits, vegetables, whole grains, lean protein, water--limit salt, fat, and sugar intake) and increase physical activity as tolerated.  I have personally reviewed and noted the following in the patient's chart:   . Medical and social history . Use of alcohol, tobacco or illicit drugs  . Current medications and supplements . Functional ability and status . Nutritional status .  Physical activity . Advanced directives . List of other physicians . Vitals . Screenings to include cognitive, depression, and falls . Referrals and appointments  In addition, I have reviewed and discussed with patient certain preventive protocols, quality metrics, and best  practice recommendations. A written personalized care plan for preventive services as well as general preventive health recommendations were provided to patient.     Michiel Cowboy, RN  06/08/2018  Medical screening examination/treatment/procedure(s) were performed by non-physician practitioner and as supervising physician I was immediately available for consultation/collaboration. I agree with above. Lew Dawes, MD

## 2018-06-08 ENCOUNTER — Ambulatory Visit (INDEPENDENT_AMBULATORY_CARE_PROVIDER_SITE_OTHER): Payer: PPO | Admitting: *Deleted

## 2018-06-08 VITALS — BP 158/84 | HR 56 | Resp 17 | Ht 72.0 in | Wt 236.0 lb

## 2018-06-08 DIAGNOSIS — Z0000 Encounter for general adult medical examination without abnormal findings: Secondary | ICD-10-CM | POA: Diagnosis not present

## 2018-06-08 DIAGNOSIS — Z1211 Encounter for screening for malignant neoplasm of colon: Secondary | ICD-10-CM | POA: Diagnosis not present

## 2018-06-08 DIAGNOSIS — H9193 Unspecified hearing loss, bilateral: Secondary | ICD-10-CM

## 2018-06-08 NOTE — Patient Instructions (Addendum)
Continue doing brain stimulating activities (puzzles, reading, adult coloring books, staying active) to keep memory sharp.   Continue to eat heart healthy diet (full of fruits, vegetables, whole grains, lean protein, water--limit salt, fat, and sugar intake) and increase physical activity as tolerated.   Mr. Oscar [name] , Thank you for taking time to come for your Medicare Wellness Visit. I appreciate your ongoing commitment to your health goals. Please review the following plan we discussed and let me know if I can assist you in the future.   These are the goals we discussed: Goals      Patient Stated   . to remain healthy (pt-stated)     [facility]; continue to engage in social activities; Take care of health ongoing      Other   . Patient Stated     Continue to paint and perhaps check into tai chi classes to improve my balance.       This is a list of the screening recommended for you and due dates:  Health Maintenance  Topic Date Due  . Colon Cancer Screening  06/25/2018  . Tetanus Vaccine  08/13/2026  . Flu Shot  Completed  . Pneumonia vaccines  Completed     Health Maintenance, Male A healthy lifestyle and preventive care is important for your health and wellness. Ask your health care provider about what schedule of regular examinations is right for you. What should I know about weight and diet? Eat a Healthy Diet  Eat plenty of vegetables, fruits, whole grains, low-fat dairy products, and lean protein.  Do not eat a lot of foods high in solid fats, added sugars, or salt.  Maintain a Healthy Weight Regular exercise can help you achieve or maintain a healthy weight. You should:  Do at least 150 minutes of exercise each week. The exercise should increase your heart rate and make you sweat (moderate-intensity exercise).  Do strength-training exercises at least twice a week. Watch Your Levels of Cholesterol and Blood Lipids  Have your blood tested for lipids  and cholesterol every 5 years starting at 77 years of age. If you are at high risk for heart disease, you should start having your blood tested when you are 77 years old. You may need to have your cholesterol levels checked more often if: ? Your lipid or cholesterol levels are high. ? You are older than 77 years of age. ? You are at high risk for heart disease. What should I know about cancer screening? Many types of cancers can be detected early and may often be prevented. Lung Cancer  You should be screened every year for lung cancer if: ? You are a current smoker who has smoked for at least 30 years. ? You are a former smoker who has quit within the past 15 years.  Talk to your health care provider about your screening options, when you should start screening, and how often you should be screened. Colorectal Cancer  Routine colorectal cancer screening usually begins at 77 years of age and should be repeated every 5-10 years until you are 77 years old. You may need to be screened more often if early forms of precancerous polyps or small growths are found. Your health care provider may recommend screening at an earlier age if you have risk factors for colon cancer.  Your health care provider may recommend using home test kits to check for hidden blood in the stool.  A small camera at the  end of a tube can be used to examine your colon (sigmoidoscopy or colonoscopy). This checks for the earliest forms of colorectal cancer. Prostate and Testicular Cancer  Depending on your age and overall health, your health care provider may do certain tests to screen for prostate and testicular cancer.  Talk to your health care provider about any symptoms or concerns you have about testicular or prostate cancer. Skin Cancer  Check your skin from head to toe regularly.  Tell your health care provider about any new moles or changes in moles, especially if: ? There is a change in a mole's size, shape, or  color. ? You have a mole that is larger than a pencil eraser.  Always use sunscreen. Apply sunscreen liberally and repeat throughout the day.  Protect yourself by wearing long sleeves, pants, a wide-brimmed hat, and sunglasses when outside. What should I know about heart disease, diabetes, and high blood pressure?  If you are 81-17 years of age, have your blood pressure checked every 3-5 years. If you are 7 years of age or older, have your blood pressure checked every year. You should have your blood pressure measured twice-once when you are at a hospital or clinic, and once when you are not at a hospital or clinic. Record the average of the two measurements. To check your blood pressure when you are not at a hospital or clinic, you can use: ? An automated blood pressure machine at a pharmacy. ? A home blood pressure monitor.  Talk to your health care provider about your target blood pressure.  If you are between 42-47 years old, ask your health care provider if you should take aspirin to prevent heart disease.  Have regular diabetes screenings by checking your fasting blood sugar level. ? If you are at a normal weight and have a low risk for diabetes, have this test once every three years after the age of 38. ? If you are overweight and have a high risk for diabetes, consider being tested at a younger age or more often.  A one-time screening for abdominal aortic aneurysm (AAA) by ultrasound is recommended for men aged 85-75 years who are current or former smokers. What should I know about preventing infection? Hepatitis B If you have a higher risk for hepatitis B, you should be screened for this virus. Talk with your health care provider to find out if you are at risk for hepatitis B infection. Hepatitis C Blood testing is recommended for:  Everyone born from [birth year] through 1964.  Anyone with known risk factors for hepatitis C. Sexually Transmitted Diseases (STDs)  You should be  screened each year for STDs including gonorrhea and chlamydia if: ? You are sexually active and are younger than 77 years of age. ? You are older than 77 years of age and your health care provider tells you that you are at risk for this type of infection. ? Your sexual activity has changed since you were last screened and you are at an increased risk for chlamydia or gonorrhea. Ask your health care provider if you are at risk.  Talk with your health care provider about whether you are at high risk of being infected with HIV. Your health care provider may recommend a prescription medicine to help prevent HIV infection. What else can I do?  Schedule regular health, dental, and eye exams.  Stay current with your vaccines (immunizations).  Do not use any tobacco products, such as cigarettes, chewing tobacco, and e-cigarettes.  If you need help quitting, ask your health care provider.  Limit alcohol intake to no more than 2 drinks per day. One drink equals 12 ounces of beer, 5 ounces of Raevyn Sokol, or 1 ounces of hard liquor.  Do not use street drugs.  Do not share needles.  Ask your health care provider for help if you need support or information about quitting drugs.  Tell your health care provider if you often feel depressed.  Tell your health care provider if you have ever been abused or do not feel safe at home. This information is not intended to replace advice given to you by your health care provider. Make sure you discuss any questions you have with your health care provider. Document Released: 11/07/2007 Document Revised: 01/08/2016 Document Reviewed: 02/12/2015 Elsevier Interactive Patient Education  2019 [employer].

## 2018-07-26 ENCOUNTER — Encounter: Payer: Self-pay | Admitting: Gastroenterology

## 2018-07-27 ENCOUNTER — Encounter: Payer: Self-pay | Admitting: Gastroenterology

## 2018-08-01 DIAGNOSIS — H903 Sensorineural hearing loss, bilateral: Secondary | ICD-10-CM | POA: Diagnosis not present

## 2018-08-09 ENCOUNTER — Telehealth: Payer: Self-pay | Admitting: *Deleted

## 2018-08-09 NOTE — Telephone Encounter (Signed)
Covid-19 travel screening questions  Have you traveled in the last 14 days? no If yes where?  Do you now or have you had a fever in the last 14 days? no  Do you have any respiratory symptoms of shortness of breath or cough now or in the last 14 days? no  Do you have a medical history of Congestive Heart Failure? no  Do you have a medical history of lung disease? no  Do you have any family members or close contacts with diagnosed or suspected Covid-19? no       

## 2018-08-10 ENCOUNTER — Ambulatory Visit (AMBULATORY_SURGERY_CENTER): Payer: Self-pay | Admitting: *Deleted

## 2018-08-10 ENCOUNTER — Other Ambulatory Visit: Payer: Self-pay

## 2018-08-10 ENCOUNTER — Encounter: Payer: Self-pay | Admitting: Gastroenterology

## 2018-08-10 VITALS — Temp 98.0°F | Ht 73.0 in | Wt 237.0 lb

## 2018-08-10 DIAGNOSIS — Z860100 Personal history of colon polyps, unspecified: Secondary | ICD-10-CM

## 2018-08-10 DIAGNOSIS — K2270 Barrett's esophagus without dysplasia: Secondary | ICD-10-CM

## 2018-08-10 DIAGNOSIS — Z800 Family history of malignant neoplasm of digestive organs: Secondary | ICD-10-CM

## 2018-08-10 NOTE — Progress Notes (Signed)
No egg or soy allergy known to patient  No issues with past sedation with any surgeries  or procedures, no intubation problems  No diet pills per patient No home 02 use per patient  No blood thinners per patient  Pt denies issues with constipation  No A fib or A flutter  EMMI video  Offered and declined by the patient Temp 98

## 2018-08-17 ENCOUNTER — Telehealth: Payer: Self-pay | Admitting: *Deleted

## 2018-08-17 DIAGNOSIS — H903 Sensorineural hearing loss, bilateral: Secondary | ICD-10-CM | POA: Diagnosis not present

## 2018-08-17 NOTE — Telephone Encounter (Signed)
Spoke with patient regarding canceling his procedure on April 1. Pt verbalized understanding. SM

## 2018-08-24 ENCOUNTER — Encounter: Payer: PPO | Admitting: Gastroenterology

## 2018-08-26 ENCOUNTER — Encounter (INDEPENDENT_AMBULATORY_CARE_PROVIDER_SITE_OTHER): Payer: PPO | Admitting: Ophthalmology

## 2018-09-05 DIAGNOSIS — N401 Enlarged prostate with lower urinary tract symptoms: Secondary | ICD-10-CM | POA: Diagnosis not present

## 2018-09-12 DIAGNOSIS — R350 Frequency of micturition: Secondary | ICD-10-CM | POA: Diagnosis not present

## 2018-09-12 DIAGNOSIS — N401 Enlarged prostate with lower urinary tract symptoms: Secondary | ICD-10-CM | POA: Diagnosis not present

## 2018-09-26 ENCOUNTER — Telehealth: Payer: Self-pay | Admitting: *Deleted

## 2018-09-26 NOTE — Telephone Encounter (Signed)
Pt returned call to reschedule ECL previously cancelled. Pt has prep. Instructions redone and mailed to patient.

## 2018-09-26 NOTE — Telephone Encounter (Signed)
Attempted to call patient to reschedule previously canceled ECL due to Covid-19. Unable to reach patient. LM on home phone for patient to return my call, need to verify Rx and redo instructions. VM on cell phone.

## 2018-10-04 DIAGNOSIS — M7731 Calcaneal spur, right foot: Secondary | ICD-10-CM | POA: Diagnosis not present

## 2018-10-04 DIAGNOSIS — M7661 Achilles tendinitis, right leg: Secondary | ICD-10-CM | POA: Diagnosis not present

## 2018-10-11 ENCOUNTER — Telehealth: Payer: Self-pay

## 2018-10-11 NOTE — Telephone Encounter (Signed)
Covid-19 travel screening questions  Have you traveled in the last 14 days? No If yes where?  Do you now or have you had a fever in the last 14 days? No  Do you have any respiratory symptoms of shortness of breath or cough now or in the last 14 days? No  Do you have any family members or close contacts with diagnosed or suspected Covid-19? No       

## 2018-10-13 ENCOUNTER — Other Ambulatory Visit: Payer: Self-pay

## 2018-10-13 ENCOUNTER — Encounter: Payer: Self-pay | Admitting: Gastroenterology

## 2018-10-13 ENCOUNTER — Ambulatory Visit (AMBULATORY_SURGERY_CENTER): Payer: PPO | Admitting: Gastroenterology

## 2018-10-13 VITALS — BP 130/65 | HR 46 | Temp 98.8°F | Resp 11 | Ht 73.0 in | Wt 237.0 lb

## 2018-10-13 DIAGNOSIS — Z86010 Personal history of colonic polyps: Secondary | ICD-10-CM

## 2018-10-13 DIAGNOSIS — K5710 Diverticulosis of small intestine without perforation or abscess without bleeding: Secondary | ICD-10-CM | POA: Diagnosis not present

## 2018-10-13 DIAGNOSIS — D124 Benign neoplasm of descending colon: Secondary | ICD-10-CM | POA: Diagnosis not present

## 2018-10-13 DIAGNOSIS — K449 Diaphragmatic hernia without obstruction or gangrene: Secondary | ICD-10-CM

## 2018-10-13 DIAGNOSIS — K317 Polyp of stomach and duodenum: Secondary | ICD-10-CM

## 2018-10-13 DIAGNOSIS — D125 Benign neoplasm of sigmoid colon: Secondary | ICD-10-CM

## 2018-10-13 DIAGNOSIS — K2270 Barrett's esophagus without dysplasia: Secondary | ICD-10-CM | POA: Diagnosis not present

## 2018-10-13 DIAGNOSIS — I10 Essential (primary) hypertension: Secondary | ICD-10-CM | POA: Diagnosis not present

## 2018-10-13 DIAGNOSIS — Z800 Family history of malignant neoplasm of digestive organs: Secondary | ICD-10-CM | POA: Diagnosis not present

## 2018-10-13 DIAGNOSIS — K635 Polyp of colon: Secondary | ICD-10-CM

## 2018-10-13 DIAGNOSIS — J45909 Unspecified asthma, uncomplicated: Secondary | ICD-10-CM | POA: Diagnosis not present

## 2018-10-13 MED ORDER — SODIUM CHLORIDE 0.9 % IV SOLN: 500.0000 mL | Freq: Once | INTRAVENOUS | Status: DC

## 2018-10-13 NOTE — Patient Instructions (Signed)
Handout on polyps, diverticulosis, high fiber diet and hemorrhoids. Continue present medications.   YOU HAD AN ENDOSCOPIC PROCEDURE TODAY AT [facility]:   Refer to the procedure report that was given to you for any specific questions about what was found during the examination.  If the procedure report does not answer your questions, please call your gastroenterologist to clarify.  If you requested that your care partner not be given the details of your procedure findings, then the procedure report has been included in a sealed envelope for you to review at your convenience later.  YOU SHOULD EXPECT: Some feelings of bloating in the abdomen. Passage of more gas than usual.  Walking can help get rid of the air that was put into your GI tract during the procedure and reduce the bloating. If you had a lower endoscopy (such as a colonoscopy or flexible sigmoidoscopy) you may notice spotting of blood in your stool or on the toilet paper. If you underwent a bowel prep for your procedure, you may not have a normal bowel movement for a few days.  Please Note:  You might notice some irritation and congestion in your nose or some drainage.  This is from the oxygen used during your procedure.  There is no need for concern and it should clear up in a day or so.  SYMPTOMS TO REPORT IMMEDIATELY:   Following lower endoscopy (colonoscopy or flexible sigmoidoscopy):  Excessive amounts of blood in the stool  Significant tenderness or worsening of abdominal pains  Swelling of the abdomen that is new, acute  Fever of 100F or higher   Following upper endoscopy (EGD)  Vomiting of blood or coffee ground material  New chest pain or pain under the shoulder blades  Painful or persistently difficult swallowing  New shortness of breath  Fever of 100F or higher  Black, tarry-looking stools  For urgent or emergent issues, a gastroenterologist can be reached at any hour by calling [phone]  [phone].   DIET:  We do recommend a small meal at first, but then you may proceed to your regular diet.  Drink plenty of fluids but you should avoid alcoholic beverages for 24 hours.  ACTIVITY:  You should plan to take it easy for the rest of today and you should NOT DRIVE or use heavy machinery until tomorrow (because of the sedation medicines used during the test).    FOLLOW UP: Our staff will call the number listed on your records 48-72 hours following your procedure to check on you and address any questions or concerns that you may have regarding the information given to you following your procedure. If we do not reach you, we will leave a message.  We will attempt to reach you two times.  During this call, we will ask if you have developed any symptoms of COVID 19. If you develop any symptoms (for example fever, flu-like symptoms, shortness of breath, cough etc.) before then, please call [phone].  If any biopsies were taken you will be contacted by phone or by letter within the next 1-3 weeks.  Please call us at [phone] if you have not heard about the biopsies in 3 weeks.    SIGNATURES/CONFIDENTIALITY: You and/or your care partner have signed paperwork which will be entered into your electronic medical record.  These signatures attest to the fact that that the information above on your After Visit Summary has been reviewed and is understood.  Full responsibility of the confidentiality of this  discharge information lies with you and/or your care-partner. 

## 2018-10-13 NOTE — Progress Notes (Signed)
Called to room to assist during endoscopic procedure.  Patient ID and intended procedure confirmed with present staff. Received instructions for my participation in the procedure from the performing physician.  

## 2018-10-13 NOTE — Progress Notes (Signed)
Pt's states no medical or surgical changes since previsit or office visit.  [place]

## 2018-10-13 NOTE — Progress Notes (Signed)
Report given to PACU, vss 

## 2018-10-13 NOTE — Op Note (Signed)
[facility] Patient Name: Oscar [name] Procedure Date: 10/13/2018 11:31 AM MRN: [id number] Endoscopist: Ladene Artist , MD Age: 78 Referring MD:  Date of Birth: [birth year] Gender: Male Account #: [id number] Procedure:                Colonoscopy Indications:              Surveillance: Personal history of adenomatous                            polyps on last colonoscopy 3 years ago. Family                            history of colon cancer, first degree relative. Medicines:                Monitored Anesthesia Care Procedure:                Pre-Anesthesia Assessment:                           - Prior to the procedure, a History and Physical                            was performed, and patient medications and                            allergies were reviewed. The patient's tolerance of                            previous anesthesia was also reviewed. The risks                            and benefits of the procedure and the sedation                            options and risks were discussed with the patient.                            All questions were answered, and informed consent                            was obtained. Prior Anticoagulants: The patient has                            taken no previous anticoagulant or antiplatelet                            agents. ASA Grade Assessment: II - A patient with                            mild systemic disease. After reviewing the risks                            and benefits, the patient was deemed in  satisfactory condition to undergo the procedure.                           After obtaining informed consent, the colonoscope                            was passed under direct vision. Throughout the                            procedure, the patient's blood pressure, pulse, and                            oxygen saturations were monitored continuously. The                            Colonoscope was  introduced through the anus and                            advanced to the the cecum, identified by                            appendiceal orifice and ileocecal valve. The                            ileocecal valve, appendiceal orifice, and rectum                            were photographed. The quality of the bowel                            preparation was good. The colonoscopy was performed                            without difficulty. The patient tolerated the                            procedure well. Scope In: 11:43:29 AM Scope Out: 12:00:36 PM Scope Withdrawal Time: 0 hours 12 minutes 1 second  Total Procedure Duration: 0 hours 17 minutes 7 seconds  Findings:                 The perianal and digital rectal examinations were                            normal.                           Three sessile polyps were found in the sigmoid                            colon (2) and descending colon (1). The polyps were                            5 to 7 mm in size. These polyps were removed with a  cold snare. Resection and retrieval were complete.                            The 2 sigmoid polyps were side by side and were                            removed in one block.                           Multiple small-mouthed diverticula were found in                            the left colon. There was no evidence of                            diverticular bleeding.                           Internal hemorrhoids were found during                            retroflexion. The hemorrhoids were small and Grade                            I (internal hemorrhoids that do not prolapse).                           The exam was otherwise without abnormality on                            direct and retroflexion views. Complications:            No immediate complications. Estimated blood loss:                            None. Estimated Blood Loss:     Estimated blood loss:  none. Impression:               - Three 5 to 7 mm polyps in the sigmoid colon and                            in the descending colon, removed with a cold snare.                            Resected and retrieved.                           - Mild diverticulosis in the left colon.                           - Internal hemorrhoids.                           - The examination was otherwise normal on direct  and retroflexion views. Recommendation:           - Repeat colonoscopy in 3 - 5 years for                            surveillance pending pathology review.                           - Patient has a contact number available for                            emergencies. The signs and symptoms of potential                            delayed complications were discussed with the                            patient. Return to normal activities tomorrow.                            Written discharge instructions were provided to the                            patient.                           - High fiber diet.                           - Continue present medications.                           - Await pathology results. Ladene Artist, MD 10/13/2018 12:18:01 PM This report has been signed electronically.

## 2018-10-13 NOTE — Op Note (Signed)
[facility] Patient Name: Oscar [name] Procedure Date: 10/13/2018 11:30 AM MRN: [id number] Endoscopist: Ladene Artist , MD Age: 78 Referring MD:  Date of Birth: 1940/09/15 Gender: Male Account #: [id number] Procedure:                Upper GI endoscopy Indications:              Surveillance for malignancy due to personal history                            of Barrett's esophagus Medicines:                Monitored Anesthesia Care Procedure:                Pre-Anesthesia Assessment:                           - Prior to the procedure, a History and Physical                            was performed, and patient medications and                            allergies were reviewed. The patient's tolerance of                            previous anesthesia was also reviewed. The risks                            and benefits of the procedure and the sedation                            options and risks were discussed with the patient.                            All questions were answered, and informed consent                            was obtained. Prior Anticoagulants: The patient has                            taken no previous anticoagulant or antiplatelet                            agents. ASA Grade Assessment: II - A patient with                            mild systemic disease. After reviewing the risks                            and benefits, the patient was deemed in                            satisfactory condition to undergo the procedure.  After obtaining informed consent, the endoscope was                            passed under direct vision. Throughout the                            procedure, the patient's blood pressure, pulse, and                            oxygen saturations were monitored continuously. The                            Model GIF-[id number]) scope was introduced                            through the mouth, and  advanced to the second part                            of duodenum. The upper GI endoscopy was                            accomplished without difficulty. The patient                            tolerated the procedure well. Scope In: Scope Out: Findings:                 There were esophageal mucosal changes secondary to                            established long-segment Barrett's disease present                            in the distal esophagus. The maximum longitudinal                            extent of these mucosal changes was 4 cm in length.                            Mucosa was biopsied with a cold forceps for                            histology in 4 quadrants at intervals of 1 cm in                            the lower third of the esophagus. One specimen                            bottle was sent to pathology.                           The exam of the esophagus was otherwise normal.  Diffuse mildly erythematous mucosa without bleeding                            was found in the gastric fundus and in the gastric                            body. Biopsies were taken with a cold forceps for                            histology.                           A medium-sized hiatal hernia was present.                           The exam of the stomach was otherwise normal.                           A medium non-bleeding diverticulum was found at the                            major papilla.                           The exam of the duodenum was otherwise normal. Complications:            No immediate complications. Impression:               - Esophageal mucosal changes secondary to                            established long-segment Barrett's disease.                            Biopsied.                           - Erythematous mucosa in the gastric fundus and                            gastric body. Biopsied.                           - Medium-sized hiatal  hernia.                           - Non-bleeding duodenal diverticulum. Recommendation:           - Patient has a contact number available for                            emergencies. The signs and symptoms of potential                            delayed complications were discussed with the  patient. Return to normal activities tomorrow.                            Written discharge instructions were provided to the                            patient.                           - Resume previous diet.                           - Antireflux measures long term.                           - Continue present medications.                           - Await pathology results.                           - Repeat upper endoscopy in 3 years for                            surveillance of Barrett's esophagus if no dysplasia. Ladene Artist, MD 10/13/2018 12:22:29 PM This report has been signed electronically.

## 2018-10-15 ENCOUNTER — Telehealth: Payer: Self-pay | Admitting: *Deleted

## 2018-10-15 NOTE — Telephone Encounter (Signed)
No answer for first follow up call will call back. SM

## 2018-10-16 ENCOUNTER — Telehealth: Payer: Self-pay | Admitting: *Deleted

## 2018-10-16 NOTE — Telephone Encounter (Signed)
No answer for second post procedure follow up call unable to leave message. SM

## 2018-10-19 DIAGNOSIS — M7661 Achilles tendinitis, right leg: Secondary | ICD-10-CM | POA: Diagnosis not present

## 2018-10-27 ENCOUNTER — Encounter: Payer: Self-pay | Admitting: Gastroenterology

## 2018-10-27 DIAGNOSIS — M7661 Achilles tendinitis, right leg: Secondary | ICD-10-CM | POA: Diagnosis not present

## 2018-11-09 ENCOUNTER — Other Ambulatory Visit (INDEPENDENT_AMBULATORY_CARE_PROVIDER_SITE_OTHER): Payer: PPO

## 2018-11-09 ENCOUNTER — Encounter: Payer: Self-pay | Admitting: Internal Medicine

## 2018-11-09 ENCOUNTER — Ambulatory Visit (INDEPENDENT_AMBULATORY_CARE_PROVIDER_SITE_OTHER): Payer: PPO | Admitting: Internal Medicine

## 2018-11-09 ENCOUNTER — Other Ambulatory Visit: Payer: Self-pay

## 2018-11-09 DIAGNOSIS — R0609 Other forms of dyspnea: Secondary | ICD-10-CM | POA: Diagnosis not present

## 2018-11-09 DIAGNOSIS — E785 Hyperlipidemia, unspecified: Secondary | ICD-10-CM | POA: Diagnosis not present

## 2018-11-09 DIAGNOSIS — I10 Essential (primary) hypertension: Secondary | ICD-10-CM

## 2018-11-09 DIAGNOSIS — M7661 Achilles tendinitis, right leg: Secondary | ICD-10-CM | POA: Diagnosis not present

## 2018-11-09 DIAGNOSIS — E291 Testicular hypofunction: Secondary | ICD-10-CM | POA: Diagnosis not present

## 2018-11-09 LAB — BASIC METABOLIC PANEL
BUN: 15 mg/dL (ref 6–23)
CO2: 27 mEq/L (ref 19–32)
Calcium: 9.4 mg/dL (ref 8.4–10.5)
Chloride: 102 mEq/L (ref 96–112)
Creatinine, Ser: 0.87 mg/dL (ref 0.40–1.50)
GFR: 85.05 mL/min (ref >60.00)
Glucose, Bld: 97 mg/dL (ref 70–99)
Potassium: 4.8 mEq/L (ref 3.5–5.1)
Sodium: 138 mEq/L (ref 135–145)

## 2018-11-09 NOTE — Assessment & Plan Note (Signed)
Cont w/wt loss 

## 2018-11-09 NOTE — Progress Notes (Signed)
Subjective:  Patient ID: Oscar [name], male    DOB: 12-Feb-1941  Age: 78 y.o. MRN: [id number]  CC: No chief complaint on file.   HPI Sasha Rueth presents for HTN, obesity, dyslipidemia f/u. BP ok at home  Outpatient Medications Prior to Visit  Medication Sig Dispense Refill  . ARIPiprazole (ABILIFY) 10 MG tablet Take 10 mg by mouth daily.      Marland Kitchen aspirin 325 MG tablet Take 325 mg by mouth daily.    Marland Kitchen atorvastatin (LIPITOR) 40 MG tablet Take 1 tablet (40 mg total) by mouth daily. 90 tablet 3  . Cholecalciferol (RA VITAMIN D-3) 25 MCG (1000 UT) tablet Take 1 tablet (1,000 Units total) by mouth daily. 90 tablet 3  . desvenlafaxine (PRISTIQ) 100 MG 24 hr tablet     . dutasteride (AVODART) 0.5 MG capsule TK 1 C PO QD    . famotidine-calcium carbonate-magnesium hydroxide (PEPCID COMPLETE) 10-800-165 MG chewable tablet Chew 1 tablet by mouth daily as needed.    Marland Kitchen losartan (COZAAR) 100 MG tablet Take 1 tablet (100 mg total) by mouth daily. 90 tablet 3  . multivitamin-lutein (OCUVITE-LUTEIN) CAPS capsule Take 1 capsule daily by mouth.    Marland Kitchen omeprazole (PRILOSEC) 20 MG capsule Take 40 mg by mouth daily.    . sertraline (ZOLOFT) 100 MG tablet Take 100 mg by mouth daily.     . tamsulosin (FLOMAX) 0.4 MG CAPS capsule Take 0.4 mg by mouth daily.     No facility-administered medications prior to visit.     ROS: Review of Systems  Constitutional: Positive for unexpected weight change. Negative for appetite change and fatigue.  HENT: Negative for congestion, nosebleeds, sneezing, sore throat and trouble swallowing.   Eyes: Negative for itching and visual disturbance.  Respiratory: Positive for shortness of breath. Negative for cough.   Cardiovascular: Negative for chest pain, palpitations and leg swelling.  Gastrointestinal: Negative for abdominal distention, blood in stool, diarrhea and nausea.  Genitourinary: Negative for frequency and hematuria.  Musculoskeletal: Negative for back pain, gait  problem, joint swelling and neck pain.  Skin: Negative for rash.  Neurological: Negative for dizziness, tremors, speech difficulty and weakness.  Psychiatric/Behavioral: Negative for agitation, dysphoric mood, sleep disturbance and suicidal ideas. The patient is not nervous/anxious.     Objective:  BP (!) 146/80 (BP Location: Left Arm, Patient Position: Sitting, Cuff Size: Large)   Pulse (!) 57   Temp 98 F (36.7 C) (Oral)   Ht 6\' 1"  (1.854 m)   Wt 237 lb (107.5 kg)   SpO2 98%   BMI 31.27 kg/m   BP Readings from Last 3 Encounters:  11/09/18 (!) 146/80  10/13/18 130/65  06/08/18 (!) 158/84    Wt Readings from Last 3 Encounters:  11/09/18 237 lb (107.5 kg)  10/13/18 237 lb (107.5 kg)  08/10/18 237 lb (107.5 kg)    Physical Exam Constitutional:      General: He is not in acute distress.    Appearance: He is well-developed.     Comments: NAD  Eyes:     Conjunctiva/sclera: Conjunctivae normal.     Pupils: Pupils are equal, round, and reactive to light.  Neck:     Musculoskeletal: Normal range of motion.     Thyroid: No thyromegaly.     Vascular: No JVD.  Cardiovascular:     Rate and Rhythm: Normal rate and regular rhythm.     Heart sounds: Normal heart sounds. No murmur. No friction rub. No gallop.   Pulmonary:  Effort: Pulmonary effort is normal. No respiratory distress.     Breath sounds: Normal breath sounds. No wheezing or rales.  Chest:     Chest wall: No tenderness.  Abdominal:     General: Bowel sounds are normal. There is no distension.     Palpations: Abdomen is soft. There is no mass.     Tenderness: There is no abdominal tenderness. There is no guarding or rebound.  Musculoskeletal: Normal range of motion.        General: No tenderness.  Lymphadenopathy:     Cervical: No cervical adenopathy.  Skin:    General: Skin is warm and dry.     Findings: No rash.  Neurological:     Mental Status: He is alert and oriented to person, place, and time.      Cranial Nerves: No cranial nerve deficit.     Motor: No abnormal muscle tone.     Coordination: Coordination normal.     Gait: Gait normal.     Deep Tendon Reflexes: Reflexes are normal and symmetric.  Psychiatric:        Behavior: Behavior normal.        Thought Content: Thought content normal.        Judgment: Judgment normal.   obese LS stiff  Lab Results  Component Value Date   WBC 9.4 05/12/2018   HGB 14.4 05/12/2018   HCT 42.7 05/12/2018   PLT 231.0 05/12/2018   GLUCOSE 102 (H) 05/12/2018   CHOL 181 11/10/2017   TRIG 172.0 (H) 11/10/2017   HDL 67.20 11/10/2017   LDLDIRECT 99.0 06/04/2017   LDLCALC 80 11/10/2017   ALT 23 06/04/2017   AST 19 06/04/2017   NA 141 05/12/2018   K 5.0 05/12/2018   CL 106 05/12/2018   CREATININE 0.85 05/12/2018   BUN 15 05/12/2018   CO2 27 05/12/2018   TSH 1.71 05/12/2018   PSA 4.29 (H) 02/26/2016   HGBA1C 5.6 03/22/2014    Dg Chest 2 View  Result Date: 02/26/2016 CLINICAL DATA:  Dyspnea on exertion for the past year. History of hypertension. EXAM: CHEST  2 VIEW COMPARISON:  02/16/2012 FINDINGS: Normal cardiac silhouette and mediastinal contours with atherosclerotic plaque within the thoracic aorta. The lungs appear mildly hyperexpanded with flattening the bilaterally diaphragms. Minimal left basilar linear heterogeneous opacities favored to represent atelectasis or scar. No new focal airspace opacities. No pleural effusion or pneumothorax. No evidence of edema. No acute osseous abnormalities. IMPRESSION: 1. Minimal left basilar atelectasis / scar without acute cardiopulmonary disease. 2. Mild lung hyperexpansion.  Emphysema. (ICD10-J43.9) 3.  Aortic Atherosclerosis (ICD10-170.0) Electronically Signed   By: Sandi Mariscal M.D.   On: 02/26/2016 09:35    Assessment & Plan:   There are no diagnoses linked to this encounter.   No orders of the defined types were placed in this encounter.    Follow-up: No follow-ups on file.  Walker Kehr, MD

## 2018-11-09 NOTE — Assessment & Plan Note (Signed)
Losartan 

## 2018-11-09 NOTE — Assessment & Plan Note (Signed)
Labs

## 2018-11-09 NOTE — Assessment & Plan Note (Signed)
Off testosterone x long time

## 2018-11-09 NOTE — Assessment & Plan Note (Signed)
Chronic irrad to L hip 2012-13 - s/p Ortho eval

## 2019-01-02 ENCOUNTER — Encounter (INDEPENDENT_AMBULATORY_CARE_PROVIDER_SITE_OTHER): Payer: PPO | Admitting: Ophthalmology

## 2019-01-18 ENCOUNTER — Encounter (INDEPENDENT_AMBULATORY_CARE_PROVIDER_SITE_OTHER): Payer: PPO | Admitting: Ophthalmology

## 2019-01-18 ENCOUNTER — Other Ambulatory Visit: Payer: Self-pay

## 2019-01-18 DIAGNOSIS — H33301 Unspecified retinal break, right eye: Secondary | ICD-10-CM | POA: Diagnosis not present

## 2019-01-18 DIAGNOSIS — H35033 Hypertensive retinopathy, bilateral: Secondary | ICD-10-CM | POA: Diagnosis not present

## 2019-01-18 DIAGNOSIS — H353132 Nonexudative age-related macular degeneration, bilateral, intermediate dry stage: Secondary | ICD-10-CM

## 2019-01-18 DIAGNOSIS — H43813 Vitreous degeneration, bilateral: Secondary | ICD-10-CM | POA: Diagnosis not present

## 2019-01-18 DIAGNOSIS — I10 Essential (primary) hypertension: Secondary | ICD-10-CM | POA: Diagnosis not present

## 2019-02-24 DIAGNOSIS — L720 Epidermal cyst: Secondary | ICD-10-CM | POA: Diagnosis not present

## 2019-02-24 DIAGNOSIS — L738 Other specified follicular disorders: Secondary | ICD-10-CM | POA: Diagnosis not present

## 2019-02-24 DIAGNOSIS — L570 Actinic keratosis: Secondary | ICD-10-CM | POA: Diagnosis not present

## 2019-02-24 DIAGNOSIS — L821 Other seborrheic keratosis: Secondary | ICD-10-CM | POA: Diagnosis not present

## 2019-02-24 DIAGNOSIS — D1801 Hemangioma of skin and subcutaneous tissue: Secondary | ICD-10-CM | POA: Diagnosis not present

## 2019-02-24 DIAGNOSIS — Z85820 Personal history of malignant melanoma of skin: Secondary | ICD-10-CM | POA: Diagnosis not present

## 2019-03-20 DIAGNOSIS — M67961 Unspecified disorder of synovium and tendon, right lower leg: Secondary | ICD-10-CM | POA: Diagnosis not present

## 2019-03-29 DIAGNOSIS — M67961 Unspecified disorder of synovium and tendon, right lower leg: Secondary | ICD-10-CM | POA: Diagnosis not present

## 2019-04-05 DIAGNOSIS — M67961 Unspecified disorder of synovium and tendon, right lower leg: Secondary | ICD-10-CM | POA: Diagnosis not present

## 2019-04-10 DIAGNOSIS — M67961 Unspecified disorder of synovium and tendon, right lower leg: Secondary | ICD-10-CM | POA: Diagnosis not present

## 2019-04-12 DIAGNOSIS — M67961 Unspecified disorder of synovium and tendon, right lower leg: Secondary | ICD-10-CM | POA: Diagnosis not present

## 2019-04-17 DIAGNOSIS — M67961 Unspecified disorder of synovium and tendon, right lower leg: Secondary | ICD-10-CM | POA: Diagnosis not present

## 2019-04-24 DIAGNOSIS — M67961 Unspecified disorder of synovium and tendon, right lower leg: Secondary | ICD-10-CM | POA: Diagnosis not present

## 2019-04-28 DIAGNOSIS — M67961 Unspecified disorder of synovium and tendon, right lower leg: Secondary | ICD-10-CM | POA: Diagnosis not present

## 2019-05-01 DIAGNOSIS — M67969 Unspecified disorder of synovium and tendon, unspecified lower leg: Secondary | ICD-10-CM | POA: Diagnosis not present

## 2019-05-09 ENCOUNTER — Other Ambulatory Visit: Payer: Self-pay | Admitting: Internal Medicine

## 2019-05-11 ENCOUNTER — Encounter: Payer: Self-pay | Admitting: Internal Medicine

## 2019-05-11 ENCOUNTER — Other Ambulatory Visit: Payer: Self-pay

## 2019-05-11 ENCOUNTER — Other Ambulatory Visit (INDEPENDENT_AMBULATORY_CARE_PROVIDER_SITE_OTHER): Payer: PPO

## 2019-05-11 ENCOUNTER — Ambulatory Visit (INDEPENDENT_AMBULATORY_CARE_PROVIDER_SITE_OTHER): Payer: PPO | Admitting: Internal Medicine

## 2019-05-11 VITALS — BP 140/76 | HR 49 | Temp 98.4°F | Ht 73.0 in | Wt 240.0 lb

## 2019-05-11 DIAGNOSIS — E785 Hyperlipidemia, unspecified: Secondary | ICD-10-CM

## 2019-05-11 DIAGNOSIS — R739 Hyperglycemia, unspecified: Secondary | ICD-10-CM | POA: Diagnosis not present

## 2019-05-11 DIAGNOSIS — I10 Essential (primary) hypertension: Secondary | ICD-10-CM | POA: Diagnosis not present

## 2019-05-11 DIAGNOSIS — K2270 Barrett's esophagus without dysplasia: Secondary | ICD-10-CM

## 2019-05-11 DIAGNOSIS — R5383 Other fatigue: Secondary | ICD-10-CM

## 2019-05-11 DIAGNOSIS — R202 Paresthesia of skin: Secondary | ICD-10-CM

## 2019-05-11 DIAGNOSIS — R635 Abnormal weight gain: Secondary | ICD-10-CM | POA: Diagnosis not present

## 2019-05-11 DIAGNOSIS — R110 Nausea: Secondary | ICD-10-CM

## 2019-05-11 LAB — CBC WITH DIFFERENTIAL/PLATELET
Basophils Absolute: 0.1 10*3/uL (ref 0.0–0.1)
Basophils Relative: 0.8 % (ref 0.0–3.0)
Eosinophils Absolute: 0.3 10*3/uL (ref 0.0–0.7)
Eosinophils Relative: 2.9 % (ref 0.0–5.0)
HCT: 41.1 % (ref 39.0–52.0)
Hemoglobin: 13.9 g/dL (ref 13.0–17.0)
Lymphocytes Relative: 24.7 % (ref 12.0–46.0)
Lymphs Abs: 2.5 10*3/uL (ref 0.7–4.0)
MCHC: 33.8 g/dL (ref 30.0–36.0)
MCV: 87.9 fl (ref 78.0–100.0)
Monocytes Absolute: 0.8 10*3/uL (ref 0.1–1.0)
Monocytes Relative: 7.7 % (ref 3.0–12.0)
Neutro Abs: 6.4 10*3/uL (ref 1.4–7.7)
Neutrophils Relative %: 63.9 % (ref 43.0–77.0)
Platelets: 232 10*3/uL (ref 150.0–400.0)
RBC: 4.68 Mil/uL (ref 4.22–5.81)
RDW: 13.9 % (ref 11.5–15.5)
WBC: 10 10*3/uL (ref 4.0–10.5)

## 2019-05-11 LAB — HEPATIC FUNCTION PANEL
ALT: 27 U/L (ref 0–53)
AST: 20 U/L (ref 0–37)
Albumin: 4.3 g/dL (ref 3.5–5.2)
Alkaline Phosphatase: 69 U/L (ref 39–117)
Bilirubin, Direct: 0.1 mg/dL (ref 0.0–0.3)
Total Bilirubin: 0.6 mg/dL (ref 0.2–1.2)
Total Protein: 6.9 g/dL (ref 6.0–8.3)

## 2019-05-11 LAB — LDL CHOLESTEROL, DIRECT: Direct LDL: 87 mg/dL

## 2019-05-11 LAB — BASIC METABOLIC PANEL
BUN: 12 mg/dL (ref 6–23)
CO2: 27 mEq/L (ref 19–32)
Calcium: 9 mg/dL (ref 8.4–10.5)
Chloride: 100 mEq/L (ref 96–112)
Creatinine, Ser: 0.89 mg/dL (ref 0.40–1.50)
GFR: 82.74 mL/min (ref >60.00)
Glucose, Bld: 101 mg/dL — ABNORMAL HIGH (ref 70–99)
Potassium: 4.4 mEq/L (ref 3.5–5.1)
Sodium: 135 mEq/L (ref 135–145)

## 2019-05-11 LAB — LIPID PANEL
Cholesterol: 182 mg/dL (ref 0–200)
HDL: 55.9 mg/dL (ref >39.00)
NonHDL: 126.21
Total CHOL/HDL Ratio: 3
Triglycerides: 351 mg/dL — ABNORMAL HIGH (ref 0.0–149.0)
VLDL: 70.2 mg/dL — ABNORMAL HIGH (ref 0.0–40.0)

## 2019-05-11 LAB — HEMOGLOBIN A1C: Hgb A1c MFr Bld: 5.8 % (ref 4.6–6.5)

## 2019-05-11 LAB — VITAMIN B12: Vitamin B-12: 355 pg/mL (ref 211–911)

## 2019-05-11 LAB — T4, FREE: Free T4: 0.8 ng/dL (ref 0.60–1.60)

## 2019-05-11 LAB — TSH: TSH: 2.16 u[IU]/mL (ref 0.35–4.50)

## 2019-05-11 MED ORDER — ATORVASTATIN CALCIUM 40 MG PO TABS: 40.0000 mg | ORAL_TABLET | Freq: Every day | ORAL | 3 refills | Status: DC

## 2019-05-11 NOTE — Assessment & Plan Note (Signed)
EGD 2020 q 3 years Prilosec

## 2019-05-11 NOTE — Assessment & Plan Note (Signed)
Labs Diet

## 2019-05-11 NOTE — Patient Instructions (Signed)

## 2019-05-11 NOTE — Progress Notes (Signed)
Subjective:  Patient ID: Oscar [name], male    DOB: Sep 05, 1940  Age: 78 y.o. MRN: [id number]  CC: No chief complaint on file.   HPI Oscar [name] presents for HTN, dyslipidemia, depression f/u. C/o fatigue at night C/o nausea x 2 years  Outpatient Medications Prior to Visit  Medication Sig Dispense Refill  . ARIPiprazole (ABILIFY) 10 MG tablet Take 10 mg by mouth daily.      Marland Kitchen aspirin 325 MG tablet Take 325 mg by mouth daily.    Marland Kitchen atorvastatin (LIPITOR) 40 MG tablet Take 1 tablet (40 mg total) by mouth daily. 90 tablet 3  . Cholecalciferol (RA VITAMIN D-3) 25 MCG (1000 UT) tablet Take 1 tablet (1,000 Units total) by mouth daily. 90 tablet 3  . desvenlafaxine (PRISTIQ) 100 MG 24 hr tablet     . dutasteride (AVODART) 0.5 MG capsule TK 1 C PO QD    . famotidine-calcium carbonate-magnesium hydroxide (PEPCID COMPLETE) 10-800-165 MG chewable tablet Chew 1 tablet by mouth daily as needed.    Marland Kitchen losartan (COZAAR) 100 MG tablet TAKE 1 TABLET(100 MG) BY MOUTH DAILY 90 tablet 3  . multivitamin-lutein (OCUVITE-LUTEIN) CAPS capsule Take 1 capsule daily by mouth.    Marland Kitchen omeprazole (PRILOSEC) 20 MG capsule Take 40 mg by mouth daily.    . sertraline (ZOLOFT) 100 MG tablet Take 100 mg by mouth daily.     . tamsulosin (FLOMAX) 0.4 MG CAPS capsule Take 0.4 mg by mouth daily.     No facility-administered medications prior to visit.    ROS: Review of Systems  Constitutional: Positive for fatigue and unexpected weight change. Negative for appetite change.  HENT: Negative for congestion, nosebleeds, sneezing, sore throat and trouble swallowing.   Eyes: Negative for itching and visual disturbance.  Respiratory: Negative for cough.   Cardiovascular: Negative for chest pain, palpitations and leg swelling.  Gastrointestinal: Negative for abdominal distention, blood in stool, diarrhea and nausea.  Genitourinary: Negative for frequency and hematuria.  Musculoskeletal: Negative for back pain, gait problem,  joint swelling and neck pain.  Skin: Negative for rash.  Neurological: Negative for dizziness, tremors, speech difficulty and weakness.  Psychiatric/Behavioral: Positive for dysphoric mood. Negative for agitation, sleep disturbance and suicidal ideas. The patient is nervous/anxious.     Objective:  BP 140/76 (BP Location: Left Arm, Patient Position: Sitting, Cuff Size: Large)   Pulse (!) 49   Temp 98.4 F (36.9 C) (Oral)   Ht 6\' 1"  (1.854 m)   Wt 240 lb (108.9 kg)   SpO2 96%   BMI 31.66 kg/m   BP Readings from Last 3 Encounters:  05/11/19 140/76  11/09/18 (!) 146/80  10/13/18 130/65    Wt Readings from Last 3 Encounters:  05/11/19 240 lb (108.9 kg)  11/09/18 237 lb (107.5 kg)  10/13/18 237 lb (107.5 kg)    Physical Exam Constitutional:      General: He is not in acute distress.    Appearance: He is well-developed. He is obese.     Comments: NAD  Eyes:     Conjunctiva/sclera: Conjunctivae normal.     Pupils: Pupils are equal, round, and reactive to light.  Neck:     Thyroid: No thyromegaly.     Vascular: No JVD.  Cardiovascular:     Rate and Rhythm: Normal rate and regular rhythm.     Heart sounds: Normal heart sounds. No murmur. No friction rub. No gallop.   Pulmonary:     Effort: Pulmonary effort is normal. No  respiratory distress.     Breath sounds: Normal breath sounds. No wheezing or rales.  Chest:     Chest wall: No tenderness.  Abdominal:     General: Bowel sounds are normal. There is no distension.     Palpations: Abdomen is soft. There is no mass.     Tenderness: There is no abdominal tenderness. There is no guarding or rebound.  Musculoskeletal:        General: No tenderness. Normal range of motion.     Cervical back: Normal range of motion.  Lymphadenopathy:     Cervical: No cervical adenopathy.  Skin:    General: Skin is warm and dry.     Findings: No rash.  Neurological:     Mental Status: He is alert and oriented to person, place, and time.      Cranial Nerves: No cranial nerve deficit.     Motor: No abnormal muscle tone.     Coordination: Coordination normal.     Gait: Gait normal.     Deep Tendon Reflexes: Reflexes are normal and symmetric.  Psychiatric:        Behavior: Behavior normal.        Thought Content: Thought content normal.        Judgment: Judgment normal.     Lab Results  Component Value Date   WBC 9.4 05/12/2018   HGB 14.4 05/12/2018   HCT 42.7 05/12/2018   PLT 231.0 05/12/2018   GLUCOSE 97 11/09/2018   CHOL 181 11/10/2017   TRIG 172.0 (H) 11/10/2017   HDL 67.20 11/10/2017   LDLDIRECT 99.0 06/04/2017   LDLCALC 80 11/10/2017   ALT 23 06/04/2017   AST 19 06/04/2017   NA 138 11/09/2018   K 4.8 11/09/2018   CL 102 11/09/2018   CREATININE 0.87 11/09/2018   BUN 15 11/09/2018   CO2 27 11/09/2018   TSH 1.71 05/12/2018   PSA 4.29 (H) 02/26/2016   HGBA1C 5.6 03/22/2014    DG Chest 2 View  Result Date: 02/26/2016 CLINICAL DATA:  Dyspnea on exertion for the past year. History of hypertension. EXAM: CHEST  2 VIEW COMPARISON:  02/16/2012 FINDINGS: Normal cardiac silhouette and mediastinal contours with atherosclerotic plaque within the thoracic aorta. The lungs appear mildly hyperexpanded with flattening the bilaterally diaphragms. Minimal left basilar linear heterogeneous opacities favored to represent atelectasis or scar. No new focal airspace opacities. No pleural effusion or pneumothorax. No evidence of edema. No acute osseous abnormalities. IMPRESSION: 1. Minimal left basilar atelectasis / scar without acute cardiopulmonary disease. 2. Mild lung hyperexpansion.  Emphysema. (ICD10-J43.9) 3.  Aortic Atherosclerosis (ICD10-170.0) Electronically Signed   By: Sandi Mariscal M.D.   On: 02/26/2016 09:35    Assessment & Plan:   There are no diagnoses linked to this encounter.   No orders of the defined types were placed in this encounter.    Follow-up: No follow-ups on file.  Walker Kehr, MD

## 2019-05-11 NOTE — Assessment & Plan Note (Signed)
  BP Readings from Last 3 Encounters:  05/11/19 140/76  11/09/18 (!) 146/80  10/13/18 130/65

## 2019-05-11 NOTE — Assessment & Plan Note (Signed)
Labs Loose wt

## 2019-05-11 NOTE — Assessment & Plan Note (Signed)
>  2 years abd [place] [facility]

## 2019-05-17 ENCOUNTER — Ambulatory Visit
Admission: RE | Admit: 2019-05-17 | Discharge: 2019-05-17 | Disposition: A | Discharge status: Home / Self Care | Payer: PPO | Source: Ambulatory Visit | Attending: Internal Medicine | Admitting: Internal Medicine

## 2019-05-17 DIAGNOSIS — K760 Fatty (change of) liver, not elsewhere classified: Secondary | ICD-10-CM | POA: Diagnosis not present

## 2019-06-14 ENCOUNTER — Ambulatory Visit: Payer: PPO

## 2019-06-14 ENCOUNTER — Ambulatory Visit: Payer: PPO | Attending: Internal Medicine

## 2019-06-14 DIAGNOSIS — Z23 Encounter for immunization: Secondary | ICD-10-CM

## 2019-06-14 NOTE — Progress Notes (Signed)
   Covid-19 Vaccination Clinic  Name:  Oscar [name]    MRN: [id number] DOB: 1941/01/18  06/14/2019  [name] was observed post Covid-19 immunization for 15 minutes without incidence. He was provided with Vaccine Information Sheet and instruction to access the V-Safe system.   [name] was instructed to call 911 with any severe reactions post vaccine: Marland Kitchen Difficulty breathing  . Swelling of your face and throat  . A fast heartbeat  . A bad rash all over your body  . Dizziness and weakness    Immunizations Administered    Name Date Dose VIS Date Route   Pfizer COVID-19 Vaccine 06/14/2019 12:25 PM 0.3 mL 05/05/2019 Intramuscular   Manufacturer: [place]   Lot: [id number]   [place]: [id number]

## 2019-07-03 ENCOUNTER — Ambulatory Visit: Payer: PPO | Attending: Internal Medicine

## 2019-07-03 DIAGNOSIS — Z23 Encounter for immunization: Secondary | ICD-10-CM | POA: Insufficient documentation

## 2019-07-03 NOTE — Progress Notes (Signed)
   Covid-19 Vaccination Clinic  Name:  Oscar [name]    MRN: [id number] DOB: 1940-09-25  07/03/2019  Oscar [name] was observed post Covid-19 immunization for 15 minutes without incidence. He was provided with Vaccine Information Sheet and instruction to access the V-Safe system.   Oscar [name] was instructed to call 911 with any severe reactions post vaccine: Marland Kitchen Difficulty breathing  . Swelling of your face and throat  . A fast heartbeat  . A bad rash all over your body  . Dizziness and weakness    Immunizations Administered    Name Date Dose VIS Date Route   Pfizer COVID-19 Vaccine 07/03/2019  9:57 AM 0.3 mL 05/05/2019 Intramuscular   Manufacturer: [place]   Lot: [id number]   [place]: [id number]

## 2019-07-31 DIAGNOSIS — M67961 Unspecified disorder of synovium and tendon, right lower leg: Secondary | ICD-10-CM | POA: Diagnosis not present

## 2019-07-31 DIAGNOSIS — M67969 Unspecified disorder of synovium and tendon, unspecified lower leg: Secondary | ICD-10-CM | POA: Diagnosis not present

## 2019-08-04 DIAGNOSIS — L821 Other seborrheic keratosis: Secondary | ICD-10-CM | POA: Diagnosis not present

## 2019-08-04 DIAGNOSIS — Z85820 Personal history of malignant melanoma of skin: Secondary | ICD-10-CM | POA: Diagnosis not present

## 2019-08-04 DIAGNOSIS — L905 Scar conditions and fibrosis of skin: Secondary | ICD-10-CM | POA: Diagnosis not present

## 2019-08-04 DIAGNOSIS — L708 Other acne: Secondary | ICD-10-CM | POA: Diagnosis not present

## 2019-08-10 ENCOUNTER — Ambulatory Visit (INDEPENDENT_AMBULATORY_CARE_PROVIDER_SITE_OTHER): Payer: PPO

## 2019-08-10 ENCOUNTER — Encounter: Payer: Self-pay | Admitting: Internal Medicine

## 2019-08-10 ENCOUNTER — Other Ambulatory Visit: Payer: Self-pay

## 2019-08-10 ENCOUNTER — Ambulatory Visit (INDEPENDENT_AMBULATORY_CARE_PROVIDER_SITE_OTHER): Payer: PPO | Admitting: Internal Medicine

## 2019-08-10 DIAGNOSIS — M5440 Lumbago with sciatica, unspecified side: Secondary | ICD-10-CM

## 2019-08-10 DIAGNOSIS — I10 Essential (primary) hypertension: Secondary | ICD-10-CM

## 2019-08-10 DIAGNOSIS — E785 Hyperlipidemia, unspecified: Secondary | ICD-10-CM | POA: Diagnosis not present

## 2019-08-10 DIAGNOSIS — M545 Low back pain: Secondary | ICD-10-CM | POA: Diagnosis not present

## 2019-08-10 DIAGNOSIS — G8929 Other chronic pain: Secondary | ICD-10-CM

## 2019-08-10 DIAGNOSIS — R635 Abnormal weight gain: Secondary | ICD-10-CM | POA: Diagnosis not present

## 2019-08-10 MED ORDER — TIZANIDINE HCL 4 MG PO TABS: 4.0000 mg | ORAL_TABLET | Freq: Three times a day (TID) | ORAL | 1 refills | Status: DC | PRN

## 2019-08-10 NOTE — Assessment & Plan Note (Addendum)
Relapsing Tizanidine X ray Vit D

## 2019-08-10 NOTE — Patient Instructions (Signed)

## 2019-08-10 NOTE — Progress Notes (Signed)
Subjective:  Patient ID: Oscar [name], male    DOB: 1940/08/26  Age: 79 y.o. MRN: [id number]  CC: No chief complaint on file.   HPI Oscar [name] presents for LBP - worse F/u dyslipidemia, BPH BP ok at home  Outpatient Medications Prior to Visit  Medication Sig Dispense Refill  . ARIPiprazole (ABILIFY) 10 MG tablet Take 10 mg by mouth daily.      Marland Kitchen aspirin 325 MG tablet Take 325 mg by mouth daily.    Marland Kitchen atorvastatin (LIPITOR) 40 MG tablet Take 1 tablet (40 mg total) by mouth daily. 90 tablet 3  . Cholecalciferol (RA VITAMIN D-3) 25 MCG (1000 UT) tablet Take 1 tablet (1,000 Units total) by mouth daily. 90 tablet 3  . desvenlafaxine (PRISTIQ) 100 MG 24 hr tablet     . dutasteride (AVODART) 0.5 MG capsule TK 1 C PO QD    . famotidine-calcium carbonate-magnesium hydroxide (PEPCID COMPLETE) 10-800-165 MG chewable tablet Chew 1 tablet by mouth daily as needed.    Marland Kitchen losartan (COZAAR) 100 MG tablet TAKE 1 TABLET(100 MG) BY MOUTH DAILY 90 tablet 3  . multivitamin-lutein (OCUVITE-LUTEIN) CAPS capsule Take 1 capsule daily by mouth.    Marland Kitchen omeprazole (PRILOSEC) 20 MG capsule Take 40 mg by mouth daily.    . sertraline (ZOLOFT) 100 MG tablet Take 100 mg by mouth daily.     . tamsulosin (FLOMAX) 0.4 MG CAPS capsule Take 0.4 mg by mouth daily.     No facility-administered medications prior to visit.    ROS: Review of Systems  Constitutional: Negative for appetite change, fatigue and unexpected weight change.  HENT: Negative for congestion, nosebleeds, sneezing, sore throat and trouble swallowing.   Eyes: Negative for itching and visual disturbance.  Respiratory: Negative for cough.   Cardiovascular: Negative for chest pain, palpitations and leg swelling.  Gastrointestinal: Negative for abdominal distention, blood in stool, diarrhea and nausea.  Genitourinary: Negative for frequency and hematuria.  Musculoskeletal: Positive for back pain. Negative for gait problem, joint swelling and neck pain.    Skin: Negative for rash.  Neurological: Negative for dizziness, tremors, speech difficulty and weakness.  Psychiatric/Behavioral: Negative for agitation, dysphoric mood, sleep disturbance and suicidal ideas. The patient is not nervous/anxious.     Objective:  BP (!) 164/86 (BP Location: Left Arm, Patient Position: Sitting, Cuff Size: Large)   Pulse (!) 57   Temp 98.5 F (36.9 C) (Oral)   Ht 6\' 1"  (1.854 m)   Wt 239 lb (108.4 kg)   SpO2 98%   BMI 31.53 kg/m   BP Readings from Last 3 Encounters:  08/10/19 (!) 164/86  05/11/19 140/76  11/09/18 (!) 146/80    Wt Readings from Last 3 Encounters:  08/10/19 239 lb (108.4 kg)  05/11/19 240 lb (108.9 kg)  11/09/18 237 lb (107.5 kg)    Physical Exam Constitutional:      General: He is not in acute distress.    Appearance: He is well-developed.     Comments: NAD  Eyes:     Conjunctiva/sclera: Conjunctivae normal.     Pupils: Pupils are equal, round, and reactive to light.  Neck:     Thyroid: No thyromegaly.     Vascular: No JVD.  Cardiovascular:     Rate and Rhythm: Normal rate and regular rhythm.     Heart sounds: Normal heart sounds. No murmur. No friction rub. No gallop.   Pulmonary:     Effort: Pulmonary effort is normal. No respiratory distress.  Breath sounds: Normal breath sounds. No wheezing or rales.  Chest:     Chest wall: No tenderness.  Abdominal:     General: Bowel sounds are normal. There is no distension.     Palpations: Abdomen is soft. There is no mass.     Tenderness: There is no abdominal tenderness. There is no guarding or rebound.  Musculoskeletal:        General: Tenderness present. Normal range of motion.     Cervical back: Normal range of motion.  Lymphadenopathy:     Cervical: No cervical adenopathy.  Skin:    General: Skin is warm and dry.     Findings: No rash.  Neurological:     Mental Status: He is alert and oriented to person, place, and time.     Cranial Nerves: No cranial nerve  deficit.     Motor: No abnormal muscle tone.     Coordination: Coordination normal.     Gait: Gait normal.     Deep Tendon Reflexes: Reflexes are normal and symmetric.  Psychiatric:        Behavior: Behavior normal.        Thought Content: Thought content normal.        Judgment: Judgment normal.    No bruit B  Lab Results  Component Value Date   WBC 10.0 05/11/2019   HGB 13.9 05/11/2019   HCT 41.1 05/11/2019   PLT 232.0 05/11/2019   GLUCOSE 101 (H) 05/11/2019   CHOL 182 05/11/2019   TRIG 351.0 (H) 05/11/2019   HDL 55.90 05/11/2019   LDLDIRECT 87.0 05/11/2019   LDLCALC 80 11/10/2017   ALT 27 05/11/2019   AST 20 05/11/2019   NA 135 05/11/2019   K 4.4 05/11/2019   CL 100 05/11/2019   CREATININE 0.89 05/11/2019   BUN 12 05/11/2019   CO2 27 05/11/2019   TSH 2.16 05/11/2019   PSA 4.29 (H) 02/26/2016   HGBA1C 5.8 05/11/2019    US Abdomen Complete  Result Date: 05/17/2019 CLINICAL DATA:  Nausea EXAM: ABDOMEN ULTRASOUND COMPLETE COMPARISON:  01/03/2010.  CT 04/27/2016 FINDINGS: Gallbladder: No gallstones or wall thickening visualized. No sonographic Murphy sign noted by sonographer. Common bile duct: Diameter: Normal caliber, 5 mm Liver: Diffusely increased echotexture compatible with fatty infiltration. 9 mm cyst in the left lobe. No biliary ductal dilatation. Portal vein is patent on color Doppler imaging with normal direction of blood flow towards the liver. IVC: No abnormality visualized. Pancreas: Visualized portion unremarkable. Spleen: Size and appearance within normal limits. Right Kidney: Length: 12.0 cm. Echogenicity within normal limits. No mass or hydronephrosis visualized. Left Kidney: Length: 11.1 cm. Echogenicity within normal limits. No mass or hydronephrosis visualized. Abdominal aorta: No aneurysm visualized. Other findings: None. IMPRESSION: Diffuse fatty infiltration of the liver. Electronically Signed   By: Rolm Baptise M.D.   On: 05/17/2019 11:23     Assessment & Plan:    Walker Kehr, MD

## 2019-08-10 NOTE — Assessment & Plan Note (Signed)
Lipitor 

## 2019-08-10 NOTE — Assessment & Plan Note (Signed)
A little better 

## 2019-08-10 NOTE — Assessment & Plan Note (Addendum)
On Losartan BP - nl at home

## 2019-08-25 DIAGNOSIS — L821 Other seborrheic keratosis: Secondary | ICD-10-CM | POA: Diagnosis not present

## 2019-08-25 DIAGNOSIS — D1801 Hemangioma of skin and subcutaneous tissue: Secondary | ICD-10-CM | POA: Diagnosis not present

## 2019-08-25 DIAGNOSIS — L570 Actinic keratosis: Secondary | ICD-10-CM | POA: Diagnosis not present

## 2019-08-25 DIAGNOSIS — D225 Melanocytic nevi of trunk: Secondary | ICD-10-CM | POA: Diagnosis not present

## 2019-08-25 DIAGNOSIS — Z85820 Personal history of malignant melanoma of skin: Secondary | ICD-10-CM | POA: Diagnosis not present

## 2019-08-25 DIAGNOSIS — L905 Scar conditions and fibrosis of skin: Secondary | ICD-10-CM | POA: Diagnosis not present

## 2019-11-15 ENCOUNTER — Ambulatory Visit (INDEPENDENT_AMBULATORY_CARE_PROVIDER_SITE_OTHER): Payer: PPO | Admitting: Internal Medicine

## 2019-11-15 ENCOUNTER — Encounter: Payer: Self-pay | Admitting: Internal Medicine

## 2019-11-15 ENCOUNTER — Other Ambulatory Visit: Payer: Self-pay

## 2019-11-15 DIAGNOSIS — E785 Hyperlipidemia, unspecified: Secondary | ICD-10-CM | POA: Diagnosis not present

## 2019-11-15 DIAGNOSIS — K2100 Gastro-esophageal reflux disease with esophagitis, without bleeding: Secondary | ICD-10-CM | POA: Diagnosis not present

## 2019-11-15 DIAGNOSIS — I10 Essential (primary) hypertension: Secondary | ICD-10-CM

## 2019-11-15 DIAGNOSIS — L989 Disorder of the skin and subcutaneous tissue, unspecified: Secondary | ICD-10-CM

## 2019-11-15 MED ORDER — CLOBETASOL PROPIONATE 0.05 % EX SOLN: 1.0000 "application " | Freq: Two times a day (BID) | CUTANEOUS | 3 refills | Status: DC

## 2019-11-15 MED ORDER — OLMESARTAN MEDOXOMIL-HCTZ 40-12.5 MG PO TABS: 1.0000 | ORAL_TABLET | Freq: Every day | ORAL | 3 refills | Status: DC

## 2019-11-15 NOTE — Assessment & Plan Note (Signed)
Worse D/c Losartan  Olmesartan HCT

## 2019-11-15 NOTE — Progress Notes (Signed)
Subjective:  Patient ID: Oscar [name], male    DOB: [birth year]  Age: 79 y.o. MRN: [id number]  CC: No chief complaint on file.   HPI Oscar [name] presents for HTN SBP 170 at home F/u dyslipidemia, GERD, anxiety Pt had n/v/d for a few days - resolved  Outpatient Medications Prior to Visit  Medication Sig Dispense Refill  . ARIPiprazole (ABILIFY) 10 MG tablet Take 10 mg by mouth daily.      Marland Kitchen aspirin 325 MG tablet Take 325 mg by mouth daily.    Marland Kitchen atorvastatin (LIPITOR) 40 MG tablet Take 1 tablet (40 mg total) by mouth daily. 90 tablet 3  . Cholecalciferol (RA VITAMIN D-3) 25 MCG (1000 UT) tablet Take 1 tablet (1,000 Units total) by mouth daily. 90 tablet 3  . desvenlafaxine (PRISTIQ) 100 MG 24 hr tablet     . dutasteride (AVODART) 0.5 MG capsule TK 1 C PO QD    . famotidine-calcium carbonate-magnesium hydroxide (PEPCID COMPLETE) 10-800-165 MG chewable tablet Chew 1 tablet by mouth daily as needed.    Marland Kitchen losartan (COZAAR) 100 MG tablet TAKE 1 TABLET(100 MG) BY MOUTH DAILY 90 tablet 3  . multivitamin-lutein (OCUVITE-LUTEIN) CAPS capsule Take 1 capsule daily by mouth.    Marland Kitchen omeprazole (PRILOSEC) 20 MG capsule Take 40 mg by mouth daily.    . sertraline (ZOLOFT) 100 MG tablet Take 100 mg by mouth daily.     . tamsulosin (FLOMAX) 0.4 MG CAPS capsule Take 0.4 mg by mouth daily.    Marland Kitchen tiZANidine (ZANAFLEX) 4 MG tablet Take 1 tablet (4 mg total) by mouth every 8 (eight) hours as needed for muscle spasms (back pain). 60 tablet 1   No facility-administered medications prior to visit.    ROS: Review of Systems  Constitutional: Negative for appetite change, fatigue and unexpected weight change.  HENT: Negative for congestion, nosebleeds, sneezing, sore throat and trouble swallowing.   Eyes: Negative for itching and visual disturbance.  Respiratory: Negative for cough.   Cardiovascular: Negative for chest pain, palpitations and leg swelling.  Gastrointestinal: Negative for abdominal distention,  blood in stool, diarrhea and nausea.  Genitourinary: Negative for frequency and hematuria.  Musculoskeletal: Positive for arthralgias. Negative for back pain, gait problem, joint swelling and neck pain.  Skin: Positive for rash.  Neurological: Negative for dizziness, tremors, speech difficulty and weakness.  Psychiatric/Behavioral: Negative for agitation, dysphoric mood and sleep disturbance. The patient is nervous/anxious.    To raised scaly patches on scalp R rx3 cm frontal 2x1 cm  Objective:  BP (!) 154/92 (BP Location: Left Arm, Patient Position: Sitting, Cuff Size: Large)   Pulse 60   Temp 98.3 F (36.8 C) (Oral)   Ht 6\' 1"  (1.854 m)   Wt 232 lb (105.2 kg)   SpO2 98%   BMI 30.61 kg/m   BP Readings from Last 3 Encounters:  11/15/19 (!) 154/92  08/10/19 (!) 164/86  05/11/19 140/76    Wt Readings from Last 3 Encounters:  11/15/19 232 lb (105.2 kg)  08/10/19 239 lb (108.4 kg)  05/11/19 240 lb (108.9 kg)    Physical Exam  Lab Results  Component Value Date   WBC 10.0 05/11/2019   HGB 13.9 05/11/2019   HCT 41.1 05/11/2019   PLT 232.0 05/11/2019   GLUCOSE 101 (H) 05/11/2019   CHOL 182 05/11/2019   TRIG 351.0 (H) 05/11/2019   HDL 55.90 05/11/2019   LDLDIRECT 87.0 05/11/2019   LDLCALC 80 11/10/2017   ALT 27 05/11/2019  AST 20 05/11/2019   NA 135 05/11/2019   K 4.4 05/11/2019   CL 100 05/11/2019   CREATININE 0.89 05/11/2019   BUN 12 05/11/2019   CO2 27 05/11/2019   TSH 2.16 05/11/2019   PSA 4.29 (H) 02/26/2016   HGBA1C 5.8 05/11/2019    US Abdomen Complete  Result Date: 05/17/2019 CLINICAL DATA:  Nausea EXAM: ABDOMEN ULTRASOUND COMPLETE COMPARISON:  01/03/2010.  CT 04/27/2016 FINDINGS: Gallbladder: No gallstones or wall thickening visualized. No sonographic Murphy sign noted by sonographer. Common bile duct: Diameter: Normal caliber, 5 mm Liver: Diffusely increased echotexture compatible with fatty infiltration. 9 mm cyst in the left lobe. No biliary ductal  dilatation. Portal vein is patent on color Doppler imaging with normal direction of blood flow towards the liver. IVC: No abnormality visualized. Pancreas: Visualized portion unremarkable. Spleen: Size and appearance within normal limits. Right Kidney: Length: 12.0 cm. Echogenicity within normal limits. No mass or hydronephrosis visualized. Left Kidney: Length: 11.1 cm. Echogenicity within normal limits. No mass or hydronephrosis visualized. Abdominal aorta: No aneurysm visualized. Other findings: None. IMPRESSION: Diffuse fatty infiltration of the liver. Electronically Signed   By: Rolm Baptise M.D.   On: 05/17/2019 11:23    Assessment & Plan:   There are no diagnoses linked to this encounter.   No orders of the defined types were placed in this encounter.    Follow-up: No follow-ups on file.  Walker Kehr, MD

## 2019-11-15 NOTE — Assessment & Plan Note (Signed)
Lipitor 

## 2019-11-15 NOTE — Assessment & Plan Note (Signed)
Omeprasole

## 2019-11-15 NOTE — Assessment & Plan Note (Addendum)
?  irritated SK or SD vs other Clobetasol sol bid Derm ref

## 2019-11-22 ENCOUNTER — Other Ambulatory Visit: Payer: Self-pay | Admitting: Internal Medicine

## 2019-11-22 DIAGNOSIS — R634 Abnormal weight loss: Secondary | ICD-10-CM

## 2019-11-22 DIAGNOSIS — R110 Nausea: Secondary | ICD-10-CM

## 2019-11-23 ENCOUNTER — Ambulatory Visit (INDEPENDENT_AMBULATORY_CARE_PROVIDER_SITE_OTHER): Payer: PPO

## 2019-11-23 ENCOUNTER — Other Ambulatory Visit (INDEPENDENT_AMBULATORY_CARE_PROVIDER_SITE_OTHER): Payer: PPO

## 2019-11-23 DIAGNOSIS — R110 Nausea: Secondary | ICD-10-CM

## 2019-11-23 DIAGNOSIS — R634 Abnormal weight loss: Secondary | ICD-10-CM

## 2019-11-23 LAB — CBC WITH DIFFERENTIAL/PLATELET
Basophils Absolute: 0 10*3/uL (ref 0.0–0.1)
Basophils Relative: 0.1 % (ref 0.0–3.0)
Eosinophils Absolute: 0.3 10*3/uL (ref 0.0–0.7)
Eosinophils Relative: 3.3 % (ref 0.0–5.0)
HCT: 41.5 % (ref 39.0–52.0)
Hemoglobin: 14.4 g/dL (ref 13.0–17.0)
Lymphocytes Relative: 24.6 % (ref 12.0–46.0)
Lymphs Abs: 2.5 10*3/uL (ref 0.7–4.0)
MCHC: 34.7 g/dL (ref 30.0–36.0)
MCV: 89.5 fl (ref 78.0–100.0)
Monocytes Absolute: 0.7 10*3/uL (ref 0.1–1.0)
Monocytes Relative: 6.9 % (ref 3.0–12.0)
Neutro Abs: 6.5 10*3/uL (ref 1.4–7.7)
Neutrophils Relative %: 65.1 % (ref 43.0–77.0)
Platelets: 238 10*3/uL (ref 150.0–400.0)
RBC: 4.63 Mil/uL (ref 4.22–5.81)
RDW: 13.5 % (ref 11.5–15.5)
WBC: 10 10*3/uL (ref 4.0–10.5)

## 2019-11-23 LAB — HEPATIC FUNCTION PANEL
ALT: 38 U/L (ref 0–53)
AST: 26 U/L (ref 0–37)
Albumin: 4.7 g/dL (ref 3.5–5.2)
Alkaline Phosphatase: 78 U/L (ref 39–117)
Bilirubin, Direct: 0.2 mg/dL (ref 0.0–0.3)
Total Bilirubin: 0.8 mg/dL (ref 0.2–1.2)
Total Protein: 7.1 g/dL (ref 6.0–8.3)

## 2019-11-23 LAB — BASIC METABOLIC PANEL
BUN: 15 mg/dL (ref 6–23)
CO2: 25 mEq/L (ref 19–32)
Calcium: 9.3 mg/dL (ref 8.4–10.5)
Chloride: 103 mEq/L (ref 96–112)
Creatinine, Ser: 0.87 mg/dL (ref 0.40–1.50)
GFR: 84.82 mL/min (ref >60.00)
Glucose, Bld: 106 mg/dL — ABNORMAL HIGH (ref 70–99)
Potassium: 4 mEq/L (ref 3.5–5.1)
Sodium: 141 mEq/L (ref 135–145)

## 2019-11-23 LAB — LIPASE: Lipase: 24 U/L (ref 11.0–59.0)

## 2019-11-23 LAB — PSA: PSA: 1.1 ng/mL (ref 0.10–4.00)

## 2019-11-23 LAB — TSH: TSH: 1.93 u[IU]/mL (ref 0.35–4.50)

## 2019-12-11 DIAGNOSIS — L218 Other seborrheic dermatitis: Secondary | ICD-10-CM | POA: Diagnosis not present

## 2019-12-20 DIAGNOSIS — L821 Other seborrheic keratosis: Secondary | ICD-10-CM | POA: Diagnosis not present

## 2019-12-20 DIAGNOSIS — L720 Epidermal cyst: Secondary | ICD-10-CM | POA: Diagnosis not present

## 2020-01-11 ENCOUNTER — Ambulatory Visit (INDEPENDENT_AMBULATORY_CARE_PROVIDER_SITE_OTHER): Payer: PPO

## 2020-01-11 DIAGNOSIS — Z0000 Encounter for general adult medical examination without abnormal findings: Secondary | ICD-10-CM | POA: Diagnosis not present

## 2020-01-11 NOTE — Patient Instructions (Addendum)
Oscar [name] , Thank you for taking time to come for your Medicare Wellness Visit. I appreciate your ongoing commitment to your health goals. Please review the following plan we discussed and let me know if I can assist you in the future.   Screening recommendations/referrals: Colonoscopy: 10/13/2018; due every 5 years Recommended yearly ophthalmology/optometry visit for glaucoma screening and checkup Recommended yearly dental visit for hygiene and checkup  Vaccinations: Influenza vaccine: 01/25/2019 Pneumococcal vaccine: completed Tdap vaccine: 08/12/2016; due every 10 years Shingles vaccine: never done; can check with local pharmacy for vaccination or contact Dr. Judeen Hammans office   Covid-19: completed  Advanced directives: Advance directive discussed with you today. I have provided a copy for you to complete at home and have notarized. Once this is complete please bring a copy in to our office so we can scan it into your chart.  Conditions/risks identified: Yes; Please continue to do your personal lifestyle choices by: daily care of teeth and gums, regular physical activity (goal should be 5 days a week for 30 minutes), eat a healthy diet, avoid tobacco and drug use, limiting any alcohol intake, taking a low-dose aspirin (if not allergic or have been advised by your provider otherwise) and taking vitamins and minerals as recommended by your provider. Continue doing brain stimulating activities (puzzles, reading, adult coloring books, staying active) to keep memory sharp. Continue to eat heart healthy diet (full of fruits, vegetables, whole grains, lean protein, water--limit salt, fat, and sugar intake) and increase physical activity as tolerated.  Next appointment: Please schedule your next Medicare Wellness Visit with your Nurse Health Advisor in 1 year.  Preventive Care 40 Years and Older, Male Preventive care refers to lifestyle choices and visits with your health care provider that can  promote health and wellness. What does preventive care include?  A yearly physical exam. This is also called an annual well check.  Dental exams once or twice a year.  Routine eye exams. Ask your health care provider how often you should have your eyes checked.  Personal lifestyle choices, including:  Daily care of your teeth and gums.  Regular physical activity.  Eating a healthy diet.  Avoiding tobacco and drug use.  Limiting alcohol use.  Practicing safe sex.  Taking low doses of aspirin every day.  Taking vitamin and mineral supplements as recommended by your health care provider. What happens during an annual well check? The services and screenings done by your health care provider during your annual well check will depend on your age, overall health, lifestyle risk factors, and family history of disease. Counseling  Your health care provider may ask you questions about your:  Alcohol use.  Tobacco use.  Drug use.  Emotional well-being.  Home and relationship well-being.  Sexual activity.  Eating habits.  History of falls.  Memory and ability to understand (cognition).  Work and work Statistician. Screening  You may have the following tests or measurements:  Height, weight, and BMI.  Blood pressure.  Lipid and cholesterol levels. These may be checked every 5 years, or more frequently if you are over 36 years old.  Skin check.  Lung cancer screening. You may have this screening every year starting at age 22 if you have a 30-pack-year history of smoking and currently smoke or have quit within the past 15 years.  Fecal occult blood test (FOBT) of the stool. You may have this test every year starting at age 57.  Flexible sigmoidoscopy or colonoscopy. You may have a  sigmoidoscopy every 5 years or a colonoscopy every 10 years starting at age 33.  Prostate cancer screening. Recommendations will vary depending on your family history and other  risks.  Hepatitis C blood test.  Hepatitis B blood test.  Sexually transmitted disease (STD) testing.  Diabetes screening. This is done by checking your blood sugar (glucose) after you have not eaten for a while (fasting). You may have this done every 1-3 years.  Abdominal aortic aneurysm (AAA) screening. You may need this if you are a current or former smoker.  Osteoporosis. You may be screened starting at age 70 if you are at high risk. Talk with your health care provider about your test results, treatment options, and if necessary, the need for more tests. Vaccines  Your health care provider may recommend certain vaccines, such as:  Influenza vaccine. This is recommended every year.  Tetanus, diphtheria, and acellular pertussis (Tdap, Td) vaccine. You may need a Td booster every 10 years.  Zoster vaccine. You may need this after age 72.  Pneumococcal 13-valent conjugate (PCV13) vaccine. One dose is recommended after age 34.  Pneumococcal polysaccharide (PPSV23) vaccine. One dose is recommended after age 37. Talk to your health care provider about which screenings and vaccines you need and how often you need them. This information is not intended to replace advice given to you by your health care provider. Make sure you discuss any questions you have with your health care provider. Document Released: 06/07/2015 Document Revised: 01/29/2016 Document Reviewed: 03/12/2015 Elsevier Interactive Patient Education  2017 [employer] Prevention in the Home Falls can cause injuries. They can happen to people of all ages. There are many things you can do to make your home safe and to help prevent falls. What can I do on the outside of my home?  Regularly fix the edges of walkways and driveways and fix any cracks.  Remove anything that might make you trip as you walk through a door, such as a raised step or threshold.  Trim any bushes or trees on the path to your home.  Use  bright outdoor lighting.  Clear any walking paths of anything that might make someone trip, such as rocks or tools.  Regularly check to see if handrails are loose or broken. Make sure that both sides of any steps have handrails.  Any raised decks and porches should have guardrails on the edges.  Have any leaves, snow, or ice cleared regularly.  Use sand or salt on walking paths during winter.  Clean up any spills in your garage right away. This includes oil or grease spills. What can I do in the bathroom?  Use night lights.  Install grab bars by the toilet and in the tub and shower. Do not use towel bars as grab bars.  Use non-skid mats or decals in the tub or shower.  If you need to sit down in the shower, use a plastic, non-slip stool.  Keep the floor dry. Clean up any water that spills on the floor as soon as it happens.  Remove soap buildup in the tub or shower regularly.  Attach bath mats securely with double-sided non-slip rug tape.  Do not have throw rugs and other things on the floor that can make you trip. What can I do in the bedroom?  Use night lights.  Make sure that you have a light by your bed that is easy to reach.  Do not use any sheets or blankets that are  too big for your bed. They should not hang down onto the floor.  Have a firm chair that has side arms. You can use this for support while you get dressed.  Do not have throw rugs and other things on the floor that can make you trip. What can I do in the kitchen?  Clean up any spills right away.  Avoid walking on wet floors.  Keep items that you use a lot in easy-to-reach places.  If you need to reach something above you, use a strong step stool that has a grab bar.  Keep electrical cords out of the way.  Do not use floor polish or wax that makes floors slippery. If you must use wax, use non-skid floor wax.  Do not have throw rugs and other things on the floor that can make you trip. What can  I do with my stairs?  Do not leave any items on the stairs.  Make sure that there are handrails on both sides of the stairs and use them. Fix handrails that are broken or loose. Make sure that handrails are as long as the stairways.  Check any carpeting to make sure that it is firmly attached to the stairs. Fix any carpet that is loose or worn.  Avoid having throw rugs at the top or bottom of the stairs. If you do have throw rugs, attach them to the floor with carpet tape.  Make sure that you have a light switch at the top of the stairs and the bottom of the stairs. If you do not have them, ask someone to add them for you. What else can I do to help prevent falls?  Wear shoes that:  Do not have high heels.  Have rubber bottoms.  Are comfortable and fit you well.  Are closed at the toe. Do not wear sandals.  If you use a stepladder:  Make sure that it is fully opened. Do not climb a closed stepladder.  Make sure that both sides of the stepladder are locked into place.  Ask someone to hold it for you, if possible.  Clearly mark and make sure that you can see:  Any grab bars or handrails.  First and last steps.  Where the edge of each step is.  Use tools that help you move around (mobility aids) if they are needed. These include:  Canes.  Walkers.  Scooters.  Crutches.  Turn on the lights when you go into a dark area. Replace any light bulbs as soon as they burn out.  Set up your furniture so you have a clear path. Avoid moving your furniture around.  If any of your floors are uneven, fix them.  If there are any pets around you, be aware of where they are.  Review your medicines with your doctor. Some medicines can make you feel dizzy. This can increase your chance of falling. Ask your doctor what other things that you can do to help prevent falls. This information is not intended to replace advice given to you by your health care provider. Make sure you  discuss any questions you have with your health care provider. Document Released: 03/07/2009 Document Revised: 10/17/2015 Document Reviewed: 06/15/2014 Elsevier Interactive Patient Education  2017 [employer].

## 2020-01-11 NOTE — Progress Notes (Addendum)
I connected with Oscar [name] today by telephone and verified that I am speaking with the correct person using two identifiers. Location patient: home Location provider: work Persons participating in the virtual visit: Damel Querry and [employer]. Lillie Bollig, LPN   I discussed the limitations, risks, security and privacy concerns of performing an evaluation and management service by telephone and the availability of in person appointments. I also discussed with the patient that there may be a patient responsible charge related to this service. The patient expressed understanding and verbally consented to this telephonic visit.    Interactive audio and video telecommunications were attempted between this provider and patient, however failed, due to patient having technical difficulties OR patient did not have access to video capability.  We continued and completed visit with audio only.  Some vital signs may be absent or patient reported.   Time Spent with patient on telephone encounter: 20 minutes  Subjective:   Oscar [name] is a 79 y.o. male who presents for Medicare Annual/Subsequent preventive examination.  Review of Systems    No ROS. Medicare Wellness Virtual Visit. Additional risk factors are reflected in social history. Cardiac Risk Factors include: advanced age (>34mn, >>71women);dyslipidemia;family history of premature cardiovascular disease;hypertension;male gender     Objective:    There were no vitals filed for this visit. There is no height or weight on file to calculate BMI.  Advanced Directives 01/11/2020 06/08/2018 04/02/2017 05/23/2015 01/09/2015  Does Patient Have a Medical Advance Directive? No Yes Yes Yes Yes  Type of [place]ive - [place] -  Does patient want to make changes to medical advance directive? Yes (MAU/Ambulatory/Procedural Areas -  Information given) - - - -  Copy of [place] Chart? - No - copy requested No - copy requested - Yes    Current Medications (verified) Outpatient Encounter Medications as of 01/11/2020  Medication Sig   ARIPiprazole (ABILIFY) 10 MG tablet Take 10 mg by mouth daily.     aspirin 325 MG tablet Take 325 mg by mouth daily.   atorvastatin (LIPITOR) 40 MG tablet Take 1 tablet (40 mg total) by mouth daily.   Cholecalciferol (RA VITAMIN D-3) 25 MCG (1000 UT) tablet Take 1 tablet (1,000 Units total) by mouth daily.   clobetasol (TEMOVATE) 0.05 % external solution Apply 1 application topically 2 (two) times daily. Use for scalp lesions   desvenlafaxine (PRISTIQ) 100 MG 24 hr tablet    dutasteride (AVODART) 0.5 MG capsule TK 1 C PO QD   famotidine-calcium carbonate-magnesium hydroxide (PEPCID COMPLETE) 10-800-165 MG chewable tablet Chew 1 tablet by mouth daily as needed.   multivitamin-lutein (OCUVITE-LUTEIN) CAPS capsule Take 1 capsule daily by mouth.   olmesartan-hydrochlorothiazide (BENICAR HCT) 40-12.5 MG tablet Take 1 tablet by mouth daily.   omeprazole (PRILOSEC) 20 MG capsule Take 40 mg by mouth daily.   sertraline (ZOLOFT) 100 MG tablet Take 100 mg by mouth daily.    tamsulosin (FLOMAX) 0.4 MG CAPS capsule Take 0.4 mg by mouth daily.   tiZANidine (ZANAFLEX) 4 MG tablet Take 1 tablet (4 mg total) by mouth every 8 (eight) hours as needed for muscle spasms (back pain).   No facility-administered encounter medications on file as of 01/11/2020.    Allergies (verified) Celecoxib, Penicillins, and Rofecoxib   History: Past Medical History:  Diagnosis Date   Adenomatous colon polyp    Allergic rhinitis, cause unspecified  Allergy    Anxiety state, unspecified    Barrett esophagus    BPH (benign prostatic hypertrophy)    BX 2007   Cancer ([place])    melanoma   Cataract    bilaterally removed   Depressive disorder, not elsewhere classified    GERD (gastroesophageal  reflux disease)    Hypertension    Hypogonadism male    Microhematuria    Neuromuscular disorder ([place])    small HH 05-26-2012 egd    Other and unspecified hyperlipidemia    Rosacea    UTI (lower urinary tract infection) 2009   Past Surgical History:  Procedure Laterality Date   CATARACT EXTRACTION  05/10/2012   left   COLONOSCOPY  04-22-2010   ESOPHAGOGASTRODUODENOSCOPY  05-26-2012   [facility]   HAND SURGERY Left    MELANOMA EXCISION     POLYPECTOMY     prev colon TA polyps- no polyps in 2011   PROSTATE BIOPSY  2011   Dr Winifred Olive    UPPER GASTROINTESTINAL ENDOSCOPY     Family History  Problem Relation Age of Onset   Hypertension Mother    Cancer Mother        colon ca   Colon cancer Mother 11   Heart disease Father        MI   Coronary artery disease Other    Stomach cancer Maternal Grandfather    Colon polyps Neg Hx    Esophageal cancer Neg Hx    Rectal cancer Neg Hx    Social History   Socioeconomic History   Marital status: Married    Spouse name: Not on file   Number of children: Not on file   Years of education: Not on file   Highest education level: Not on file  Occupational History   Occupation: [employer] - Retired 2010/june  Tobacco Use   Smoking status: Former Smoker    Packs/day: 1.50    Years: 20.00    Pack years: 30.00    Types: Cigarettes    Quit date: 05/25/1978    Years since quitting: 41.6   Smokeless tobacco: Never Used  Vaping Use   Vaping Use: Never used  Substance and Sexual Activity   Alcohol use: Yes    Alcohol/week: 7.0 standard drinks    Types: 7 Standard drinks or equivalent per week    Comment: 2 drinks per day, wine and mixed drinks   Drug use: No   Sexual activity: Yes  Other Topics Concern   Not on file  Social History Narrative   Married - Seperated 2010, reuniting 2012       Social Determinants of Health   Financial Resource Strain: Low Risk    Difficulty of Paying Living Expenses: Not hard at all  Food Insecurity: No Food  Insecurity   Worried About Charity fundraiser in the Last Year: Never true   Arboriculturist in the Last Year: Never true  Transportation Needs: No Transportation Needs   Lack of Transportation (Medical): No   Lack of Transportation (Non-Medical): No  Physical Activity: Sufficiently Active   Days of Exercise per Week: 5 days   Minutes of Exercise per Session: 30 min  Stress: No Stress Concern Present   Feeling of Stress : Not at all  Social Connections: Socially Integrated   Frequency of Communication with Friends and Family: More than three times a week   Frequency of Social Gatherings with Friends and Family: More than three times a week  Attends Religious Services: More than 4 times per year   Active Member of Clubs or Organizations: Yes   Attends Music therapist: More than 4 times per year   Marital Status: Married    Tobacco Counseling Counseling given: Not Answered   Clinical Intake:  Pre-visit preparation completed: Yes  Pain : No/denies pain     Nutritional Risks: None Diabetes: No  How often do you need to have someone help you when you read instructions, pamphlets, or other written materials from your doctor or pharmacy?: 1 - Never What is the last grade level you completed in school?: Bachelor's Degree  Diabetic? no  Interpreter Needed?: No  Information entered by :: Renee Erb N. Gita Dilger, LPN   Activities of Daily Living In your present state of health, do you have any difficulty performing the following activities: 01/11/2020  Hearing? Y  Comment wears hearing aids  Vision? N  Difficulty concentrating or making decisions? Y  Comment with names and dates  Walking or climbing stairs? N  Comment lives on main level of 2-story home  Dressing or bathing? N  Doing errands, shopping? N  Preparing Food and eating ? N  Using the Toilet? N  In the past six months, have you accidently leaked urine? Y  Comment treated at [facility]  Do you have problems with loss of bowel control? N  Managing your Medications? N  Managing your Finances? N  Housekeeping or managing your Housekeeping? N  Some recent data might be hidden    Patient Care Team: Plotnikov, Evie Lacks, MD as PCP - General Chucky May, MD (Psychiatry) Festus Aloe, MD as Attending Physician (Urology) Tanda Rockers, MD as Attending Physician (Pulmonary Disease) Ladene Artist, MD as Attending Physician (Gastroenterology)  Indicate any recent Medical Services you may have received from other than [facility] providers in the past year (date may be approximate).     Assessment:   This is a routine wellness examination for Oscar [name].  Hearing/Vision screen No exam data present  Dietary issues and exercise activities discussed: Current Exercise Habits: Home exercise routine, Type of exercise: walking;Other - see comments (golfing 2-3 times per week), Time (Minutes): 30, Frequency (Times/Week): 5, Weekly Exercise (Minutes/Week): 150, Intensity: Moderate, Exercise limited by: respiratory conditions(s)  Goals       Client understands the importance of follow-up with providers by attending scheduled visits (pt-stated)      My goal is stay alive and continue to lose weight.      Patient Stated      Continue to paint and perhaps check into tai chi classes to improve my balance.      to remain healthy (pt-stated)      [facility]; continue to engage in social activities; Take care of health ongoing       Depression Screen PHQ 2/9 Scores 01/11/2020 06/08/2018 04/02/2017 02/26/2016 01/09/2015 08/20/2014  PHQ - 2 Score 0 0 0 0 0 0  PHQ- 9 Score - 0 0 - - -    Fall Risk Fall Risk  01/11/2020 11/15/2019 06/08/2018 04/02/2017 02/26/2016  Falls in the past year? 0 0 0 No No  Comment - - - - -  Number falls in past yr: 0 0 - - -  Injury with Fall? 0 0 - - -  Risk for fall due to : No Fall Risks - Impaired balance/gait - -  Follow up Falls  evaluation completed - Falls prevention discussed - -  Any stairs in or around the home? Yes  If so, are there any without handrails? No  Home free of loose throw rugs in walkways, pet beds, electrical cords, etc? Yes  Adequate lighting in your home to reduce risk of falls? Yes   ASSISTIVE DEVICES UTILIZED TO PREVENT FALLS:  Life alert? No  Use of a cane, walker or w/c? No  Grab bars in the bathroom? No  Shower chair or bench in shower? No  Elevated toilet seat or a handicapped toilet? No   TIMED UP AND GO:  Was the test performed? No .  Length of time to ambulate 10 feet: 0 sec.   Gait steady and fast without use of assistive device  Cognitive Function: MMSE - Mini Mental State Exam 06/08/2018 04/02/2017  Orientation to time 5 5  Orientation to Place 5 5  Registration 3 3  Attention/ Calculation 5 5  Recall 3 3  Language- name 2 objects 2 2  Language- repeat 1 1  Language- follow 3 step command 3 3  Language- read & follow direction 1 1  Write a sentence 1 1  Copy design 1 1  Total score 30 30        Immunizations Immunization History  Administered Date(s) Administered   Influenza Split 04/09/2011, 03/01/2012   Influenza Whole 03/19/2009   Influenza, High Dose Seasonal PF 02/26/2016, 01/25/2019   Influenza,inj,Quad PF,6+ Mos 03/16/2013, 02/15/2014, 02/20/2015   Influenza-Unspecified 03/15/2017, 02/06/2018   PFIZER SARS-COV-2 Vaccination 06/14/2019, 07/03/2019   Pneumococcal Conjugate-13 08/15/2013   Pneumococcal Polysaccharide-23 07/22/2009   Td 08/20/2014   Tdap 08/12/2016   Zoster 02/20/2015    TDAP status: Up to date Flu Vaccine status: Up to date Pneumococcal vaccine status: Up to date Covid-19 vaccine status: Completed vaccines  Qualifies for Shingles Vaccine? Yes   Zostavax completed Yes   Shingrix Completed?: No.    Education has been provided regarding the importance of this vaccine. Patient has been advised to call insurance company to  determine out of pocket expense if they have not yet received this vaccine. Advised may also receive vaccine at local pharmacy or Health Dept. Verbalized acceptance and understanding.  Screening Tests Health Maintenance  Topic Date Due   Hepatitis C Screening  Never done   INFLUENZA VACCINE  12/24/2019   COLONOSCOPY  10/13/2023   TETANUS/TDAP  08/13/2026   COVID-19 Vaccine  Completed   PNA vac Low Risk Adult  Completed    Health Maintenance  Health Maintenance Due  Topic Date Due   Hepatitis C Screening  Never done   INFLUENZA VACCINE  12/24/2019    Colorectal cancer screening: Completed 10/13/2018. Repeat every 5 years  Lung Cancer Screening: (Low Dose CT Chest recommended if Age 81-80 years, 30 pack-year currently smoking OR have quit w/in 15years.) does not qualify.   Lung Cancer Screening Referral: no  Additional Screening:  Hepatitis C Screening: does qualify; Completed no  Vision Screening: Recommended annual ophthalmology exams for early detection of glaucoma and other disorders of the eye. Is the patient up to date with their annual eye exam?  Yes  Who is the provider or what is the name of the office in which the patient attends annual eye exams? [facility] (Dr. Chrystie Nose. Zigmund Daniel) If pt is not established with a provider, would they like to be referred to a provider to establish care? No .   Dental Screening: Recommended annual dental exams for proper oral hygiene  [employer]  Referral / Chronic Care Management: CRR required this visit?  No   CCM required this visit?  No      Plan:     I have personally reviewed and noted the following in the patient's chart:   Medical and social history Use of alcohol, tobacco or illicit drugs  Current medications and supplements Functional ability and status Nutritional status Physical activity Advanced directives List of other physicians Hospitalizations, surgeries, and ER visits in  previous 12 months Vitals Screenings to include cognitive, depression, and falls Referrals and appointments  In addition, I have reviewed and discussed with patient certain preventive protocols, quality metrics, and best practice recommendations. A written personalized care plan for preventive services as well as general preventive health recommendations were provided to patient.     Sheral Flow, LPN   [id number]   Nurse Notes:  Patient is cogitatively intact. There were no vitals filed for this visit. There is no height or weight on file to calculate BMI.  Medical screening examination/treatment/procedure(s) were performed by non-physician practitioner and as supervising physician I was immediately available for consultation/collaboration.  I agree with above. Lew Dawes, MD

## 2020-01-15 ENCOUNTER — Other Ambulatory Visit: Payer: Self-pay

## 2020-01-15 ENCOUNTER — Ambulatory Visit (INDEPENDENT_AMBULATORY_CARE_PROVIDER_SITE_OTHER): Payer: PPO | Admitting: Internal Medicine

## 2020-01-15 ENCOUNTER — Encounter: Payer: Self-pay | Admitting: Internal Medicine

## 2020-01-15 DIAGNOSIS — K2270 Barrett's esophagus without dysplasia: Secondary | ICD-10-CM

## 2020-01-15 DIAGNOSIS — S81802A Unspecified open wound, left lower leg, initial encounter: Secondary | ICD-10-CM | POA: Insufficient documentation

## 2020-01-15 DIAGNOSIS — I10 Essential (primary) hypertension: Secondary | ICD-10-CM | POA: Diagnosis not present

## 2020-01-15 MED ORDER — DOXYCYCLINE HYCLATE 100 MG PO TABS
100.0000 mg | ORAL_TABLET | Freq: Two times a day (BID) | ORAL | 0 refills | Status: DC
Start: 2020-01-15 — End: 2020-07-17

## 2020-01-15 MED ORDER — MUPIROCIN 2 % EX OINT: TOPICAL_OINTMENT | CUTANEOUS | 0 refills | Status: DC

## 2020-01-15 NOTE — Progress Notes (Signed)
Subjective:  Patient ID: Oscar [name], male    DOB: 11-May-1941  Age: 79 y.o. MRN: [id number]  CC: Follow-up   HPI Oscar [name] presents for L calf wound - cyst removed 3 wks ago F/u HTN, obesity, GERD/Barrett's  Outpatient Medications Prior to Visit  Medication Sig Dispense Refill  . ARIPiprazole (ABILIFY) 10 MG tablet Take 10 mg by mouth daily.      Marland Kitchen aspirin 325 MG tablet Take 325 mg by mouth daily.    Marland Kitchen atorvastatin (LIPITOR) 40 MG tablet Take 1 tablet (40 mg total) by mouth daily. 90 tablet 3  . Cholecalciferol (RA VITAMIN D-3) 25 MCG (1000 UT) tablet Take 1 tablet (1,000 Units total) by mouth daily. 90 tablet 3  . clobetasol (TEMOVATE) 0.05 % external solution Apply 1 application topically 2 (two) times daily. Use for scalp lesions 50 mL 3  . desvenlafaxine (PRISTIQ) 100 MG 24 hr tablet     . dutasteride (AVODART) 0.5 MG capsule TK 1 C PO QD    . famotidine-calcium carbonate-magnesium hydroxide (PEPCID COMPLETE) 10-800-165 MG chewable tablet Chew 1 tablet by mouth daily as needed.    . multivitamin-lutein (OCUVITE-LUTEIN) CAPS capsule Take 1 capsule daily by mouth.    . olmesartan-hydrochlorothiazide (BENICAR HCT) 40-12.5 MG tablet Take 1 tablet by mouth daily. 90 tablet 3  . omeprazole (PRILOSEC) 20 MG capsule Take 40 mg by mouth daily.    . sertraline (ZOLOFT) 100 MG tablet Take 100 mg by mouth daily.     . tamsulosin (FLOMAX) 0.4 MG CAPS capsule Take 0.4 mg by mouth daily.    Marland Kitchen tiZANidine (ZANAFLEX) 4 MG tablet Take 1 tablet (4 mg total) by mouth every 8 (eight) hours as needed for muscle spasms (back pain). 60 tablet 1   No facility-administered medications prior to visit.    ROS: Review of Systems  Constitutional: Negative for appetite change, fatigue and unexpected weight change.  HENT: Negative for congestion, nosebleeds, sneezing, sore throat and trouble swallowing.   Eyes: Negative for itching and visual disturbance.  Respiratory: Negative for cough.     Cardiovascular: Negative for chest pain, palpitations and leg swelling.  Gastrointestinal: Negative for abdominal distention, blood in stool, diarrhea and nausea.  Genitourinary: Negative for frequency and hematuria.  Musculoskeletal: Negative for back pain, gait problem, joint swelling and neck pain.  Skin: Positive for wound. Negative for rash.  Neurological: Negative for dizziness, tremors, speech difficulty and weakness.  Psychiatric/Behavioral: Negative for agitation, dysphoric mood and sleep disturbance. The patient is not nervous/anxious.     Objective:  BP 140/66 (BP Location: Left Arm, Patient Position: Sitting, Cuff Size: Normal)   Pulse (!) 55   Temp 98.5 F (36.9 C) (Oral)   Ht 6\' 1"  (1.854 m)   Wt 234 lb 3.2 oz (106.2 kg)   SpO2 96%   BMI 30.90 kg/m   BP Readings from Last 3 Encounters:  01/15/20 140/66  11/15/19 (!) 154/92  08/10/19 (!) 164/86    Wt Readings from Last 3 Encounters:  01/15/20 234 lb 3.2 oz (106.2 kg)  11/15/19 232 lb (105.2 kg)  08/10/19 239 lb (108.4 kg)    Physical Exam Constitutional:      General: He is not in acute distress.    Appearance: He is well-developed. He is obese.     Comments: NAD  Eyes:     Conjunctiva/sclera: Conjunctivae normal.     Pupils: Pupils are equal, round, and reactive to light.  Neck:     Thyroid:  No thyromegaly.     Vascular: No JVD.  Cardiovascular:     Rate and Rhythm: Normal rate and regular rhythm.     Heart sounds: Normal heart sounds. No murmur heard.  No friction rub. No gallop.   Pulmonary:     Effort: Pulmonary effort is normal. No respiratory distress.     Breath sounds: Normal breath sounds. No wheezing or rales.  Chest:     Chest wall: No tenderness.  Abdominal:     General: Bowel sounds are normal. There is no distension.     Palpations: Abdomen is soft. There is no mass.     Tenderness: There is no abdominal tenderness. There is no guarding or rebound.  Musculoskeletal:        General:  No tenderness. Normal range of motion.     Cervical back: Normal range of motion.  Lymphadenopathy:     Cervical: No cervical adenopathy.  Skin:    General: Skin is warm and dry.     Findings: Lesion present. No rash.  Neurological:     Mental Status: He is alert and oriented to person, place, and time.     Cranial Nerves: No cranial nerve deficit.     Motor: No abnormal muscle tone.     Coordination: Coordination normal.     Gait: Gait normal.     Deep Tendon Reflexes: Reflexes are normal and symmetric.  Psychiatric:        Behavior: Behavior normal.        Thought Content: Thought content normal.        Judgment: Judgment normal.    1.5x1x1 cm open wound on the left posterolat distal shin  Wound was cleaned, dressed  Lab Results  Component Value Date   WBC 10.0 11/23/2019   HGB 14.4 11/23/2019   HCT 41.5 11/23/2019   PLT 238.0 11/23/2019   GLUCOSE 106 (H) 11/23/2019   CHOL 182 05/11/2019   TRIG 351.0 (H) 05/11/2019   HDL 55.90 05/11/2019   LDLDIRECT 87.0 05/11/2019   LDLCALC 80 11/10/2017   ALT 38 11/23/2019   AST 26 11/23/2019   NA 141 11/23/2019   K 4.0 11/23/2019   CL 103 11/23/2019   CREATININE 0.87 11/23/2019   BUN 15 11/23/2019   CO2 25 11/23/2019   TSH 1.93 11/23/2019   PSA 1.10 11/23/2019   HGBA1C 5.8 05/11/2019    US Abdomen Complete  Result Date: 05/17/2019 CLINICAL DATA:  Nausea EXAM: ABDOMEN ULTRASOUND COMPLETE COMPARISON:  01/03/2010.  CT 04/27/2016 FINDINGS: Gallbladder: No gallstones or wall thickening visualized. No sonographic Murphy sign noted by sonographer. Common bile duct: Diameter: Normal caliber, 5 mm Liver: Diffusely increased echotexture compatible with fatty infiltration. 9 mm cyst in the left lobe. No biliary ductal dilatation. Portal vein is patent on color Doppler imaging with normal direction of blood flow towards the liver. IVC: No abnormality visualized. Pancreas: Visualized portion unremarkable. Spleen: Size and appearance within  normal limits. Right Kidney: Length: 12.0 cm. Echogenicity within normal limits. No mass or hydronephrosis visualized. Left Kidney: Length: 11.1 cm. Echogenicity within normal limits. No mass or hydronephrosis visualized. Abdominal aorta: No aneurysm visualized. Other findings: None. IMPRESSION: Diffuse fatty infiltration of the liver. Electronically Signed   By: Rolm Baptise M.D.   On: 05/17/2019 11:23    Assessment & Plan:   There are no diagnoses linked to this encounter.   No orders of the defined types were placed in this encounter.    Follow-up: No follow-ups on  file.  Walker Kehr, MD

## 2020-01-15 NOTE — Patient Instructions (Signed)
    A Band-Aid should be changed once or twice daily.  Keep the wound clean. You can wash it with liquid soap and water. Pat dry with gauze or a Kleenex tissue before applying antibiotic ointment and a Band-Aid.

## 2020-01-15 NOTE — Assessment & Plan Note (Signed)
On Prilosec

## 2020-01-15 NOTE — Assessment & Plan Note (Addendum)
L calf wound - cyst removed 3 wks ago Doxy Bactroban  A Band-Aid should be changed once or twice daily.  Keep the wound clean. You can wash it with liquid soap and water. Pat dry with gauze or a Kleenex tissue before applying antibiotic ointment and a Band-Aid.

## 2020-01-15 NOTE — Assessment & Plan Note (Signed)
Better Cont Olmesartan HCT

## 2020-01-16 DIAGNOSIS — N401 Enlarged prostate with lower urinary tract symptoms: Secondary | ICD-10-CM | POA: Diagnosis not present

## 2020-01-16 DIAGNOSIS — R3912 Poor urinary stream: Secondary | ICD-10-CM | POA: Diagnosis not present

## 2020-01-19 ENCOUNTER — Encounter (INDEPENDENT_AMBULATORY_CARE_PROVIDER_SITE_OTHER): Payer: PPO | Admitting: Ophthalmology

## 2020-01-30 ENCOUNTER — Encounter (INDEPENDENT_AMBULATORY_CARE_PROVIDER_SITE_OTHER): Payer: PPO | Admitting: Ophthalmology

## 2020-01-30 ENCOUNTER — Other Ambulatory Visit: Payer: Self-pay

## 2020-01-30 DIAGNOSIS — I10 Essential (primary) hypertension: Secondary | ICD-10-CM | POA: Diagnosis not present

## 2020-01-30 DIAGNOSIS — H33301 Unspecified retinal break, right eye: Secondary | ICD-10-CM | POA: Diagnosis not present

## 2020-01-30 DIAGNOSIS — H353132 Nonexudative age-related macular degeneration, bilateral, intermediate dry stage: Secondary | ICD-10-CM

## 2020-01-30 DIAGNOSIS — H43813 Vitreous degeneration, bilateral: Secondary | ICD-10-CM | POA: Diagnosis not present

## 2020-01-30 DIAGNOSIS — H3554 Dystrophies primarily involving the retinal pigment epithelium: Secondary | ICD-10-CM

## 2020-01-30 DIAGNOSIS — H35033 Hypertensive retinopathy, bilateral: Secondary | ICD-10-CM

## 2020-04-21 IMAGING — US US ABDOMEN COMPLETE
1 series · 14 of 25 positions shown · non-contrast
Comparison: 01/03/2010.  CT 04/27/2016

CLINICAL DATA: Nausea

EXAM:
ABDOMEN ULTRASOUND COMPLETE

[Series 1: us abdomen complete · 0.19mm/px · 14 of 75 slices shown]
[im 1/75]
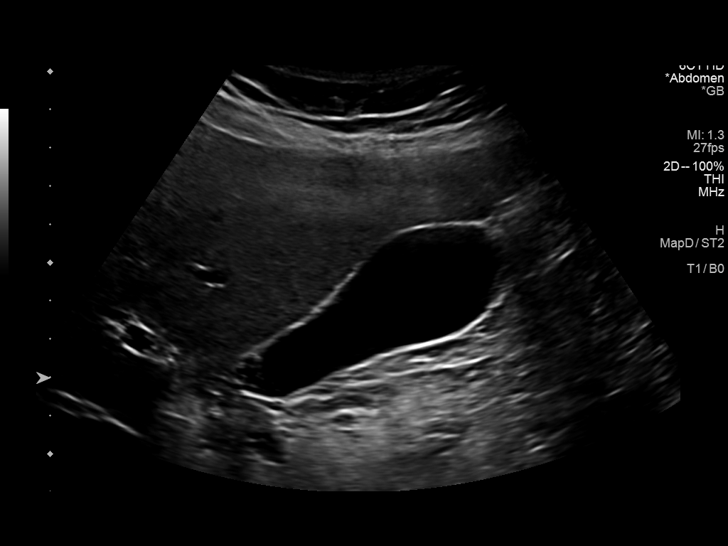
[im 7/75]
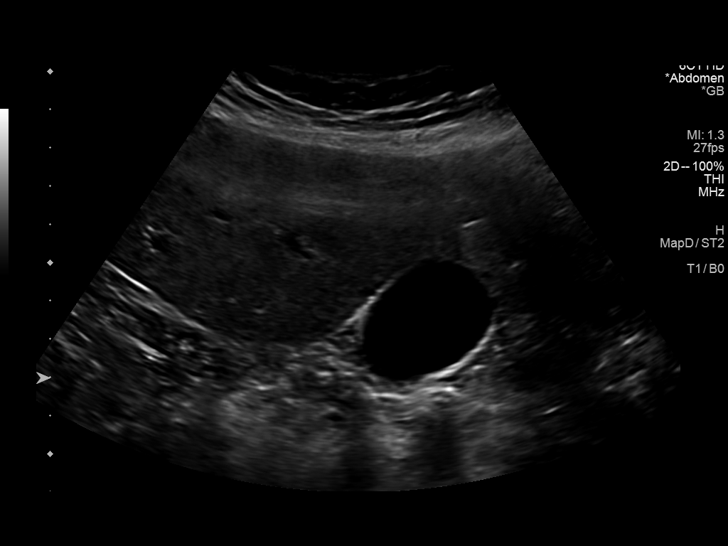
[im 13/75]
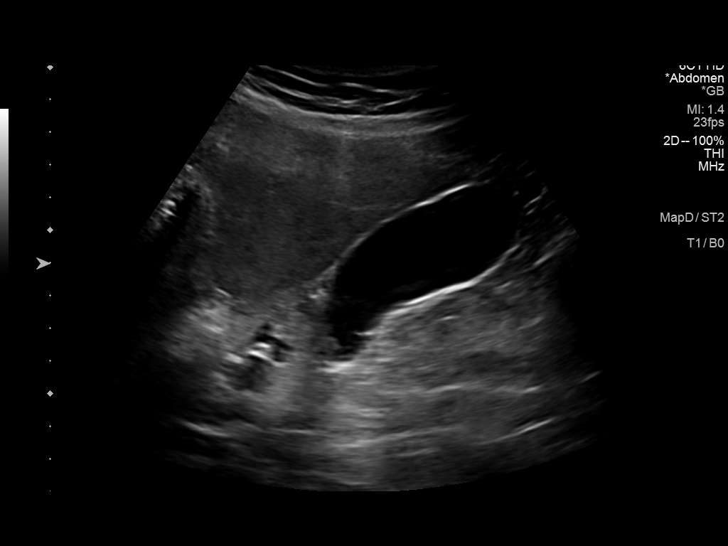
[im 19/75]
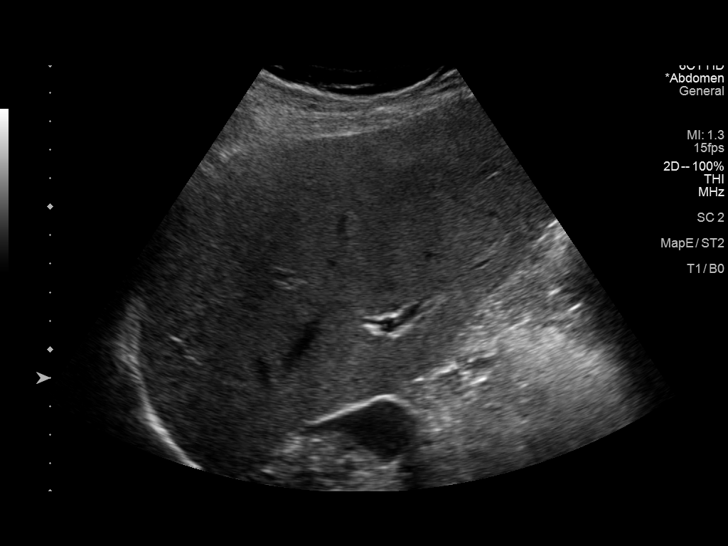
[im 25/75]
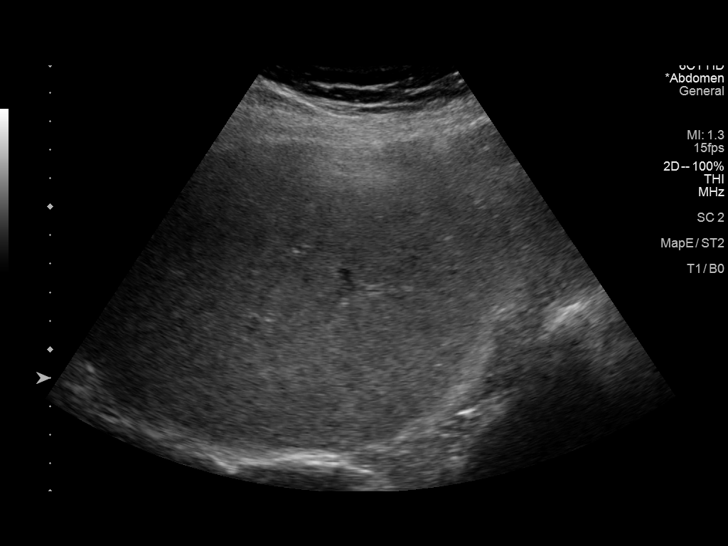
[im 28/75]
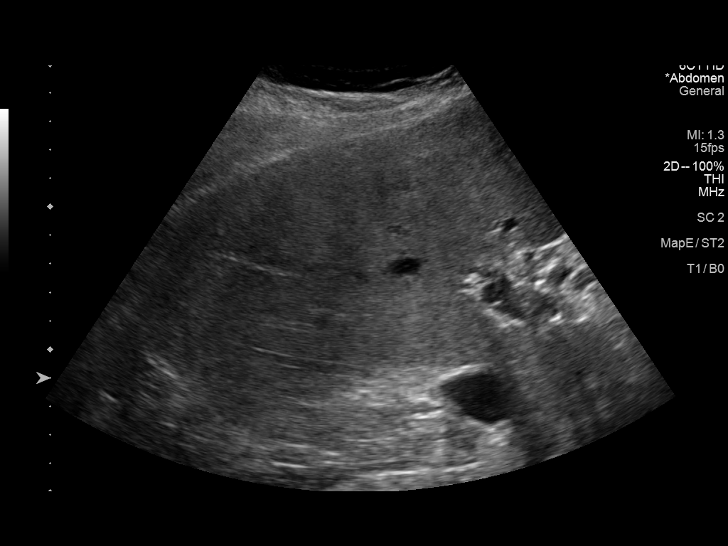
[im 34/75]
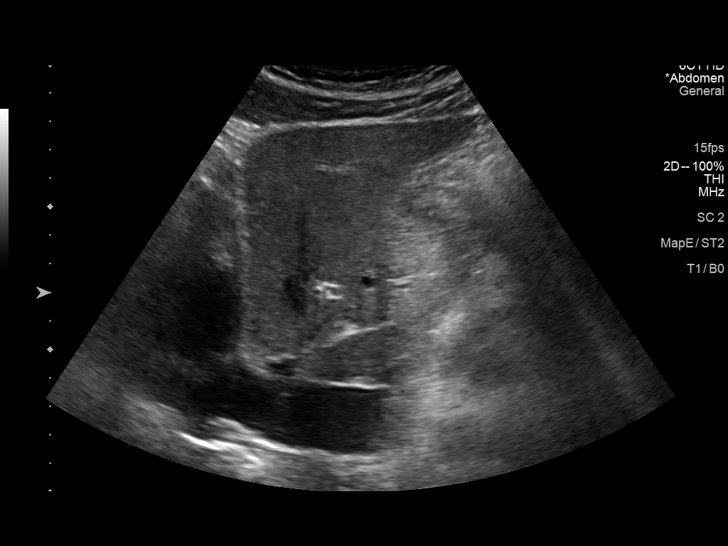
[im 41/75]
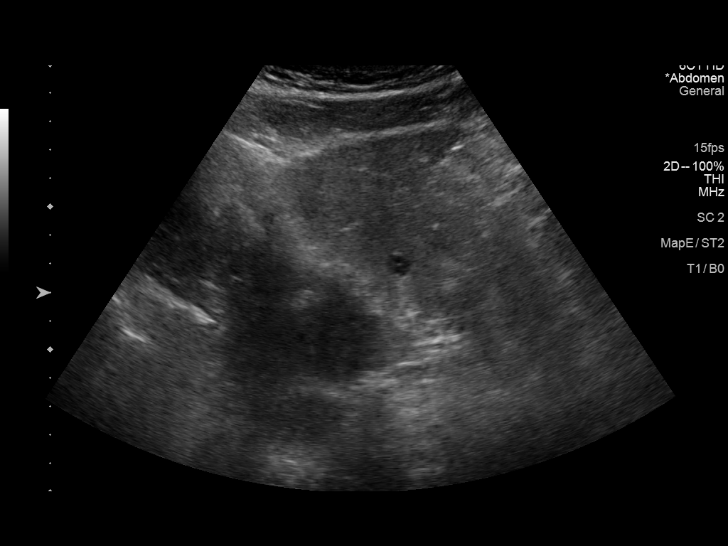
[im 47/75]
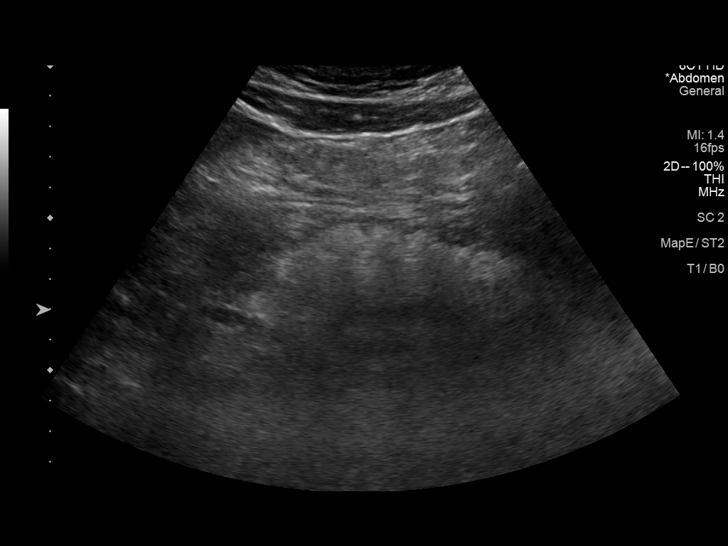
[im 50/75]
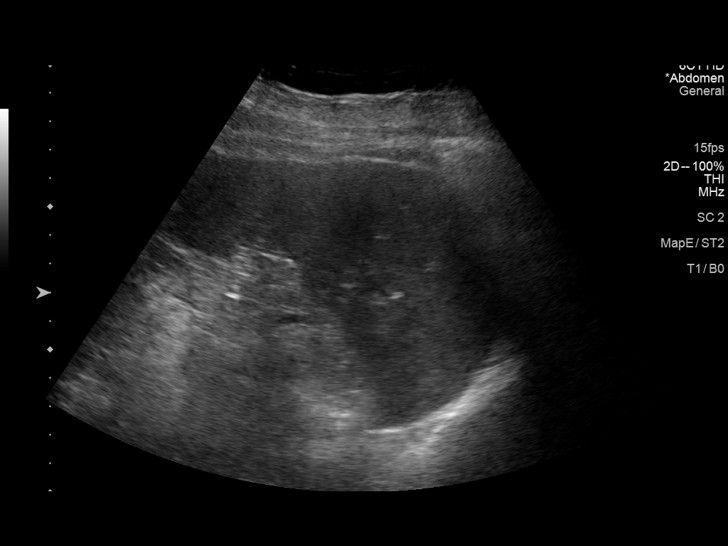
[im 56/75]
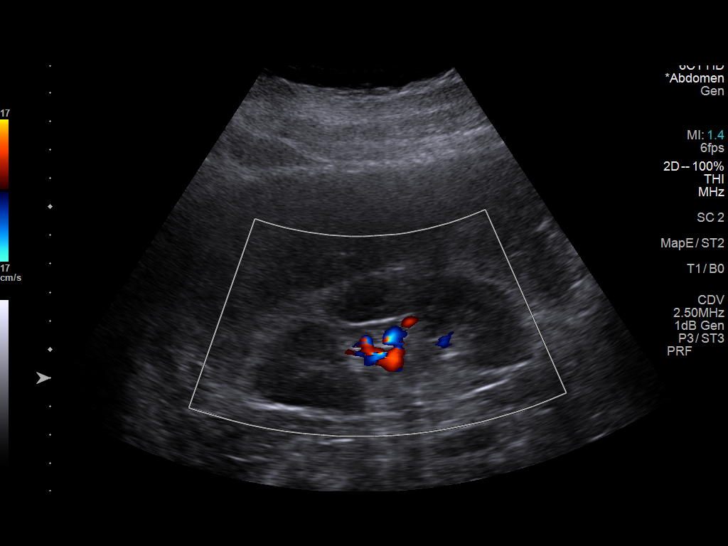
[im 62/75]
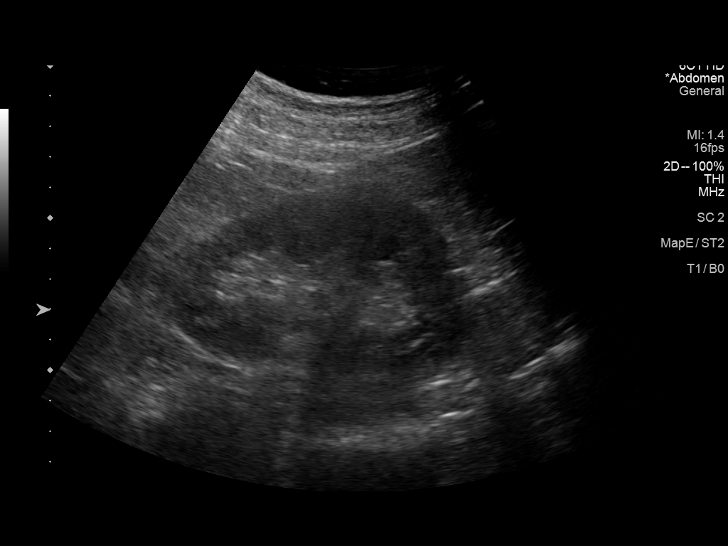
[im 68/75]
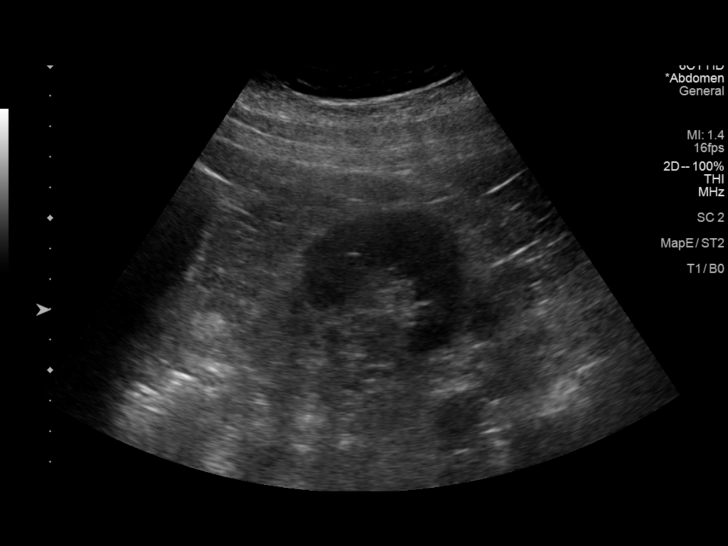
[im 75/75]
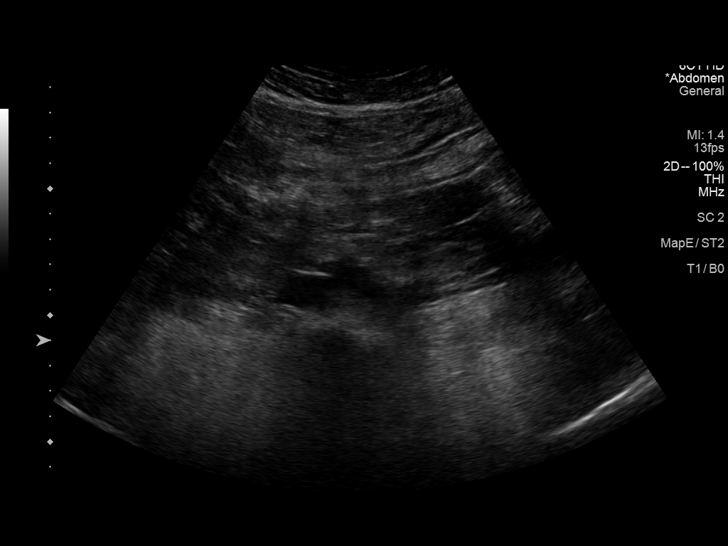

[14 of 25 positions shown; findings below may reference images not displayed]

FINDINGS: Gallbladder: No gallstones or wall thickening visualized. No
sonographic Murphy sign noted by sonographer.

Common bile duct: Diameter: Normal caliber, 5 mm

Liver: Diffusely increased echotexture compatible with fatty
infiltration. 9 mm cyst in the left lobe. No biliary ductal
dilatation. Portal vein is patent on color Doppler imaging with
normal direction of blood flow towards the liver.

IVC: No abnormality visualized.

Pancreas: Visualized portion unremarkable.

Spleen: Size and appearance within normal limits.

Right Kidney: Length: 12.0 cm. Echogenicity within normal limits. No
mass or hydronephrosis visualized.

Left Kidney: Length: 11.1 cm. Echogenicity within normal limits. No
mass or hydronephrosis visualized.

Abdominal aorta: No aneurysm visualized.

Other findings: None.
IMPRESSION: Diffuse fatty infiltration of the liver.

## 2020-06-06 ENCOUNTER — Other Ambulatory Visit: Payer: Self-pay

## 2020-06-06 ENCOUNTER — Telehealth (INDEPENDENT_AMBULATORY_CARE_PROVIDER_SITE_OTHER): Payer: PPO | Admitting: Internal Medicine

## 2020-06-06 ENCOUNTER — Encounter: Payer: Self-pay | Admitting: Internal Medicine

## 2020-06-06 DIAGNOSIS — R059 Cough, unspecified: Secondary | ICD-10-CM | POA: Diagnosis not present

## 2020-06-06 DIAGNOSIS — J4530 Mild persistent asthma, uncomplicated: Secondary | ICD-10-CM | POA: Diagnosis not present

## 2020-06-06 MED ORDER — PROMETHAZINE-CODEINE 6.25-10 MG/5ML PO SYRP
5.0000 mL | ORAL_SOLUTION | ORAL | 0 refills | Status: DC | PRN
Start: 2020-06-06 — End: 2021-01-14

## 2020-06-06 MED ORDER — BENZONATATE 200 MG PO CAPS: 200.0000 mg | ORAL_CAPSULE | Freq: Three times a day (TID) | ORAL | 0 refills | Status: DC | PRN

## 2020-06-06 NOTE — Assessment & Plan Note (Addendum)
Post-URI.  Prescribed cough syrup with codeine and Tessalon as needed. We will use MDI/steroids if the cough is not better soon. Call in 3 to 4 days if not better. Chest x-ray if not better.

## 2020-06-06 NOTE — Assessment & Plan Note (Signed)
We will use MDI/steroids if the cough is not better soon

## 2020-06-06 NOTE — Progress Notes (Signed)
Virtual Visit via Video Note  I connected with Oscar [name] on 06/06/20 at  1:40 PM EST by a video enabled telemedicine application and verified that I am speaking with the correct person using two identifiers.   I discussed the limitations of evaluation and management by telemedicine and the availability of in person appointments. The patient expressed understanding and agreed to proceed.  I was located at our [facility] office. The patient was at home. There was no one else present in the visit.   History of Present Illness: The patient is complaining of persistent spastic cough that bothers him during the day and during the night after he had viral illness in the beginning of January.  It is productive for clear/white mucus.  No fever.  No shortness of breath.  No chest pain  There has been no runny nose, cough, arthralgias, skin rashes.   Observations/Objective: The patient appears to be in no acute distress, looks well.  He is coughing during the video visit  Assessment and Plan:  See my Assessment and Plan. Follow Up Instructions:    I discussed the assessment and treatment plan with the patient. The patient was provided an opportunity to ask questions and all were answered. The patient agreed with the plan and demonstrated an understanding of the instructions.   The patient was advised to call back or seek an in-person evaluation if the symptoms worsen or if the condition fails to improve as anticipated.  I provided face-to-face time during this encounter. We were at different locations.   Walker Kehr, MD

## 2020-06-17 ENCOUNTER — Other Ambulatory Visit: Payer: Self-pay | Admitting: Internal Medicine

## 2020-06-18 NOTE — Telephone Encounter (Signed)
Upon review of chart, Atorvastatin was refilled 06/17/20, pharmacy confirmed receipt at 1730.  Called pt to verify correct pharmacy.  Pt states he received message earlier than 2202 yesterday & will f/u with his pharmacy today for medication.  Pt made aware that he has enough refills for the year.  Pt verb understanding & has no additional ques/concerns at this time.

## 2020-07-16 ENCOUNTER — Other Ambulatory Visit: Payer: Self-pay

## 2020-07-17 ENCOUNTER — Other Ambulatory Visit: Payer: Self-pay

## 2020-07-17 ENCOUNTER — Ambulatory Visit (INDEPENDENT_AMBULATORY_CARE_PROVIDER_SITE_OTHER): Payer: PPO | Admitting: Internal Medicine

## 2020-07-17 ENCOUNTER — Encounter: Payer: Self-pay | Admitting: Internal Medicine

## 2020-07-17 DIAGNOSIS — E785 Hyperlipidemia, unspecified: Secondary | ICD-10-CM | POA: Diagnosis not present

## 2020-07-17 DIAGNOSIS — R413 Other amnesia: Secondary | ICD-10-CM | POA: Diagnosis not present

## 2020-07-17 DIAGNOSIS — R0600 Dyspnea, unspecified: Secondary | ICD-10-CM | POA: Diagnosis not present

## 2020-07-17 DIAGNOSIS — R635 Abnormal weight gain: Secondary | ICD-10-CM | POA: Diagnosis not present

## 2020-07-17 DIAGNOSIS — R972 Elevated prostate specific antigen [PSA]: Secondary | ICD-10-CM

## 2020-07-17 DIAGNOSIS — R0609 Other forms of dyspnea: Secondary | ICD-10-CM

## 2020-07-17 DIAGNOSIS — I10 Essential (primary) hypertension: Secondary | ICD-10-CM

## 2020-07-17 DIAGNOSIS — R739 Hyperglycemia, unspecified: Secondary | ICD-10-CM | POA: Insufficient documentation

## 2020-07-17 LAB — COMPREHENSIVE METABOLIC PANEL
ALT: 44 U/L (ref 0–53)
AST: 30 U/L (ref 0–37)
Albumin: 4.3 g/dL (ref 3.5–5.2)
Alkaline Phosphatase: 65 U/L (ref 39–117)
BUN: 20 mg/dL (ref 6–23)
CO2: 29 mEq/L (ref 19–32)
Calcium: 9.1 mg/dL (ref 8.4–10.5)
Chloride: 100 mEq/L (ref 96–112)
Creatinine, Ser: 0.9 mg/dL (ref 0.40–1.50)
GFR: 81.61 mL/min (ref >60.00)
Glucose, Bld: 84 mg/dL (ref 70–99)
Potassium: 4.3 mEq/L (ref 3.5–5.1)
Sodium: 137 mEq/L (ref 135–145)
Total Bilirubin: 0.6 mg/dL (ref 0.2–1.2)
Total Protein: 6.7 g/dL (ref 6.0–8.3)

## 2020-07-17 LAB — LIPID PANEL
Cholesterol: 164 mg/dL (ref 0–200)
HDL: 56 mg/dL (ref >39.00)
NonHDL: 107.97
Total CHOL/HDL Ratio: 3
Triglycerides: 265 mg/dL — ABNORMAL HIGH (ref 0.0–149.0)
VLDL: 53 mg/dL — ABNORMAL HIGH (ref 0.0–40.0)

## 2020-07-17 LAB — PSA: PSA: 0.75 ng/mL (ref 0.10–4.00)

## 2020-07-17 LAB — LDL CHOLESTEROL, DIRECT: Direct LDL: 78 mg/dL

## 2020-07-17 LAB — TSH: TSH: 1.75 u[IU]/mL (ref 0.35–4.50)

## 2020-07-17 NOTE — Assessment & Plan Note (Signed)
Try to loose wt 

## 2020-07-17 NOTE — Assessment & Plan Note (Signed)
Olmesartan HCT

## 2020-07-17 NOTE — Assessment & Plan Note (Signed)
Try Lion's mane, B complex 

## 2020-07-17 NOTE — Assessment & Plan Note (Signed)
Wt loss discussed 

## 2020-07-17 NOTE — Assessment & Plan Note (Signed)
Lipitor Lipids

## 2020-07-17 NOTE — Addendum Note (Signed)
Addended by: Raliegh Ip on: 07/17/2020 10:17 AM   Modules accepted: Orders

## 2020-07-17 NOTE — Progress Notes (Addendum)
Subjective:  Patient ID: Oscar [name], male    DOB: 12-31-1940  Age: 80 y.o. MRN: [id number]  CC: Follow-up (6 month f/u)   HPI [name] presents for dyslipidemia, SOB, memory issues  Outpatient Medications Prior to Visit  Medication Sig Dispense Refill   ARIPiprazole (ABILIFY) 10 MG tablet Take 10 mg by mouth daily.     aspirin 325 MG tablet Take 325 mg by mouth daily.     atorvastatin (LIPITOR) 40 MG tablet TAKE 1 TABLET(40 MG) BY MOUTH DAILY 90 tablet 3   benzonatate (TESSALON) 200 MG capsule Take 1 capsule (200 mg total) by mouth 3 (three) times daily as needed for cough. 20 capsule 0   Cholecalciferol (RA VITAMIN D-3) 25 MCG (1000 UT) tablet Take 1 tablet (1,000 Units total) by mouth daily. 90 tablet 3   clobetasol (TEMOVATE) 0.05 % external solution Apply 1 application topically 2 (two) times daily. Use for scalp lesions 50 mL 3   desvenlafaxine (PRISTIQ) 100 MG 24 hr tablet      dutasteride (AVODART) 0.5 MG capsule TK 1 C PO QD     famotidine-calcium carbonate-magnesium hydroxide (PEPCID COMPLETE) 10-800-165 MG chewable tablet Chew 1 tablet by mouth daily as needed.     multivitamin-lutein (OCUVITE-LUTEIN) CAPS capsule Take 1 capsule daily by mouth.     mupirocin ointment (BACTROBAN) 2 % On leg wound w/dressing change qd or bid 30 g 0   olmesartan-hydrochlorothiazide (BENICAR HCT) 40-12.5 MG tablet Take 1 tablet by mouth daily. 90 tablet 3   omeprazole (PRILOSEC) 20 MG capsule Take 40 mg by mouth daily.     promethazine-codeine (PHENERGAN WITH CODEINE) 6.25-10 MG/5ML syrup Take 5 mLs by mouth every 4 (four) hours as needed. 300 mL 0   sertraline (ZOLOFT) 100 MG tablet Take 100 mg by mouth daily.     tamsulosin (FLOMAX) 0.4 MG CAPS capsule Take 0.4 mg by mouth daily.     tiZANidine (ZANAFLEX) 4 MG tablet Take 1 tablet (4 mg total) by mouth every 8 (eight) hours as needed for muscle spasms (back pain). 60 tablet 1   doxycycline (VIBRA-TABS) 100 MG tablet Take 1 tablet (100 mg  total) by mouth 2 (two) times daily. 14 tablet 0   No facility-administered medications prior to visit.    ROS: Review of Systems  Constitutional: Negative for appetite change, fatigue and unexpected weight change.  HENT: Negative for congestion, nosebleeds, sneezing, sore throat and trouble swallowing.   Eyes: Negative for itching and visual disturbance.  Respiratory: Negative for cough.   Cardiovascular: Negative for chest pain, palpitations and leg swelling.  Gastrointestinal: Negative for abdominal distention, blood in stool, diarrhea and nausea.  Genitourinary: Negative for frequency and hematuria.  Musculoskeletal: Negative for back pain, gait problem, joint swelling and neck pain.  Skin: Negative for rash.  Neurological: Negative for dizziness, tremors, speech difficulty and weakness.  Psychiatric/Behavioral: Positive for decreased concentration. Negative for agitation, dysphoric mood and sleep disturbance. The patient is not nervous/anxious.     Objective:  There were no vitals taken for this visit.  BP Readings from Last 3 Encounters:  01/15/20 140/66  11/15/19 (!) 154/92  08/10/19 (!) 164/86    Wt Readings from Last 3 Encounters:  01/15/20 234 lb 3.2 oz (106.2 kg)  11/15/19 232 lb (105.2 kg)  08/10/19 239 lb (108.4 kg)    Physical Exam Constitutional:      General: He is not in acute distress.    Appearance: He is well-developed. He is obese.  Comments: NAD  HENT:     Mouth/Throat:     Mouth: Oropharynx is clear and moist.  Eyes:     Conjunctiva/sclera: Conjunctivae normal.     Pupils: Pupils are equal, round, and reactive to light.  Neck:     Thyroid: No thyromegaly.     Vascular: No JVD.  Cardiovascular:     Rate and Rhythm: Normal rate and regular rhythm.     Heart sounds: Normal heart sounds. No murmur heard.   No friction rub. No gallop.  Pulmonary:     Effort: Pulmonary effort is normal. No respiratory distress.     Breath sounds: Normal  breath sounds. No wheezing or rales.  Chest:     Chest wall: No tenderness.  Abdominal:     General: Bowel sounds are normal. There is no distension.     Palpations: Abdomen is soft. There is no mass.     Tenderness: There is no abdominal tenderness. There is no guarding or rebound.  Musculoskeletal:        General: No tenderness or edema. Normal range of motion.     Cervical back: Normal range of motion.  Lymphadenopathy:     Cervical: No cervical adenopathy.  Skin:    General: Skin is warm and dry.     Findings: No rash.  Neurological:     Mental Status: He is alert and oriented to person, place, and time.     Cranial Nerves: No cranial nerve deficit.     Motor: No abnormal muscle tone.     Coordination: Coordination normal.     Gait: Gait normal.     Deep Tendon Reflexes: Reflexes are normal and symmetric.  Psychiatric:        Mood and Affect: Mood and affect normal.        Behavior: Behavior normal.        Thought Content: Thought content normal.        Judgment: Judgment normal.    Lab Results  Component Value Date   WBC 10.0 11/23/2019   HGB 14.4 11/23/2019   HCT 41.5 11/23/2019   PLT 238.0 11/23/2019   GLUCOSE 106 (H) 11/23/2019   CHOL 182 05/11/2019   TRIG 351.0 (H) 05/11/2019   HDL 55.90 05/11/2019   LDLDIRECT 87.0 05/11/2019   LDLCALC 80 11/10/2017   ALT 38 11/23/2019   AST 26 11/23/2019   NA 141 11/23/2019   K 4.0 11/23/2019   CL 103 11/23/2019   CREATININE 0.87 11/23/2019   BUN 15 11/23/2019   CO2 25 11/23/2019   TSH 1.93 11/23/2019   PSA 1.10 11/23/2019   HGBA1C 5.8 05/11/2019    US Abdomen Complete  Result Date: 05/17/2019 CLINICAL DATA:  Nausea EXAM: ABDOMEN ULTRASOUND COMPLETE COMPARISON:  01/03/2010.  CT 04/27/2016 FINDINGS: Gallbladder: No gallstones or wall thickening visualized. No sonographic Murphy sign noted by sonographer. Common bile duct: Diameter: Normal caliber, 5 mm Liver: Diffusely increased echotexture compatible with fatty  infiltration. 9 mm cyst in the left lobe. No biliary ductal dilatation. Portal vein is patent on color Doppler imaging with normal direction of blood flow towards the liver. IVC: No abnormality visualized. Pancreas: Visualized portion unremarkable. Spleen: Size and appearance within normal limits. Right Kidney: Length: 12.0 cm. Echogenicity within normal limits. No mass or hydronephrosis visualized. Left Kidney: Length: 11.1 cm. Echogenicity within normal limits. No mass or hydronephrosis visualized. Abdominal aorta: No aneurysm visualized. Other findings: None. IMPRESSION: Diffuse fatty infiltration of the liver. Electronically Signed  By: Rolm Baptise M.D.   On: 05/17/2019 11:23    Assessment & Plan:     Walker Kehr, MD

## 2020-07-17 NOTE — Assessment & Plan Note (Signed)
Phentermine option was discussed TSH

## 2020-07-17 NOTE — Patient Instructions (Signed)
Phentermine for wt loss     B-complex with Niacin 100 mg    Lion's mane

## 2020-08-26 DIAGNOSIS — L821 Other seborrheic keratosis: Secondary | ICD-10-CM | POA: Diagnosis not present

## 2020-08-26 DIAGNOSIS — L905 Scar conditions and fibrosis of skin: Secondary | ICD-10-CM | POA: Diagnosis not present

## 2020-08-26 DIAGNOSIS — Z85820 Personal history of malignant melanoma of skin: Secondary | ICD-10-CM | POA: Diagnosis not present

## 2020-09-17 ENCOUNTER — Other Ambulatory Visit: Payer: Self-pay | Admitting: Internal Medicine

## 2020-09-18 DIAGNOSIS — R350 Frequency of micturition: Secondary | ICD-10-CM | POA: Diagnosis not present

## 2020-09-18 DIAGNOSIS — N401 Enlarged prostate with lower urinary tract symptoms: Secondary | ICD-10-CM | POA: Diagnosis not present

## 2020-09-27 NOTE — Telephone Encounter (Signed)
Pt states these episodes were not accompanied with SOB or palpitations.  Pt sates over the last few days he's been "doing some work on the patio".  Pt believes he has pulled a muscle, however discomfort was noticed at rest.  Denies discomfort at this time.

## 2020-09-27 NOTE — Telephone Encounter (Signed)
Pt urged to go to urgent care today.  Pt states he already has something planned & can't go today; will go tomorrow.  Pt advised that MD instructions are to be evaluated today & if he has increased chest discomfort he should go to ED.  Pt verb understanding.

## 2020-09-28 DIAGNOSIS — I10 Essential (primary) hypertension: Secondary | ICD-10-CM | POA: Diagnosis not present

## 2020-09-28 DIAGNOSIS — I259 Chronic ischemic heart disease, unspecified: Secondary | ICD-10-CM | POA: Diagnosis not present

## 2020-09-28 DIAGNOSIS — Z6833 Body mass index (BMI) 33.0-33.9, adult: Secondary | ICD-10-CM | POA: Diagnosis not present

## 2020-09-28 DIAGNOSIS — R001 Bradycardia, unspecified: Secondary | ICD-10-CM | POA: Diagnosis not present

## 2020-09-28 DIAGNOSIS — I4430 Unspecified atrioventricular block: Secondary | ICD-10-CM | POA: Diagnosis not present

## 2020-10-11 DIAGNOSIS — R3915 Urgency of urination: Secondary | ICD-10-CM | POA: Diagnosis not present

## 2020-10-14 DIAGNOSIS — R0602 Shortness of breath: Secondary | ICD-10-CM | POA: Diagnosis not present

## 2020-10-17 DIAGNOSIS — N401 Enlarged prostate with lower urinary tract symptoms: Secondary | ICD-10-CM | POA: Diagnosis not present

## 2020-10-17 DIAGNOSIS — R3915 Urgency of urination: Secondary | ICD-10-CM | POA: Diagnosis not present

## 2020-10-22 DIAGNOSIS — R002 Palpitations: Secondary | ICD-10-CM | POA: Diagnosis not present

## 2020-10-28 DIAGNOSIS — R0602 Shortness of breath: Secondary | ICD-10-CM | POA: Diagnosis not present

## 2020-10-28 IMAGING — DX DG CHEST 2V
3 series · 3 of 3 positions shown · non-contrast
Comparison: 02/26/2016

CLINICAL DATA: Unintentional weight loss.

EXAM:
CHEST - 2 VIEW

[chest pa (1 of 2)]
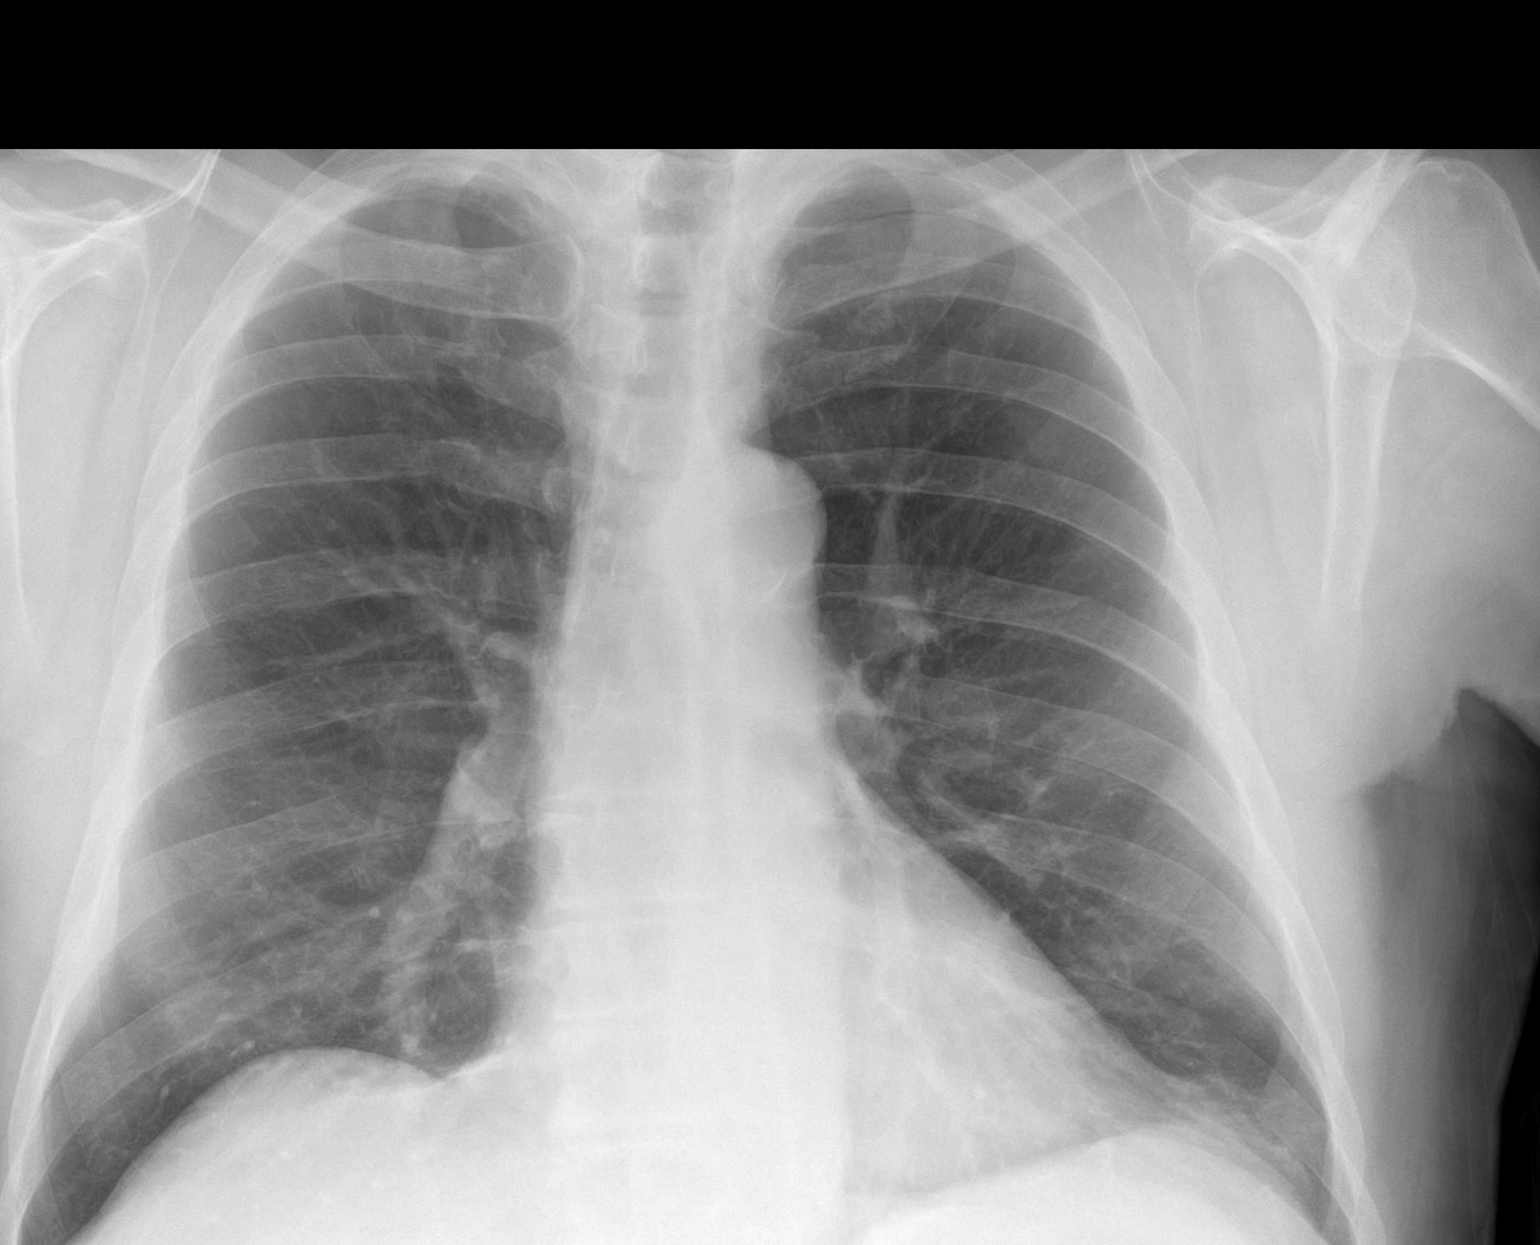

[chest lat]
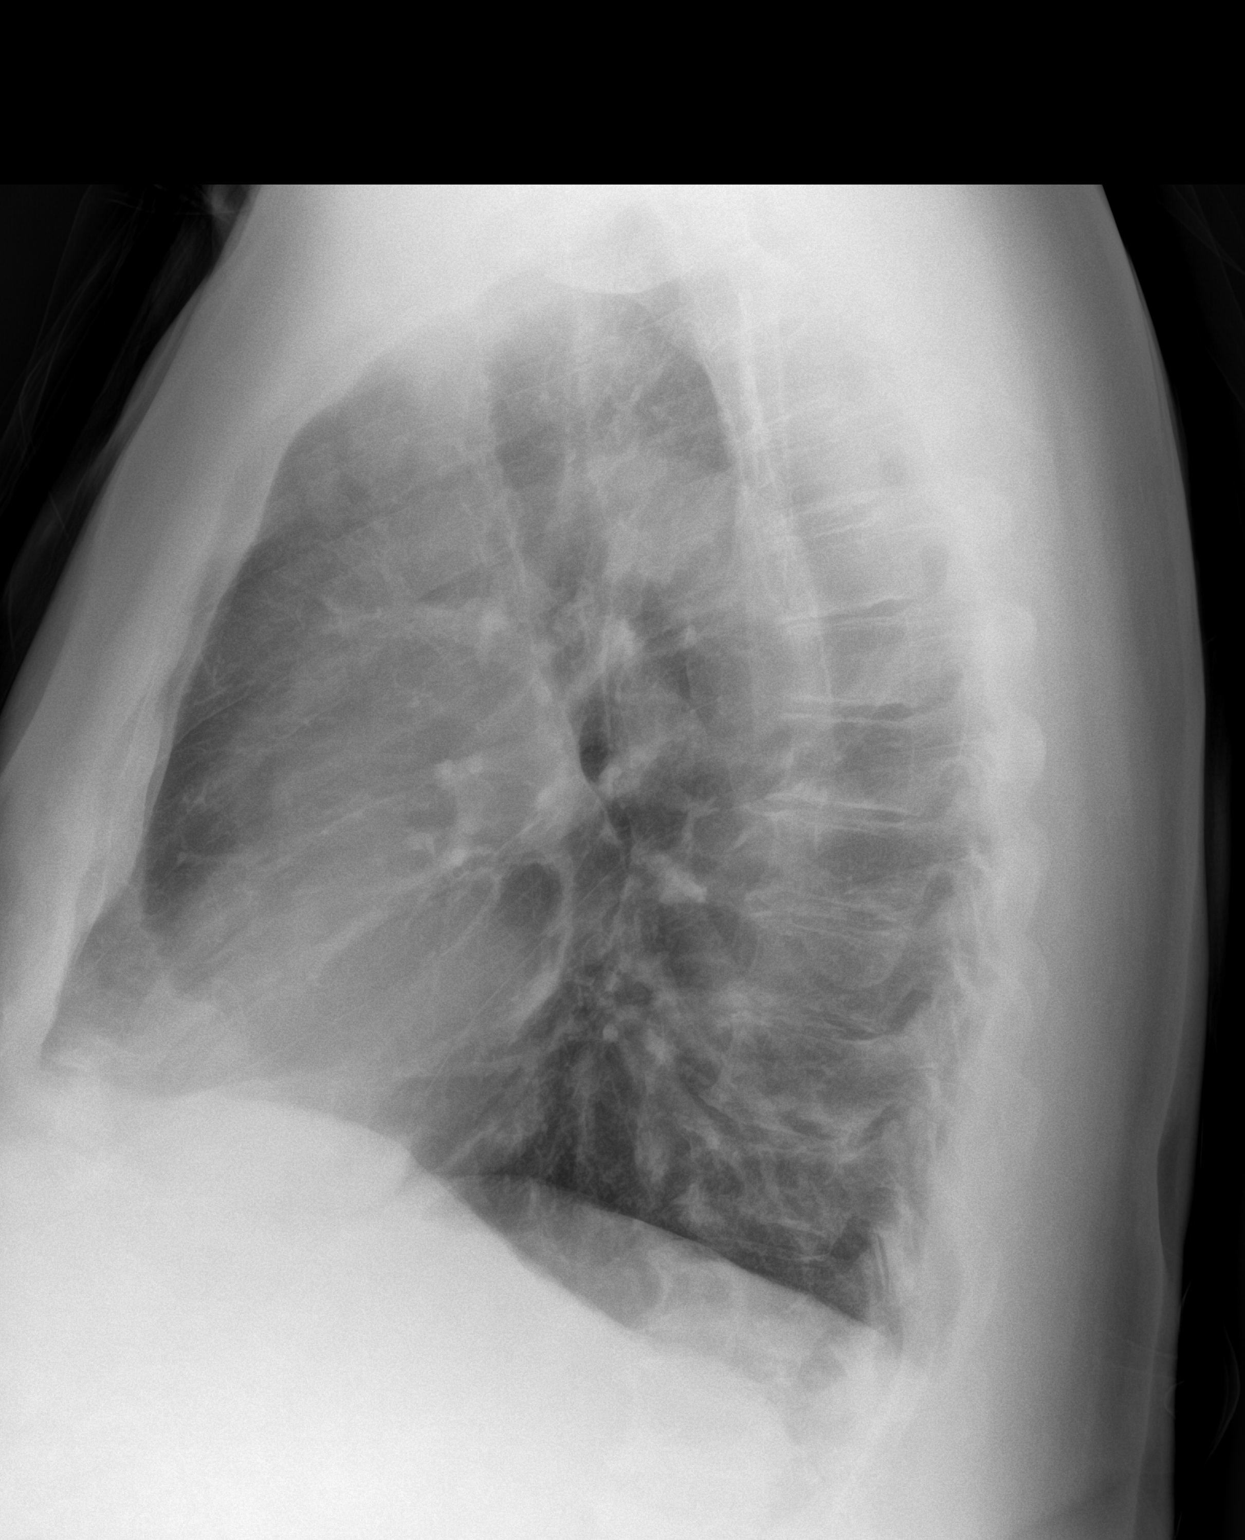

[chest pa (2 of 2)]
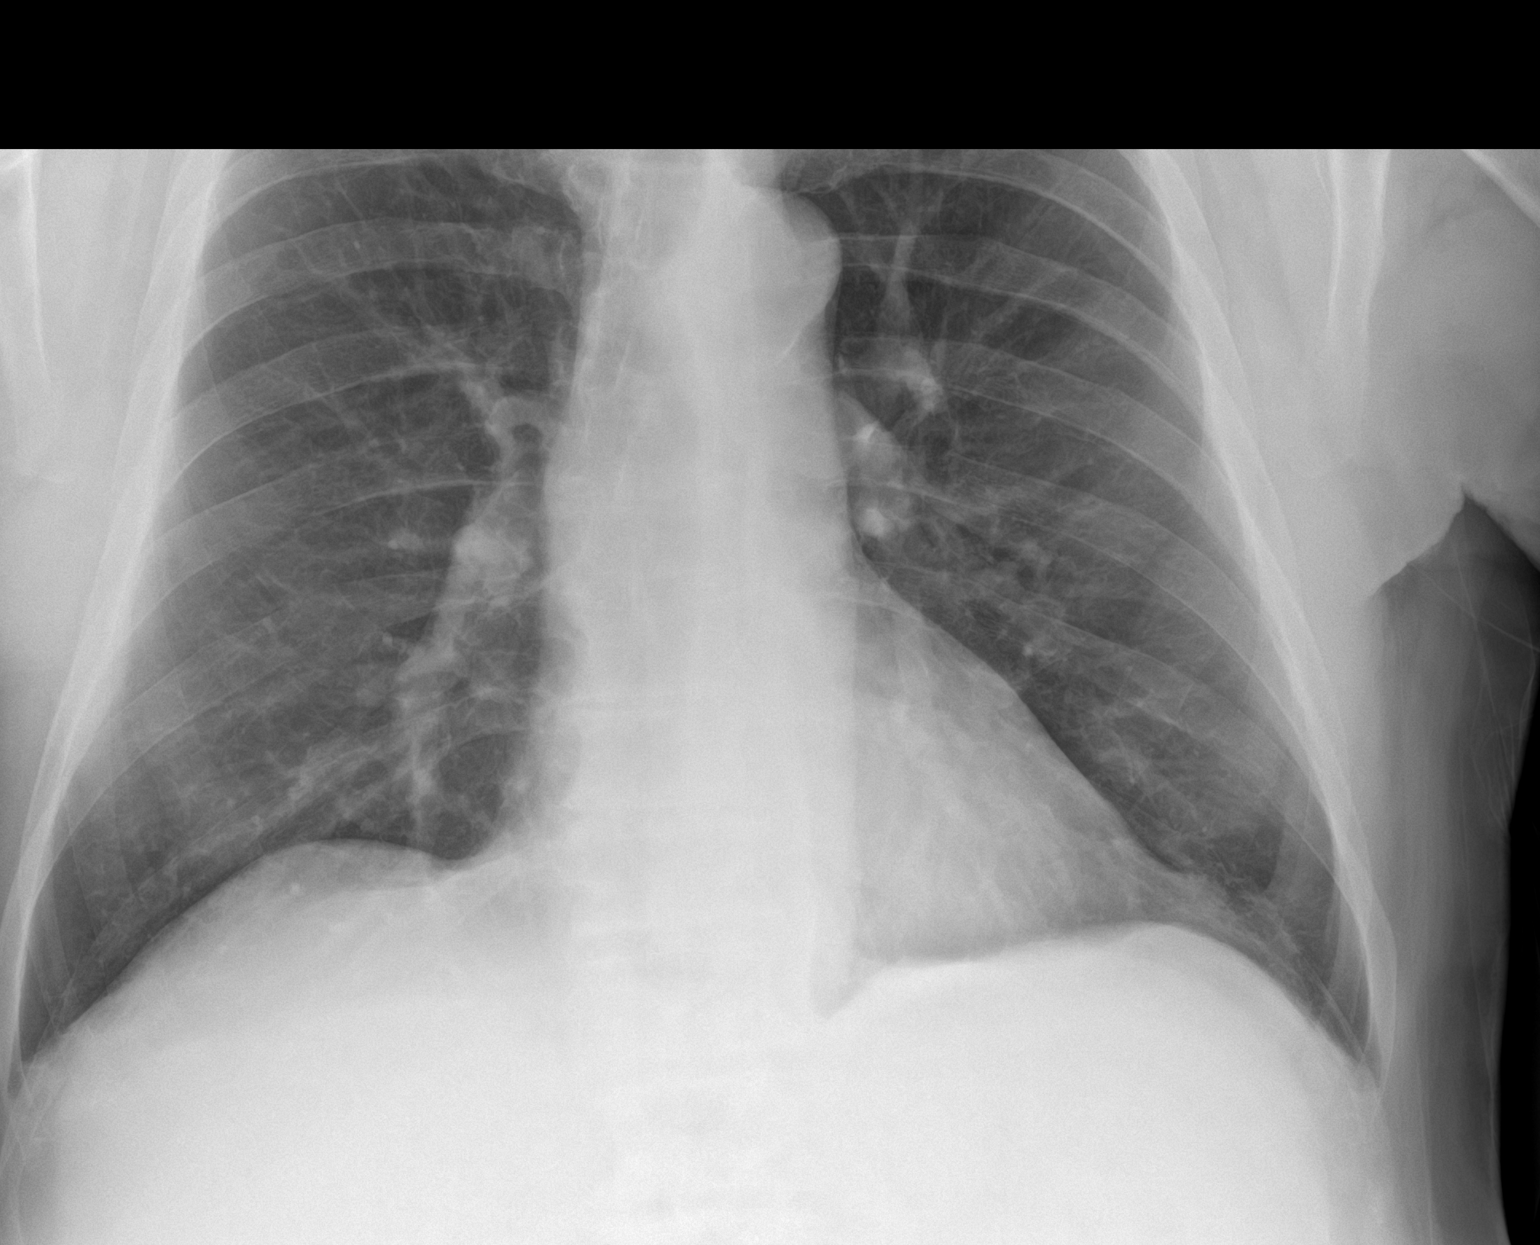

[3 of 3 positions shown; findings below may reference images not displayed]

FINDINGS: Heart size is normal. Ordinary mild aortic atherosclerotic
calcification is noted. Pulmonary vascularity is normal. The lungs
are clear. No effusions. No significant bone finding.
IMPRESSION: No active cardiopulmonary disease. Ordinary aortic atherosclerotic
calcification.

## 2020-11-11 ENCOUNTER — Other Ambulatory Visit: Payer: Self-pay | Admitting: Internal Medicine

## 2020-12-18 DIAGNOSIS — L820 Inflamed seborrheic keratosis: Secondary | ICD-10-CM | POA: Diagnosis not present

## 2020-12-18 DIAGNOSIS — L814 Other melanin hyperpigmentation: Secondary | ICD-10-CM | POA: Diagnosis not present

## 2020-12-19 DIAGNOSIS — R0602 Shortness of breath: Secondary | ICD-10-CM | POA: Diagnosis not present

## 2021-01-14 ENCOUNTER — Ambulatory Visit (INDEPENDENT_AMBULATORY_CARE_PROVIDER_SITE_OTHER): Payer: PPO | Admitting: Internal Medicine

## 2021-01-14 ENCOUNTER — Other Ambulatory Visit: Payer: Self-pay

## 2021-01-14 ENCOUNTER — Encounter: Payer: Self-pay | Admitting: Internal Medicine

## 2021-01-14 DIAGNOSIS — R0600 Dyspnea, unspecified: Secondary | ICD-10-CM | POA: Diagnosis not present

## 2021-01-14 DIAGNOSIS — R0609 Other forms of dyspnea: Secondary | ICD-10-CM

## 2021-01-14 DIAGNOSIS — G8929 Other chronic pain: Secondary | ICD-10-CM | POA: Diagnosis not present

## 2021-01-14 DIAGNOSIS — I10 Essential (primary) hypertension: Secondary | ICD-10-CM | POA: Diagnosis not present

## 2021-01-14 DIAGNOSIS — M5440 Lumbago with sciatica, unspecified side: Secondary | ICD-10-CM

## 2021-01-14 DIAGNOSIS — E785 Hyperlipidemia, unspecified: Secondary | ICD-10-CM

## 2021-01-14 NOTE — Assessment & Plan Note (Signed)
Cont w/Lipitor 

## 2021-01-14 NOTE — Assessment & Plan Note (Signed)
better 

## 2021-01-14 NOTE — Assessment & Plan Note (Signed)
S/p cardiac and pulmonary evaluation - Dr Dione Housekeeper. ECHO was ok. On a beta-blocker now. No change in sx's

## 2021-01-14 NOTE — Progress Notes (Signed)
Subjective:  Patient ID: Oscar [name], male    DOB: Oct 07, 1940  Age: 80 y.o. MRN: [id number]  CC: Follow-up (6 MONTH F/U)   HPI Oscar [name] presents for dyspnea (chronic off and on), anxiety, depression, LBP  Outpatient Medications Prior to Visit  Medication Sig Dispense Refill   ARIPiprazole (ABILIFY) 10 MG tablet Take 10 mg by mouth daily.     aspirin 325 MG tablet Take 325 mg by mouth daily.     atorvastatin (LIPITOR) 40 MG tablet TAKE 1 TABLET(40 MG) BY MOUTH DAILY 90 tablet 3   benzonatate (TESSALON) 200 MG capsule Take 1 capsule (200 mg total) by mouth 3 (three) times daily as needed for cough. 20 capsule 0   Cholecalciferol (RA VITAMIN D-3) 25 MCG (1000 UT) tablet Take 1 tablet (1,000 Units total) by mouth daily. 90 tablet 3   clobetasol (TEMOVATE) 0.05 % external solution Apply 1 application topically 2 (two) times daily. Use for scalp lesions 50 mL 3   desvenlafaxine (PRISTIQ) 100 MG 24 hr tablet      dutasteride (AVODART) 0.5 MG capsule TK 1 C PO QD     famotidine-calcium carbonate-magnesium hydroxide (PEPCID COMPLETE) 10-800-165 MG chewable tablet Chew 1 tablet by mouth daily as needed.     losartan (COZAAR) 100 MG tablet TAKE 1 TABLET(100 MG) BY MOUTH DAILY 90 tablet 3   metoprolol succinate (TOPROL-XL) 25 MG 24 hr tablet Take 12.5 mg by mouth daily. Tale 1/2 tablet by mouth daily     multivitamin-lutein (OCUVITE-LUTEIN) CAPS capsule Take 1 capsule daily by mouth.     olmesartan-hydrochlorothiazide (BENICAR HCT) 40-12.5 MG tablet TAKE 1 TABLET BY MOUTH DAILY 90 tablet 3   omeprazole (PRILOSEC) 20 MG capsule Take 40 mg by mouth daily.     sertraline (ZOLOFT) 100 MG tablet Take 100 mg by mouth daily.     tamsulosin (FLOMAX) 0.4 MG CAPS capsule Take 0.4 mg by mouth daily.     tiZANidine (ZANAFLEX) 4 MG tablet Take 1 tablet (4 mg total) by mouth every 8 (eight) hours as needed for muscle spasms (back pain). 60 tablet 1   mupirocin ointment (BACTROBAN) 2 % On leg wound  w/dressing change qd or bid (Patient not taking: Reported on 01/14/2021) 30 g 0   promethazine-codeine (PHENERGAN WITH CODEINE) 6.25-10 MG/5ML syrup Take 5 mLs by mouth every 4 (four) hours as needed. (Patient not taking: Reported on 01/14/2021) 300 mL 0   No facility-administered medications prior to visit.    ROS: Review of Systems  Constitutional:  Negative for appetite change, fatigue and unexpected weight change.  HENT:  Negative for congestion, nosebleeds, sneezing, sore throat and trouble swallowing.   Eyes:  Negative for itching and visual disturbance.  Respiratory:  Positive for shortness of breath. Negative for cough and wheezing.   Cardiovascular:  Negative for chest pain, palpitations and leg swelling.  Gastrointestinal:  Negative for abdominal distention, blood in stool, diarrhea and nausea.  Genitourinary:  Negative for frequency and hematuria.  Musculoskeletal:  Negative for back pain, gait problem, joint swelling and neck pain.  Skin:  Negative for rash.  Neurological:  Negative for dizziness, tremors, speech difficulty and weakness.  Psychiatric/Behavioral:  Negative for agitation, dysphoric mood and sleep disturbance. The patient is not nervous/anxious.    Objective:  BP 138/60 (BP Location: Left Arm)   Pulse (!) 52   Temp 98.5 F (36.9 C) (Oral)   Ht '6\' 1"'$  (1.854 m)   Wt 232 lb 6.4 oz (105.4  kg)   SpO2 96%   BMI 30.66 kg/m   BP Readings from Last 3 Encounters:  01/14/21 138/60  01/15/20 140/66  11/15/19 (!) 154/92    Wt Readings from Last 3 Encounters:  01/14/21 232 lb 6.4 oz (105.4 kg)  01/15/20 234 lb 3.2 oz (106.2 kg)  11/15/19 232 lb (105.2 kg)    Physical Exam Constitutional:      General: He is not in acute distress.    Appearance: He is well-developed. He is obese.     Comments: NAD  Eyes:     Conjunctiva/sclera: Conjunctivae normal.     Pupils: Pupils are equal, round, and reactive to light.  Neck:     Thyroid: No thyromegaly.      Vascular: No JVD.  Cardiovascular:     Rate and Rhythm: Normal rate and regular rhythm.     Heart sounds: Normal heart sounds. No murmur heard.   No friction rub. No gallop.  Pulmonary:     Effort: Pulmonary effort is normal. No respiratory distress.     Breath sounds: Normal breath sounds. No wheezing or rales.  Chest:     Chest wall: No tenderness.  Abdominal:     General: Bowel sounds are normal. There is no distension.     Palpations: Abdomen is soft. There is no mass.     Tenderness: There is no abdominal tenderness. There is no guarding or rebound.  Musculoskeletal:        General: No tenderness. Normal range of motion.     Cervical back: Normal range of motion.  Lymphadenopathy:     Cervical: No cervical adenopathy.  Skin:    General: Skin is warm and dry.     Findings: No rash.  Neurological:     Mental Status: He is alert and oriented to person, place, and time.     Cranial Nerves: No cranial nerve deficit.     Motor: No abnormal muscle tone.     Coordination: Coordination normal.     Gait: Gait normal.     Deep Tendon Reflexes: Reflexes are normal and symmetric.  Psychiatric:        Behavior: Behavior normal.        Thought Content: Thought content normal.        Judgment: Judgment normal.    Lab Results  Component Value Date   WBC 10.0 11/23/2019   HGB 14.4 11/23/2019   HCT 41.5 11/23/2019   PLT 238.0 11/23/2019   GLUCOSE 84 07/17/2020   CHOL 164 07/17/2020   TRIG 265.0 (H) 07/17/2020   HDL 56.00 07/17/2020   LDLDIRECT 78.0 07/17/2020   LDLCALC 80 11/10/2017   ALT 44 07/17/2020   AST 30 07/17/2020   NA 137 07/17/2020   K 4.3 07/17/2020   CL 100 07/17/2020   CREATININE 0.90 07/17/2020   BUN 20 07/17/2020   CO2 29 07/17/2020   TSH 1.75 07/17/2020   PSA 0.75 07/17/2020   HGBA1C 5.8 05/11/2019    US Abdomen Complete  Result Date: 05/17/2019 CLINICAL DATA:  Nausea EXAM: ABDOMEN ULTRASOUND COMPLETE COMPARISON:  01/03/2010.  CT 04/27/2016 FINDINGS:  Gallbladder: No gallstones or wall thickening visualized. No sonographic Murphy sign noted by sonographer. Common bile duct: Diameter: Normal caliber, 5 mm Liver: Diffusely increased echotexture compatible with fatty infiltration. 9 mm cyst in the left lobe. No biliary ductal dilatation. Portal vein is patent on color Doppler imaging with normal direction of blood flow towards the liver. IVC: No abnormality visualized. Pancreas:  Visualized portion unremarkable. Spleen: Size and appearance within normal limits. Right Kidney: Length: 12.0 cm. Echogenicity within normal limits. No mass or hydronephrosis visualized. Left Kidney: Length: 11.1 cm. Echogenicity within normal limits. No mass or hydronephrosis visualized. Abdominal aorta: No aneurysm visualized. Other findings: None. IMPRESSION: Diffuse fatty infiltration of the liver. Electronically Signed   By: Rolm Baptise M.D.   On: 05/17/2019 11:23    Assessment & Plan:    Walker Kehr, MD

## 2021-01-14 NOTE — Assessment & Plan Note (Signed)
S/p cardiac evaluation - Dr Dione Housekeeper. ECHO was ok. On a beta-blocker now. No change in sx's

## 2021-01-23 DIAGNOSIS — N401 Enlarged prostate with lower urinary tract symptoms: Secondary | ICD-10-CM | POA: Diagnosis not present

## 2021-01-23 DIAGNOSIS — R350 Frequency of micturition: Secondary | ICD-10-CM | POA: Diagnosis not present

## 2021-01-29 ENCOUNTER — Encounter (INDEPENDENT_AMBULATORY_CARE_PROVIDER_SITE_OTHER): Payer: PPO | Admitting: Ophthalmology

## 2021-01-29 ENCOUNTER — Other Ambulatory Visit: Payer: Self-pay

## 2021-01-29 DIAGNOSIS — I10 Essential (primary) hypertension: Secondary | ICD-10-CM | POA: Diagnosis not present

## 2021-01-29 DIAGNOSIS — H353132 Nonexudative age-related macular degeneration, bilateral, intermediate dry stage: Secondary | ICD-10-CM

## 2021-01-29 DIAGNOSIS — H35033 Hypertensive retinopathy, bilateral: Secondary | ICD-10-CM | POA: Diagnosis not present

## 2021-01-29 DIAGNOSIS — H43813 Vitreous degeneration, bilateral: Secondary | ICD-10-CM | POA: Diagnosis not present

## 2021-01-29 DIAGNOSIS — H3554 Dystrophies primarily involving the retinal pigment epithelium: Secondary | ICD-10-CM

## 2021-02-12 ENCOUNTER — Other Ambulatory Visit: Payer: Self-pay

## 2021-02-12 ENCOUNTER — Other Ambulatory Visit (INDEPENDENT_AMBULATORY_CARE_PROVIDER_SITE_OTHER): Payer: PPO

## 2021-02-12 DIAGNOSIS — R0600 Dyspnea, unspecified: Secondary | ICD-10-CM | POA: Diagnosis not present

## 2021-02-12 DIAGNOSIS — R0609 Other forms of dyspnea: Secondary | ICD-10-CM

## 2021-02-12 DIAGNOSIS — E785 Hyperlipidemia, unspecified: Secondary | ICD-10-CM | POA: Diagnosis not present

## 2021-02-12 LAB — COMPREHENSIVE METABOLIC PANEL
ALT: 30 U/L (ref 0–53)
AST: 23 U/L (ref 0–37)
Albumin: 4.2 g/dL (ref 3.5–5.2)
Alkaline Phosphatase: 69 U/L (ref 39–117)
BUN: 16 mg/dL (ref 6–23)
CO2: 28 mEq/L (ref 19–32)
Calcium: 9.1 mg/dL (ref 8.4–10.5)
Chloride: 104 mEq/L (ref 96–112)
Creatinine, Ser: 0.87 mg/dL (ref 0.40–1.50)
GFR: 82.12 mL/min (ref >60.00)
Glucose, Bld: 92 mg/dL (ref 70–99)
Potassium: 4 mEq/L (ref 3.5–5.1)
Sodium: 141 mEq/L (ref 135–145)
Total Bilirubin: 0.6 mg/dL (ref 0.2–1.2)
Total Protein: 7 g/dL (ref 6.0–8.3)

## 2021-02-12 LAB — LIPID PANEL
Cholesterol: 165 mg/dL (ref 0–200)
HDL: 56.4 mg/dL (ref >39.00)
LDL Cholesterol: 76 mg/dL (ref 0–99)
NonHDL: 108.53
Total CHOL/HDL Ratio: 3
Triglycerides: 165 mg/dL — ABNORMAL HIGH (ref 0.0–149.0)
VLDL: 33 mg/dL (ref 0.0–40.0)

## 2021-02-12 LAB — TSH: TSH: 1.97 u[IU]/mL (ref 0.35–5.50)

## 2021-03-26 NOTE — Telephone Encounter (Signed)
Pls advise on email.../lmb 

## 2021-03-27 ENCOUNTER — Other Ambulatory Visit: Payer: Self-pay | Admitting: Internal Medicine

## 2021-03-27 MED ORDER — BUDESONIDE-FORMOTEROL FUMARATE 80-4.5 MCG/ACT IN AERO: 2.0000 | INHALATION_SPRAY | Freq: Two times a day (BID) | RESPIRATORY_TRACT | 11 refills | Status: DC

## 2021-05-01 ENCOUNTER — Other Ambulatory Visit: Payer: Self-pay

## 2021-05-01 ENCOUNTER — Ambulatory Visit (INDEPENDENT_AMBULATORY_CARE_PROVIDER_SITE_OTHER): Payer: PPO

## 2021-05-01 VITALS — BP 128/80 | HR 55 | Temp 98.3°F | Ht 73.0 in | Wt 232.8 lb

## 2021-05-01 DIAGNOSIS — Z0000 Encounter for general adult medical examination without abnormal findings: Secondary | ICD-10-CM

## 2021-05-01 NOTE — Patient Instructions (Signed)
Oscar [name] , Thank you for taking time to come for your Medicare Wellness Visit. I appreciate your ongoing commitment to your health goals. Please review the following plan we discussed and let me know if I can assist you in the future.   Screening recommendations/referrals: Colonoscopy: Last done 10/13/2018; due every 5 years (family history) Recommended yearly ophthalmology/optometry visit for glaucoma screening and checkup Recommended yearly dental visit for hygiene and checkup  Vaccinations: Influenza vaccine: 02/2021 per patient Pneumococcal vaccine: 07/22/2009, 08/15/2013 Tdap vaccine: 08/12/2016; due every 10 years Shingles vaccine: never done   Covid-19: 06/14/2019, 07/03/2019, 03/16/2020  Advanced directives: Please bring a copy of your health care power of attorney and living will to the office at your convenience.  Conditions/risks identified: Yes; Client understands the importance of follow-up with providers by attending scheduled visits and discussed goals to eat healthier, increase physical activity, exercise the brain, socialize more, get enough sleep and make time for laughter.  Next appointment: Please schedule your next Medicare Wellness Visit with your Nurse Health Advisor in 1 year by calling [phone].  Preventive Care 75 Years and Older, Male Preventive care refers to lifestyle choices and visits with your health care provider that can promote health and wellness. What does preventive care include? A yearly physical exam. This is also called an annual well check. Dental exams once or twice a year. Routine eye exams. Ask your health care provider how often you should have your eyes checked. Personal lifestyle choices, including: Daily care of your teeth and gums. Regular physical activity. Eating a healthy diet. Avoiding tobacco and drug use. Limiting alcohol use. Practicing safe sex. Taking low doses of aspirin every day. Taking vitamin and mineral supplements as  recommended by your health care provider. What happens during an annual well check? The services and screenings done by your health care provider during your annual well check will depend on your age, overall health, lifestyle risk factors, and family history of disease. Counseling  Your health care provider may ask you questions about your: Alcohol use. Tobacco use. Drug use. Emotional well-being. Home and relationship well-being. Sexual activity. Eating habits. History of falls. Memory and ability to understand (cognition). Work and work Statistician. Screening  You may have the following tests or measurements: Height, weight, and BMI. Blood pressure. Lipid and cholesterol levels. These may be checked every 5 years, or more frequently if you are over 75 years old. Skin check. Lung cancer screening. You may have this screening every year starting at age 29 if you have a 30-pack-year history of smoking and currently smoke or have quit within the past 15 years. Fecal occult blood test (FOBT) of the stool. You may have this test every year starting at age 15. Flexible sigmoidoscopy or colonoscopy. You may have a sigmoidoscopy every 5 years or a colonoscopy every 10 years starting at age 53. Prostate cancer screening. Recommendations will vary depending on your family history and other risks. Hepatitis C blood test. Hepatitis B blood test. Sexually transmitted disease (STD) testing. Diabetes screening. This is done by checking your blood sugar (glucose) after you have not eaten for a while (fasting). You may have this done every 1-3 years. Abdominal aortic aneurysm (AAA) screening. You may need this if you are a current or former smoker. Osteoporosis. You may be screened starting at age 50 if you are at high risk. Talk with your health care provider about your test results, treatment options, and if necessary, the need for more tests. Vaccines  Your health care provider may recommend  certain vaccines, such as: Influenza vaccine. This is recommended every year. Tetanus, diphtheria, and acellular pertussis (Tdap, Td) vaccine. You may need a Td booster every 10 years. Zoster vaccine. You may need this after age 6. Pneumococcal 13-valent conjugate (PCV13) vaccine. One dose is recommended after age 29. Pneumococcal polysaccharide (PPSV23) vaccine. One dose is recommended after age 22. Talk to your health care provider about which screenings and vaccines you need and how often you need them. This information is not intended to replace advice given to you by your health care provider. Make sure you discuss any questions you have with your health care provider. Document Released: 06/07/2015 Document Revised: 01/29/2016 Document Reviewed: 03/12/2015 Elsevier Interactive Patient Education  2017 [employer] Prevention in the Home Falls can cause injuries. They can happen to people of all ages. There are many things you can do to make your home safe and to help prevent falls. What can I do on the outside of my home? Regularly fix the edges of walkways and driveways and fix any cracks. Remove anything that might make you trip as you walk through a door, such as a raised step or threshold. Trim any bushes or trees on the path to your home. Use bright outdoor lighting. Clear any walking paths of anything that might make someone trip, such as rocks or tools. Regularly check to see if handrails are loose or broken. Make sure that both sides of any steps have handrails. Any raised decks and porches should have guardrails on the edges. Have any leaves, snow, or ice cleared regularly. Use sand or salt on walking paths during winter. Clean up any spills in your garage right away. This includes oil or grease spills. What can I do in the bathroom? Use night lights. Install grab bars by the toilet and in the tub and shower. Do not use towel bars as grab bars. Use non-skid mats or  decals in the tub or shower. If you need to sit down in the shower, use a plastic, non-slip stool. Keep the floor dry. Clean up any water that spills on the floor as soon as it happens. Remove soap buildup in the tub or shower regularly. Attach bath mats securely with double-sided non-slip rug tape. Do not have throw rugs and other things on the floor that can make you trip. What can I do in the bedroom? Use night lights. Make sure that you have a light by your bed that is easy to reach. Do not use any sheets or blankets that are too big for your bed. They should not hang down onto the floor. Have a firm chair that has side arms. You can use this for support while you get dressed. Do not have throw rugs and other things on the floor that can make you trip. What can I do in the kitchen? Clean up any spills right away. Avoid walking on wet floors. Keep items that you use a lot in easy-to-reach places. If you need to reach something above you, use a strong step stool that has a grab bar. Keep electrical cords out of the way. Do not use floor polish or wax that makes floors slippery. If you must use wax, use non-skid floor wax. Do not have throw rugs and other things on the floor that can make you trip. What can I do with my stairs? Do not leave any items on the stairs. Make sure that there are  handrails on both sides of the stairs and use them. Fix handrails that are broken or loose. Make sure that handrails are as long as the stairways. Check any carpeting to make sure that it is firmly attached to the stairs. Fix any carpet that is loose or worn. Avoid having throw rugs at the top or bottom of the stairs. If you do have throw rugs, attach them to the floor with carpet tape. Make sure that you have a light switch at the top of the stairs and the bottom of the stairs. If you do not have them, ask someone to add them for you. What else can I do to help prevent falls? Wear shoes that: Do not  have high heels. Have rubber bottoms. Are comfortable and fit you well. Are closed at the toe. Do not wear sandals. If you use a stepladder: Make sure that it is fully opened. Do not climb a closed stepladder. Make sure that both sides of the stepladder are locked into place. Ask someone to hold it for you, if possible. Clearly mark and make sure that you can see: Any grab bars or handrails. First and last steps. Where the edge of each step is. Use tools that help you move around (mobility aids) if they are needed. These include: Canes. Walkers. Scooters. Crutches. Turn on the lights when you go into a dark area. Replace any light bulbs as soon as they burn out. Set up your furniture so you have a clear path. Avoid moving your furniture around. If any of your floors are uneven, fix them. If there are any pets around you, be aware of where they are. Review your medicines with your doctor. Some medicines can make you feel dizzy. This can increase your chance of falling. Ask your doctor what other things that you can do to help prevent falls. This information is not intended to replace advice given to you by your health care provider. Make sure you discuss any questions you have with your health care provider. Document Released: 03/07/2009 Document Revised: 10/17/2015 Document Reviewed: 06/15/2014 Elsevier Interactive Patient Education  2017 [employer].

## 2021-05-01 NOTE — Progress Notes (Addendum)
Subjective:   Oscar [name] is a 80 y.o. male who presents for Medicare Annual/Subsequent preventive examination.  Review of Systems     Cardiac Risk Factors include: advanced age (>9men, >38 women);dyslipidemia;family history of premature cardiovascular disease;hypertension;male gender;obesity (BMI >30kg/m2)     Objective:    Today's Vitals   05/01/21 1037  BP: 128/80  Pulse: (!) 55  Temp: 98.3 F (36.8 C)  SpO2: 97%  Weight: 232 lb 12.8 oz (105.6 kg)  Height: 6\' 1"  (1.854 m)  PainSc: 0-No pain   Body mass index is 30.71 kg/m.  Advanced Directives 05/01/2021 01/11/2020 06/08/2018 04/02/2017 05/23/2015 01/09/2015  Does Patient Have a Medical Advance Directive? Yes No Yes Yes Yes Yes  Type of Advance Directive Living will;Healthcare Power of [place];Living will [place];Living will [place];Living will -  Does patient want to make changes to medical advance directive? No - Patient declined Yes (MAU/Ambulatory/Procedural Areas - Information given) - - - -  Copy of [place] in Chart? No - copy requested - No - copy requested No - copy requested - Yes    Current Medications (verified) Outpatient Encounter Medications as of 05/01/2021  Medication Sig   ARIPiprazole (ABILIFY) 10 MG tablet Take 10 mg by mouth daily.   aspirin 325 MG tablet Take 325 mg by mouth daily.   atorvastatin (LIPITOR) 40 MG tablet TAKE 1 TABLET(40 MG) BY MOUTH DAILY   benzonatate (TESSALON) 200 MG capsule Take 1 capsule (200 mg total) by mouth 3 (three) times daily as needed for cough.   budesonide-formoterol (SYMBICORT) 80-4.5 MCG/ACT inhaler Inhale 2 puffs into the lungs 2 (two) times daily.   Cholecalciferol (RA VITAMIN D-3) 25 MCG (1000 UT) tablet Take 1 tablet (1,000 Units total) by mouth daily.   clobetasol (TEMOVATE) 0.05 % external solution Apply 1 application topically 2 (two) times daily. Use for scalp lesions    desvenlafaxine (PRISTIQ) 100 MG 24 hr tablet    dutasteride (AVODART) 0.5 MG capsule TK 1 C PO QD   famotidine-calcium carbonate-magnesium hydroxide (PEPCID COMPLETE) 10-800-165 MG chewable tablet Chew 1 tablet by mouth daily as needed.   losartan (COZAAR) 100 MG tablet TAKE 1 TABLET(100 MG) BY MOUTH DAILY   metoprolol succinate (TOPROL-XL) 25 MG 24 hr tablet Take 12.5 mg by mouth daily. Tale 1/2 tablet by mouth daily   multivitamin-lutein (OCUVITE-LUTEIN) CAPS capsule Take 1 capsule daily by mouth.   olmesartan-hydrochlorothiazide (BENICAR HCT) 40-12.5 MG tablet TAKE 1 TABLET BY MOUTH DAILY   omeprazole (PRILOSEC) 20 MG capsule Take 40 mg by mouth daily.   sertraline (ZOLOFT) 100 MG tablet Take 100 mg by mouth daily.   tamsulosin (FLOMAX) 0.4 MG CAPS capsule Take 0.4 mg by mouth daily.   tiZANidine (ZANAFLEX) 4 MG tablet Take 1 tablet (4 mg total) by mouth every 8 (eight) hours as needed for muscle spasms (back pain).   No facility-administered encounter medications on file as of 05/01/2021.    Allergies (verified) Celecoxib, Penicillins, and Rofecoxib   History: Past Medical History:  Diagnosis Date   Adenomatous colon polyp    Allergic rhinitis, cause unspecified    Allergy    Anxiety state, unspecified    Barrett esophagus    BPH (benign prostatic hypertrophy)    BX 2007   Cancer ([place])    melanoma   Cataract    bilaterally removed   Depressive disorder, not elsewhere classified    GERD (gastroesophageal reflux disease)  Hypertension    Hypogonadism male    Microhematuria    Neuromuscular disorder ([place])    small HH 05-26-2012 egd    Other and unspecified hyperlipidemia    Rosacea    UTI (lower urinary tract infection) 2009   Past Surgical History:  Procedure Laterality Date   CATARACT EXTRACTION  05/10/2012   left   COLONOSCOPY  04-22-2010   ESOPHAGOGASTRODUODENOSCOPY  05-26-2012   [facility]   HAND SURGERY Left    MELANOMA EXCISION     POLYPECTOMY     prev colon TA  polyps- no polyps in 2011   PROSTATE BIOPSY  2011   Dr Winifred Olive    UPPER GASTROINTESTINAL ENDOSCOPY     Family History  Problem Relation Age of Onset   Hypertension Mother    Cancer Mother        colon ca   Colon cancer Mother 19   Heart disease Father        MI   Coronary artery disease Other    Stomach cancer Maternal Grandfather    Colon polyps Neg Hx    Esophageal cancer Neg Hx    Rectal cancer Neg Hx    Social History   Socioeconomic History   Marital status: Married    Spouse name: Not on file   Number of children: Not on file   Years of education: Not on file   Highest education level: Not on file  Occupational History   Occupation: [employer] - Retired 2010/june  Tobacco Use   Smoking status: Former    Packs/day: 1.50    Years: 20.00    Pack years: 30.00    Types: Cigarettes    Quit date: 05/25/1978    Years since quitting: 42.9   Smokeless tobacco: Never  Vaping Use   Vaping Use: Never used  Substance and Sexual Activity   Alcohol use: Yes    Alcohol/week: 7.0 standard drinks    Types: 7 Standard drinks or equivalent per week    Comment: 2 drinks per day, wine and mixed drinks   Drug use: No   Sexual activity: Yes  Other Topics Concern   Not on file  Social History Narrative   Married - Seperated 2010, reuniting 2012       Social Determinants of Health   Financial Resource Strain: Low Risk    Difficulty of Paying Living Expenses: Not hard at all  Food Insecurity: No Food Insecurity   Worried About Charity fundraiser in the Last Year: Never true   Arboriculturist in the Last Year: Never true  Transportation Needs: No Transportation Needs   Lack of Transportation (Medical): No   Lack of Transportation (Non-Medical): No  Physical Activity: Sufficiently Active   Days of Exercise per Week: 5 days   Minutes of Exercise per Session: 30 min  Stress: No Stress Concern Present   Feeling of Stress : Not at all  Social Connections: Socially Integrated    Frequency of Communication with Friends and Family: More than three times a week   Frequency of Social Gatherings with Friends and Family: More than three times a week   Attends Religious Services: More than 4 times per year   Active Member of [employer] or Organizations: Yes   Attends Music therapist: More than 4 times per year   Marital Status: Married    Tobacco Counseling Counseling given: Not Answered   Clinical Intake:  Pre-visit preparation completed: Yes  Pain : No/denies  pain Pain Score: 0-No pain     BMI - recorded: 30.71 Nutritional Status: BMI > 30  Obese Nutritional Risks: None Diabetes: No  How often do you need to have someone help you when you read instructions, pamphlets, or other written materials from your doctor or pharmacy?: 1 - Never What is the last grade level you completed in school?: BA Degree  Diabetic? no  Interpreter Needed?: No  Information entered by :: Lisette Abu, LPN   Activities of Daily Living In your present state of health, do you have any difficulty performing the following activities: 05/01/2021  Hearing? N  Vision? N  Difficulty concentrating or making decisions? N  Walking or climbing stairs? N  Dressing or bathing? N  Doing errands, shopping? N  Preparing Food and eating ? N  Using the Toilet? N  In the past six months, have you accidently leaked urine? N  Do you have problems with loss of bowel control? N  Managing your Medications? N  Managing your Finances? N  Housekeeping or managing your Housekeeping? N  Some recent data might be hidden    Patient Care Team: Plotnikov, Evie Lacks, MD as PCP - General Chucky May, MD (Psychiatry) Festus Aloe, MD as Attending Physician (Urology) Tanda Rockers, MD as Attending Physician (Pulmonary Disease) Ladene Artist, MD as Attending Physician (Gastroenterology) Astrid Divine, MD as Referring Physician (Cardiology) Macarthur Critchley, [place] as Referring  Physician (Optometry) Hayden Pedro, MD as Consulting Physician (Ophthalmology)  Indicate any recent Medical Services you may have received from other than [facility] providers in the past year (date may be approximate).     Assessment:   This is a routine wellness examination for Cain.  Hearing/Vision screen Hearing Screening - Comments:: Patient denied any hearing difficulty.   Patient wears hearing aids.  Vision Screening - Comments:: Patient wears corrective glasses/contacts.  Eye exam done annually by: Dr. Macarthur Critchley and Dr. Tempie Hoist (retinal specialist)  Dietary issues and exercise activities discussed: Current Exercise Habits: Home exercise routine, Type of exercise: walking, Time (Minutes): 30, Frequency (Times/Week): 5, Weekly Exercise (Minutes/Week): 150, Intensity: Moderate, Exercise limited by: None identified   Goals Addressed               This Visit's Progress     Patient Stated (pt-stated)        My goal for 2023 is to lose 10 pounds by staying physically active and watching my diet.      Depression Screen PHQ 2/9 Scores 05/01/2021 01/11/2020 06/08/2018 04/02/2017 02/26/2016 01/09/2015 08/20/2014  PHQ - 2 Score 0 0 0 0 0 0 0  PHQ- 9 Score - - 0 0 - - -    Fall Risk Fall Risk  05/01/2021 01/11/2020 11/15/2019 06/08/2018 04/02/2017  Falls in the past year? 0 0 0 0 No  Comment - - - - -  Number falls in past yr: 0 0 0 - -  Injury with Fall? 0 0 0 - -  Risk for fall due to : No Fall Risks No Fall Risks - Impaired balance/gait -  Follow up Falls prevention discussed Falls evaluation completed - Falls prevention discussed -    FALL RISK PREVENTION PERTAINING TO THE HOME:  Any stairs in or around the home? Yes  If so, are there any without handrails? No  Home free of loose throw rugs in walkways, pet beds, electrical cords, etc? Yes  Adequate lighting in your home to reduce risk of falls? Yes  ASSISTIVE DEVICES UTILIZED TO PREVENT FALLS:  Life alert? No  Use of  a cane, walker or w/c? No  Grab bars in the bathroom? No  Shower chair or bench in shower? No  Elevated toilet seat or a handicapped toilet? No   TIMED UP AND GO:  Was the test performed? Yes .  Length of time to ambulate 10 feet: 5 sec.   Gait steady and fast without use of assistive device  Cognitive Function: MMSE - Mini Mental State Exam 06/08/2018 04/02/2017  Orientation to time 5 5  Orientation to Place 5 5  Registration 3 3  Attention/ Calculation 5 5  Recall 3 3  Language- name 2 objects 2 2  Language- repeat 1 1  Language- follow 3 step command 3 3  Language- read & follow direction 1 1  Write a sentence 1 1  Copy design 1 1  Total score 30 30        Immunizations Immunization History  Administered Date(s) Administered   Fluad Quad(high Dose 65+) 02/22/2021   Influenza Split 04/09/2011, 03/01/2012   Influenza Whole 03/19/2009   Influenza, High Dose Seasonal PF 02/26/2016, 01/25/2019   Influenza,inj,Quad PF,6+ Mos 03/16/2013, 02/15/2014, 02/20/2015   Influenza-Unspecified 03/15/2017, 02/06/2018   PFIZER(Purple Top)SARS-COV-2 Vaccination 06/14/2019, 07/03/2019, 03/16/2020   Pneumococcal Conjugate-13 08/15/2013   Pneumococcal Polysaccharide-23 07/22/2009   Td 08/20/2014   Tdap 08/12/2016   Zoster, Live 02/20/2015    TDAP status: Up to date  Flu Vaccine status: Up to date  Pneumococcal vaccine status: Up to date  Covid-19 vaccine status: Completed vaccines  Qualifies for Shingles Vaccine? Yes   Zostavax completed Yes   Shingrix Completed?: No.    Education has been provided regarding the importance of this vaccine. Patient has been advised to call insurance company to determine out of pocket expense if they have not yet received this vaccine. Advised may also receive vaccine at local pharmacy or Health Dept. Verbalized acceptance and understanding.  Screening Tests Health Maintenance  Topic Date Due   Hepatitis C Screening  Never done   Zoster  Vaccines- Shingrix (1 of 2) Never done   COVID-19 Vaccine (4 - Booster for Pfizer series) 05/11/2020   COLONOSCOPY (Pts 45-39yrs Insurance coverage will need to be confirmed)  10/13/2023   TETANUS/TDAP  08/13/2026   Pneumonia Vaccine 68+ Years old  Completed   INFLUENZA VACCINE  Completed   HPV VACCINES  Aged Out    Health Maintenance  Health Maintenance Due  Topic Date Due   Hepatitis C Screening  Never done   Zoster Vaccines- Shingrix (1 of 2) Never done   COVID-19 Vaccine (4 - Booster for Pfizer series) 05/11/2020    Colorectal cancer screening: Type of screening: Colonoscopy. Completed 10/13/2018. Repeat every 5 years  Lung Cancer Screening: (Low Dose CT Chest recommended if Age 60-80 years, 30 pack-year currently smoking OR have quit w/in 15years.) does not qualify.   Lung Cancer Screening Referral: no  Additional Screening:  Hepatitis C Screening: does qualify; Completed no  Vision Screening: Recommended annual ophthalmology exams for early detection of glaucoma and other disorders of the eye. Is the patient up to date with their annual eye exam?  Yes  Who is the provider or what is the name of the office in which the patient attends annual eye exams? Dr. Macarthur Critchley (optometry) and Dr. Tempie Hoist (retinal specialist) If pt is not established with a provider, would they like to be referred to a provider to establish care?  No .   Dental Screening: Recommended annual dental exams for proper oral hygiene  Community Resource Referral / Chronic Care Management: CRR required this visit?  No   CCM required this visit?  No      Plan:     I have personally reviewed and noted the following in the patient's chart:   Medical and social history Use of alcohol, tobacco or illicit drugs  Current medications and supplements including opioid prescriptions. Patient is not currently taking opioid prescriptions. Functional ability and status Nutritional status Physical  activity Advanced directives List of other physicians Hospitalizations, surgeries, and ER visits in previous 12 months Vitals Screenings to include cognitive, depression, and falls Referrals and appointments  In addition, I have reviewed and discussed with patient certain preventive protocols, quality metrics, and best practice recommendations. A written personalized care plan for preventive services as well as general preventive health recommendations were provided to patient.     Sheral Flow, LPN   54/10/2701   Nurse Notes:  Hearing Screening - Comments:: Patient denied any hearing difficulty.   Patient wears hearing aids.  Vision Screening - Comments:: Patient wears corrective glasses/contacts.  Eye exam done annually by: Dr. Macarthur Critchley and Dr. Tempie Hoist (retinal specialist)   Medical screening examination/treatment/procedure(s) were performed by non-physician practitioner and as supervising physician I was immediately available for consultation/collaboration.  I agree with above. Lew Dawes, MD

## 2021-05-30 DIAGNOSIS — L821 Other seborrheic keratosis: Secondary | ICD-10-CM | POA: Diagnosis not present

## 2021-05-30 DIAGNOSIS — Z85820 Personal history of malignant melanoma of skin: Secondary | ICD-10-CM | POA: Diagnosis not present

## 2021-05-30 DIAGNOSIS — C4361 Malignant melanoma of right upper limb, including shoulder: Secondary | ICD-10-CM | POA: Diagnosis not present

## 2021-05-30 DIAGNOSIS — L918 Other hypertrophic disorders of the skin: Secondary | ICD-10-CM | POA: Diagnosis not present

## 2021-05-30 DIAGNOSIS — L538 Other specified erythematous conditions: Secondary | ICD-10-CM | POA: Diagnosis not present

## 2021-05-30 DIAGNOSIS — R208 Other disturbances of skin sensation: Secondary | ICD-10-CM | POA: Diagnosis not present

## 2021-05-30 DIAGNOSIS — Z08 Encounter for follow-up examination after completed treatment for malignant neoplasm: Secondary | ICD-10-CM | POA: Diagnosis not present

## 2021-05-30 DIAGNOSIS — R229 Localized swelling, mass and lump, unspecified: Secondary | ICD-10-CM | POA: Diagnosis not present

## 2021-05-30 DIAGNOSIS — Z789 Other specified health status: Secondary | ICD-10-CM | POA: Diagnosis not present

## 2021-06-04 ENCOUNTER — Other Ambulatory Visit: Payer: Self-pay | Admitting: Internal Medicine

## 2021-06-19 ENCOUNTER — Encounter: Payer: Self-pay | Admitting: Internal Medicine

## 2021-06-19 ENCOUNTER — Other Ambulatory Visit: Payer: Self-pay

## 2021-06-19 ENCOUNTER — Ambulatory Visit (INDEPENDENT_AMBULATORY_CARE_PROVIDER_SITE_OTHER): Payer: PPO | Admitting: Internal Medicine

## 2021-06-19 VITALS — BP 138/64 | HR 56 | Temp 98.8°F | Ht 73.0 in | Wt 229.6 lb

## 2021-06-19 DIAGNOSIS — R634 Abnormal weight loss: Secondary | ICD-10-CM

## 2021-06-19 DIAGNOSIS — R0609 Other forms of dyspnea: Secondary | ICD-10-CM | POA: Diagnosis not present

## 2021-06-19 DIAGNOSIS — R5383 Other fatigue: Secondary | ICD-10-CM | POA: Diagnosis not present

## 2021-06-19 DIAGNOSIS — J4530 Mild persistent asthma, uncomplicated: Secondary | ICD-10-CM

## 2021-06-19 NOTE — Patient Instructions (Signed)
Wt Readings from Last 3 Encounters:  06/19/21 229 lb 9.6 oz (104.1 kg)  05/01/21 232 lb 12.8 oz (105.6 kg)  01/14/21 232 lb 6.4 oz (105.4 kg)

## 2021-06-19 NOTE — Progress Notes (Signed)
Subjective:  Patient ID: Oscar [name], male    DOB: [birth year]  Age: 81 y.o. MRN: [id number]  CC: Weight Loss   HPI Oscar [name] presents for wt loss of 6 lbs in 3 weeks  Outpatient Medications Prior to Visit  Medication Sig Dispense Refill   ARIPiprazole (ABILIFY) 10 MG tablet Take 10 mg by mouth daily.     aspirin 325 MG tablet Take 325 mg by mouth daily.     atorvastatin (LIPITOR) 40 MG tablet TAKE 1 TABLET(40 MG) BY MOUTH DAILY 90 tablet 3   benzonatate (TESSALON) 200 MG capsule Take 1 capsule (200 mg total) by mouth 3 (three) times daily as needed for cough. 20 capsule 0   budesonide-formoterol (SYMBICORT) 80-4.5 MCG/ACT inhaler Inhale 2 puffs into the lungs 2 (two) times daily. 1 each 11   Cholecalciferol (RA VITAMIN D-3) 25 MCG (1000 UT) tablet Take 1 tablet (1,000 Units total) by mouth daily. 90 tablet 3   clobetasol (TEMOVATE) 0.05 % external solution Apply 1 application topically 2 (two) times daily. Use for scalp lesions 50 mL 3   desvenlafaxine (PRISTIQ) 100 MG 24 hr tablet      dutasteride (AVODART) 0.5 MG capsule TK 1 C PO QD     famotidine-calcium carbonate-magnesium hydroxide (PEPCID COMPLETE) 10-800-165 MG chewable tablet Chew 1 tablet by mouth daily as needed.     losartan (COZAAR) 100 MG tablet TAKE 1 TABLET(100 MG) BY MOUTH DAILY 90 tablet 3   metoprolol succinate (TOPROL-XL) 25 MG 24 hr tablet Take 12.5 mg by mouth daily. Tale 1/2 tablet by mouth daily     multivitamin-lutein (OCUVITE-LUTEIN) CAPS capsule Take 1 capsule daily by mouth.     olmesartan-hydrochlorothiazide (BENICAR HCT) 40-12.5 MG tablet TAKE 1 TABLET BY MOUTH DAILY 90 tablet 3   omeprazole (PRILOSEC) 20 MG capsule Take 40 mg by mouth daily.     sertraline (ZOLOFT) 100 MG tablet Take 100 mg by mouth daily.     tamsulosin (FLOMAX) 0.4 MG CAPS capsule Take 0.4 mg by mouth daily.     tiZANidine (ZANAFLEX) 4 MG tablet Take 1 tablet (4 mg total) by mouth every 8 (eight) hours as needed for muscle spasms  (back pain). 60 tablet 1   No facility-administered medications prior to visit.    ROS: Review of Systems  Constitutional:  Positive for fatigue and unexpected weight change. Negative for appetite change.  HENT:  Negative for congestion, nosebleeds, sneezing, sore throat and trouble swallowing.   Eyes:  Negative for itching and visual disturbance.  Respiratory:  Positive for shortness of breath. Negative for cough.   Cardiovascular:  Negative for chest pain, palpitations and leg swelling.  Gastrointestinal:  Negative for abdominal distention, blood in stool, diarrhea and nausea.  Genitourinary:  Negative for frequency and hematuria.  Musculoskeletal:  Positive for back pain. Negative for gait problem, joint swelling and neck pain.  Skin:  Negative for rash.  Neurological:  Negative for dizziness, tremors, speech difficulty and weakness.  Psychiatric/Behavioral:  Negative for agitation, dysphoric mood and sleep disturbance. The patient is not nervous/anxious.    Objective:  BP 138/64 (BP Location: Left Arm)    Pulse (!) 56    Temp 98.8 F (37.1 C) (Oral)    Ht 6\' 1"  (1.854 m)    Wt 229 lb 9.6 oz (104.1 kg)    SpO2 97%    BMI 30.29 kg/m   BP Readings from Last 3 Encounters:  06/19/21 138/64  05/01/21 128/80  01/14/21 138/60  Wt Readings from Last 3 Encounters:  06/19/21 229 lb 9.6 oz (104.1 kg)  05/01/21 232 lb 12.8 oz (105.6 kg)  01/14/21 232 lb 6.4 oz (105.4 kg)    Physical Exam Constitutional:      General: He is not in acute distress.    Appearance: He is well-developed. He is obese. He is not ill-appearing.     Comments: NAD  Eyes:     Conjunctiva/sclera: Conjunctivae normal.     Pupils: Pupils are equal, round, and reactive to light.  Neck:     Thyroid: No thyromegaly.     Vascular: No JVD.  Cardiovascular:     Rate and Rhythm: Normal rate and regular rhythm.     Heart sounds: Normal heart sounds. No murmur heard.   No friction rub. No gallop.  Pulmonary:      Effort: Pulmonary effort is normal. No respiratory distress.     Breath sounds: Normal breath sounds. No wheezing or rales.  Chest:     Chest wall: No tenderness.  Abdominal:     General: Bowel sounds are normal. There is no distension.     Palpations: Abdomen is soft. There is no mass.     Tenderness: There is no abdominal tenderness. There is no guarding or rebound.  Musculoskeletal:        General: No tenderness. Normal range of motion.     Cervical back: Normal range of motion.     Right lower leg: No edema.     Left lower leg: No edema.  Lymphadenopathy:     Cervical: No cervical adenopathy.  Skin:    General: Skin is warm and dry.     Findings: No rash.  Neurological:     Mental Status: He is alert and oriented to person, place, and time.     Cranial Nerves: No cranial nerve deficit.     Motor: No abnormal muscle tone.     Coordination: Coordination normal.     Gait: Gait normal.     Deep Tendon Reflexes: Reflexes are normal and symmetric.  Psychiatric:        Behavior: Behavior normal.        Thought Content: Thought content normal.        Judgment: Judgment normal.    Lab Results  Component Value Date   WBC 10.0 11/23/2019   HGB 14.4 11/23/2019   HCT 41.5 11/23/2019   PLT 238.0 11/23/2019   GLUCOSE 92 02/12/2021   CHOL 165 02/12/2021   TRIG 165.0 (H) 02/12/2021   HDL 56.40 02/12/2021   LDLDIRECT 78.0 07/17/2020   LDLCALC 76 02/12/2021   ALT 30 02/12/2021   AST 23 02/12/2021   NA 141 02/12/2021   K 4.0 02/12/2021   CL 104 02/12/2021   CREATININE 0.87 02/12/2021   BUN 16 02/12/2021   CO2 28 02/12/2021   TSH 1.97 02/12/2021   PSA 0.75 07/17/2020   HGBA1C 5.8 05/11/2019    US Abdomen Complete  Result Date: 05/17/2019 CLINICAL DATA:  Nausea EXAM: ABDOMEN ULTRASOUND COMPLETE COMPARISON:  01/03/2010.  CT 04/27/2016 FINDINGS: Gallbladder: No gallstones or wall thickening visualized. No sonographic Murphy sign noted by sonographer. Common bile duct: Diameter:  Normal caliber, 5 mm Liver: Diffusely increased echotexture compatible with fatty infiltration. 9 mm cyst in the left lobe. No biliary ductal dilatation. Portal vein is patent on color Doppler imaging with normal direction of blood flow towards the liver. IVC: No abnormality visualized. Pancreas: Visualized portion unremarkable. Spleen: Size and appearance  within normal limits. Right Kidney: Length: 12.0 cm. Echogenicity within normal limits. No mass or hydronephrosis visualized. Left Kidney: Length: 11.1 cm. Echogenicity within normal limits. No mass or hydronephrosis visualized. Abdominal aorta: No aneurysm visualized. Other findings: None. IMPRESSION: Diffuse fatty infiltration of the liver. Electronically Signed   By: Rolm Baptise M.D.   On: 05/17/2019 11:23    Assessment & Plan:   Problem List Items Addressed This Visit     Chronic asthma, mild persistent, uncomplicated    No change.      DOE (dyspnea on exertion)    Chronic.  Multifactorial.  Unchanged.  We will continue to monitor.      Fatigue    No change.  Return to clinic in 6 weeks.      Weight loss - Primary    Unintentional wt loss of 6 lbs in 3 weeks RTC 6 wks w/labs      Relevant Orders   CBC with Differential/Platelet   Comprehensive metabolic panel   T4, free   TSH   Urinalysis      No orders of the defined types were placed in this encounter.     Follow-up: Return in about 6 weeks (around 07/31/2021) for a follow-up visit.  Walker Kehr, MD

## 2021-06-19 NOTE — Assessment & Plan Note (Signed)
Unintentional wt loss of 6 lbs in 3 weeks RTC 6 wks w/labs

## 2021-06-30 NOTE — Assessment & Plan Note (Signed)
Chronic.  Multifactorial.  Unchanged.  We will continue to monitor.

## 2021-06-30 NOTE — Assessment & Plan Note (Signed)
No change 

## 2021-06-30 NOTE — Assessment & Plan Note (Signed)
No change.  Return to clinic in 6 weeks.

## 2021-07-22 ENCOUNTER — Ambulatory Visit: Payer: PPO | Admitting: Internal Medicine

## 2021-08-05 ENCOUNTER — Encounter: Payer: Self-pay | Admitting: Internal Medicine

## 2021-08-05 ENCOUNTER — Other Ambulatory Visit: Payer: Self-pay

## 2021-08-05 ENCOUNTER — Ambulatory Visit (INDEPENDENT_AMBULATORY_CARE_PROVIDER_SITE_OTHER): Payer: PPO | Admitting: Internal Medicine

## 2021-08-05 DIAGNOSIS — R0609 Other forms of dyspnea: Secondary | ICD-10-CM

## 2021-08-05 DIAGNOSIS — E785 Hyperlipidemia, unspecified: Secondary | ICD-10-CM | POA: Diagnosis not present

## 2021-08-05 DIAGNOSIS — I10 Essential (primary) hypertension: Secondary | ICD-10-CM

## 2021-08-05 DIAGNOSIS — J4530 Mild persistent asthma, uncomplicated: Secondary | ICD-10-CM

## 2021-08-05 DIAGNOSIS — K2270 Barrett's esophagus without dysplasia: Secondary | ICD-10-CM | POA: Diagnosis not present

## 2021-08-05 DIAGNOSIS — R634 Abnormal weight loss: Secondary | ICD-10-CM

## 2021-08-05 DIAGNOSIS — R5383 Other fatigue: Secondary | ICD-10-CM | POA: Diagnosis not present

## 2021-08-05 LAB — URINALYSIS
Bilirubin Urine: NEGATIVE
Hgb urine dipstick: NEGATIVE
Ketones, ur: NEGATIVE
Leukocytes,Ua: NEGATIVE
Nitrite: NEGATIVE
Specific Gravity, Urine: 1.01 (ref 1.000–1.030)
Total Protein, Urine: NEGATIVE
Urine Glucose: NEGATIVE
Urobilinogen, UA: 0.2 (ref 0.0–1.0)
pH: 7.5 (ref 5.0–8.0)

## 2021-08-05 LAB — CBC WITH DIFFERENTIAL/PLATELET
Basophils Absolute: 0 10*3/uL (ref 0.0–0.1)
Basophils Relative: 0.2 % (ref 0.0–3.0)
Eosinophils Absolute: 0.2 10*3/uL (ref 0.0–0.7)
Eosinophils Relative: 2.1 % (ref 0.0–5.0)
HCT: 41.3 % (ref 39.0–52.0)
Hemoglobin: 13.9 g/dL (ref 13.0–17.0)
Lymphocytes Relative: 25.3 % (ref 12.0–46.0)
Lymphs Abs: 2.3 10*3/uL (ref 0.7–4.0)
MCHC: 33.6 g/dL (ref 30.0–36.0)
MCV: 89.6 fl (ref 78.0–100.0)
Monocytes Absolute: 0.8 10*3/uL (ref 0.1–1.0)
Monocytes Relative: 9.1 % (ref 3.0–12.0)
Neutro Abs: 5.8 10*3/uL (ref 1.4–7.7)
Neutrophils Relative %: 63.3 % (ref 43.0–77.0)
Platelets: 215 10*3/uL (ref 150.0–400.0)
RBC: 4.61 Mil/uL (ref 4.22–5.81)
RDW: 13.4 % (ref 11.5–15.5)
WBC: 9.2 10*3/uL (ref 4.0–10.5)

## 2021-08-05 LAB — COMPREHENSIVE METABOLIC PANEL
ALT: 32 U/L (ref 0–53)
AST: 25 U/L (ref 0–37)
Albumin: 4.5 g/dL (ref 3.5–5.2)
Alkaline Phosphatase: 69 U/L (ref 39–117)
BUN: 16 mg/dL (ref 6–23)
CO2: 28 mEq/L (ref 19–32)
Calcium: 9.4 mg/dL (ref 8.4–10.5)
Chloride: 100 mEq/L (ref 96–112)
Creatinine, Ser: 0.85 mg/dL (ref 0.40–1.50)
GFR: 82.42 mL/min (ref >60.00)
Glucose, Bld: 88 mg/dL (ref 70–99)
Potassium: 3.9 mEq/L (ref 3.5–5.1)
Sodium: 138 mEq/L (ref 135–145)
Total Bilirubin: 0.7 mg/dL (ref 0.2–1.2)
Total Protein: 6.9 g/dL (ref 6.0–8.3)

## 2021-08-05 LAB — TSH: TSH: 1.87 u[IU]/mL (ref 0.35–5.50)

## 2021-08-05 LAB — T4, FREE: Free T4: 0.79 ng/dL (ref 0.60–1.60)

## 2021-08-05 NOTE — Assessment & Plan Note (Signed)
Chronic. No change ?

## 2021-08-05 NOTE — Progress Notes (Signed)
? ?Subjective:  ?Patient ID: Oscar [name], male    DOB: [birth year]  Age: 81 y.o. MRN: [id number] ? ?CC: Follow-up (No concerns) ? ? ?HPI ?[name] presents for wt loss - resolved, GERD, HTN,chronic DOE ? ? ? ?Outpatient Medications Prior to Visit  ?Medication Sig Dispense Refill  ? ARIPiprazole (ABILIFY) 10 MG tablet Take 10 mg by mouth daily.    ? aspirin 325 MG tablet Take 325 mg by mouth daily.    ? atorvastatin (LIPITOR) 40 MG tablet TAKE 1 TABLET(40 MG) BY MOUTH DAILY 90 tablet 3  ? benzonatate (TESSALON) 200 MG capsule Take 1 capsule (200 mg total) by mouth 3 (three) times daily as needed for cough. 20 capsule 0  ? budesonide-formoterol (SYMBICORT) 80-4.5 MCG/ACT inhaler Inhale 2 puffs into the lungs 2 (two) times daily. 1 each 11  ? Cholecalciferol (RA VITAMIN D-3) 25 MCG (1000 UT) tablet Take 1 tablet (1,000 Units total) by mouth daily. 90 tablet 3  ? clobetasol (TEMOVATE) 0.05 % external solution Apply 1 application topically 2 (two) times daily. Use for scalp lesions 50 mL 3  ? desvenlafaxine (PRISTIQ) 100 MG 24 hr tablet     ? dutasteride (AVODART) 0.5 MG capsule TK 1 C PO QD    ? famotidine-calcium carbonate-magnesium hydroxide (PEPCID COMPLETE) 10-800-165 MG chewable tablet Chew 1 tablet by mouth daily as needed.    ? losartan (COZAAR) 100 MG tablet TAKE 1 TABLET(100 MG) BY MOUTH DAILY 90 tablet 3  ? metoprolol succinate (TOPROL-XL) 25 MG 24 hr tablet Take 12.5 mg by mouth daily. Tale 1/2 tablet by mouth daily    ? multivitamin-lutein (OCUVITE-LUTEIN) CAPS capsule Take 1 capsule daily by mouth.    ? olmesartan-hydrochlorothiazide (BENICAR HCT) 40-12.5 MG tablet TAKE 1 TABLET BY MOUTH DAILY 90 tablet 3  ? omeprazole (PRILOSEC) 20 MG capsule Take 40 mg by mouth daily.    ? sertraline (ZOLOFT) 100 MG tablet Take 100 mg by mouth daily.    ? tamsulosin (FLOMAX) 0.4 MG CAPS capsule Take 0.4 mg by mouth daily.    ? tiZANidine (ZANAFLEX) 4 MG tablet Take 1 tablet (4 mg total) by mouth every 8 (eight) hours  as needed for muscle spasms (back pain). 60 tablet 1  ? ?No facility-administered medications prior to visit.  ? ? ?ROS: ?Review of Systems  ?Constitutional:  Negative for appetite change, fatigue and unexpected weight change.  ?HENT:  Negative for congestion, nosebleeds, sneezing, sore throat and trouble swallowing.   ?Eyes:  Negative for itching and visual disturbance.  ?Respiratory:  Negative for cough.   ?Cardiovascular:  Negative for chest pain, palpitations and leg swelling.  ?Gastrointestinal:  Negative for abdominal distention, blood in stool, diarrhea and nausea.  ?Genitourinary:  Negative for frequency and hematuria.  ?Musculoskeletal:  Negative for back pain, gait problem, joint swelling and neck pain.  ?Skin:  Negative for rash.  ?Neurological:  Negative for dizziness, tremors, speech difficulty and weakness.  ?Psychiatric/Behavioral:  Negative for agitation, dysphoric mood and sleep disturbance. The patient is not nervous/anxious.   ? ?Objective:  ?BP (!) 144/72   Pulse (!) 53   Temp 98.2 ?F (36.8 ?C) (Oral)   Ht '6\' 1"'$  (1.854 m)   Wt 232 lb 6 oz (105.4 kg)   SpO2 97%   BMI 30.66 kg/m?  ? ?BP Readings from Last 3 Encounters:  ?08/05/21 (!) 144/72  ?06/19/21 138/64  ?05/01/21 128/80  ? ? ?Wt Readings from Last 3 Encounters:  ?08/05/21 232 lb 6 oz (105.4  kg)  ?06/19/21 229 lb 9.6 oz (104.1 kg)  ?05/01/21 232 lb 12.8 oz (105.6 kg)  ? ? ?Physical Exam ?Constitutional:   ?   General: He is not in acute distress. ?   Appearance: He is well-developed. He is obese.  ?   Comments: NAD  ?Eyes:  ?   Conjunctiva/sclera: Conjunctivae normal.  ?   Pupils: Pupils are equal, round, and reactive to light.  ?Neck:  ?   Thyroid: No thyromegaly.  ?   Vascular: No JVD.  ?Cardiovascular:  ?   Rate and Rhythm: Normal rate and regular rhythm.  ?   Heart sounds: Normal heart sounds. No murmur heard. ?  No friction rub. No gallop.  ?Pulmonary:  ?   Effort: Pulmonary effort is normal. No respiratory distress.  ?   Breath  sounds: Normal breath sounds. No wheezing or rales.  ?Chest:  ?   Chest wall: No tenderness.  ?Abdominal:  ?   General: Bowel sounds are normal. There is no distension.  ?   Palpations: Abdomen is soft. There is no mass.  ?   Tenderness: There is no abdominal tenderness. There is no guarding or rebound.  ?Musculoskeletal:     ?   General: No tenderness. Normal range of motion.  ?   Cervical back: Normal range of motion.  ?Lymphadenopathy:  ?   Cervical: No cervical adenopathy.  ?Skin: ?   General: Skin is warm and dry.  ?   Findings: No rash.  ?Neurological:  ?   Mental Status: He is alert and oriented to person, place, and time.  ?   Cranial Nerves: No cranial nerve deficit.  ?   Motor: No abnormal muscle tone.  ?   Coordination: Coordination normal.  ?   Gait: Gait normal.  ?   Deep Tendon Reflexes: Reflexes are normal and symmetric.  ?Psychiatric:     ?   Behavior: Behavior normal.     ?   Thought Content: Thought content normal.     ?   Judgment: Judgment normal.  ?Large abdomen ? ?Lab Results  ?Component Value Date  ? WBC 10.0 11/23/2019  ? HGB 14.4 11/23/2019  ? HCT 41.5 11/23/2019  ? PLT 238.0 11/23/2019  ? GLUCOSE 92 02/12/2021  ? CHOL 165 02/12/2021  ? TRIG 165.0 (H) 02/12/2021  ? HDL 56.40 02/12/2021  ? LDLDIRECT 78.0 07/17/2020  ? [place] 76 02/12/2021  ? ALT 30 02/12/2021  ? AST 23 02/12/2021  ? NA 141 02/12/2021  ? K 4.0 02/12/2021  ? CL 104 02/12/2021  ? CREATININE 0.87 02/12/2021  ? BUN 16 02/12/2021  ? CO2 28 02/12/2021  ? TSH 1.97 02/12/2021  ? PSA 0.75 07/17/2020  ? HGBA1C 5.8 05/11/2019  ? ? ?US Abdomen Complete ? ?Result Date: 05/17/2019 ?CLINICAL DATA:  Nausea EXAM: ABDOMEN ULTRASOUND COMPLETE COMPARISON:  01/03/2010.  CT 04/27/2016 FINDINGS: Gallbladder: No gallstones or wall thickening visualized. No sonographic Murphy sign noted by sonographer. Common bile duct: Diameter: Normal caliber, 5 mm Liver: Diffusely increased echotexture compatible with fatty infiltration. 9 mm cyst in the left  lobe. No biliary ductal dilatation. Portal vein is patent on color Doppler imaging with normal direction of blood flow towards the liver. IVC: No abnormality visualized. Pancreas: Visualized portion unremarkable. Spleen: Size and appearance within normal limits. Right Kidney: Length: 12.0 cm. Echogenicity within normal limits. No mass or hydronephrosis visualized. Left Kidney: Length: 11.1 cm. Echogenicity within normal limits. No mass or hydronephrosis visualized. Abdominal aorta: No  aneurysm visualized. Other findings: None. IMPRESSION: Diffuse fatty infiltration of the liver. Electronically Signed   By: Rolm Baptise M.D.   On: 05/17/2019 11:23  ? ? ?Assessment & Plan:  ? ?Problem List Items Addressed This Visit   ? ? Barrett's esophagus  ?  Cont on Prilosec ?  ?  ? Chronic asthma, mild persistent, uncomplicated  ?  No sx's now ?  ?  ? DOE (dyspnea on exertion)  ?  Chronic. No change ?  ?  ? Dyslipidemia  ?  Cont on Lipitor ?  ?  ? Essential hypertension, benign  ?  Cont on Olmesartan HCT ?  ?  ? Fatigue  ?  Doing well ?  ?  ?  ? ? ?No orders of the defined types were placed in this encounter. ?  ? ? ?Follow-up: Return in about 6 months (around 02/05/2022) for Wellness Exam. ? ?Walker Kehr, MD ?

## 2021-08-05 NOTE — Assessment & Plan Note (Signed)
No sx's now ?

## 2021-08-05 NOTE — Assessment & Plan Note (Signed)
Doing well 

## 2021-08-05 NOTE — Assessment & Plan Note (Signed)
Cont on Prilosec 

## 2021-08-05 NOTE — Assessment & Plan Note (Signed)
Cont on Olmesartan HCT ?

## 2021-08-05 NOTE — Assessment & Plan Note (Signed)
Cont on Lipitor 

## 2021-09-01 DIAGNOSIS — C4359 Malignant melanoma of other part of trunk: Secondary | ICD-10-CM | POA: Diagnosis not present

## 2021-09-01 DIAGNOSIS — R229 Localized swelling, mass and lump, unspecified: Secondary | ICD-10-CM | POA: Diagnosis not present

## 2021-09-01 DIAGNOSIS — Z85820 Personal history of malignant melanoma of skin: Secondary | ICD-10-CM | POA: Diagnosis not present

## 2021-09-01 DIAGNOSIS — L821 Other seborrheic keratosis: Secondary | ICD-10-CM | POA: Diagnosis not present

## 2021-09-01 DIAGNOSIS — D229 Melanocytic nevi, unspecified: Secondary | ICD-10-CM | POA: Diagnosis not present

## 2021-09-01 DIAGNOSIS — Z08 Encounter for follow-up examination after completed treatment for malignant neoplasm: Secondary | ICD-10-CM | POA: Diagnosis not present

## 2021-09-30 ENCOUNTER — Other Ambulatory Visit: Payer: Self-pay | Admitting: Internal Medicine

## 2021-10-08 DIAGNOSIS — N401 Enlarged prostate with lower urinary tract symptoms: Secondary | ICD-10-CM | POA: Diagnosis not present

## 2021-10-08 DIAGNOSIS — R3912 Poor urinary stream: Secondary | ICD-10-CM | POA: Diagnosis not present

## 2021-10-24 DIAGNOSIS — M25562 Pain in left knee: Secondary | ICD-10-CM | POA: Diagnosis not present

## 2021-10-27 DIAGNOSIS — R3915 Urgency of urination: Secondary | ICD-10-CM | POA: Diagnosis not present

## 2021-10-27 DIAGNOSIS — N401 Enlarged prostate with lower urinary tract symptoms: Secondary | ICD-10-CM | POA: Diagnosis not present

## 2021-10-27 DIAGNOSIS — R350 Frequency of micturition: Secondary | ICD-10-CM | POA: Diagnosis not present

## 2021-11-29 ENCOUNTER — Other Ambulatory Visit: Payer: Self-pay | Admitting: Internal Medicine

## 2021-12-18 DIAGNOSIS — R001 Bradycardia, unspecified: Secondary | ICD-10-CM | POA: Diagnosis not present

## 2021-12-18 DIAGNOSIS — E7800 Pure hypercholesterolemia, unspecified: Secondary | ICD-10-CM | POA: Diagnosis not present

## 2021-12-18 DIAGNOSIS — R0602 Shortness of breath: Secondary | ICD-10-CM | POA: Diagnosis not present

## 2021-12-18 DIAGNOSIS — I471 Supraventricular tachycardia: Secondary | ICD-10-CM | POA: Diagnosis not present

## 2021-12-18 DIAGNOSIS — I10 Essential (primary) hypertension: Secondary | ICD-10-CM | POA: Diagnosis not present

## 2021-12-18 DIAGNOSIS — I4430 Unspecified atrioventricular block: Secondary | ICD-10-CM | POA: Diagnosis not present

## 2021-12-18 DIAGNOSIS — I517 Cardiomegaly: Secondary | ICD-10-CM | POA: Diagnosis not present

## 2021-12-23 DIAGNOSIS — R002 Palpitations: Secondary | ICD-10-CM | POA: Diagnosis not present

## 2021-12-30 ENCOUNTER — Telehealth: Payer: Self-pay | Admitting: Internal Medicine

## 2021-12-30 NOTE — Telephone Encounter (Signed)
For our records:   [facility] faxed orders for surgical clearance but missed spelled the pt's last name. They wrote in '[place]' instead of [facility].

## 2021-12-30 NOTE — Telephone Encounter (Signed)
[facility] has re-faxed the orders with the correct name and the PW has been placed in both Dr. Judeen Hammans boxes.

## 2021-12-30 NOTE — Telephone Encounter (Signed)
Place in purple folder for MD signature../l,mb

## 2021-12-31 NOTE — Telephone Encounter (Signed)
Faxed to [facility] @ [phone].Marland KitchenJohny Chess

## 2021-12-31 NOTE — Telephone Encounter (Signed)
Okay.  Thanks.

## 2022-01-08 ENCOUNTER — Encounter: Payer: Self-pay | Admitting: Gastroenterology

## 2022-01-20 DIAGNOSIS — I517 Cardiomegaly: Secondary | ICD-10-CM | POA: Diagnosis not present

## 2022-01-29 ENCOUNTER — Encounter (INDEPENDENT_AMBULATORY_CARE_PROVIDER_SITE_OTHER): Payer: PPO | Admitting: Ophthalmology

## 2022-01-29 DIAGNOSIS — H43813 Vitreous degeneration, bilateral: Secondary | ICD-10-CM | POA: Diagnosis not present

## 2022-01-29 DIAGNOSIS — H33301 Unspecified retinal break, right eye: Secondary | ICD-10-CM | POA: Diagnosis not present

## 2022-01-29 DIAGNOSIS — H3554 Dystrophies primarily involving the retinal pigment epithelium: Secondary | ICD-10-CM | POA: Diagnosis not present

## 2022-01-29 DIAGNOSIS — H35033 Hypertensive retinopathy, bilateral: Secondary | ICD-10-CM

## 2022-01-29 DIAGNOSIS — H353132 Nonexudative age-related macular degeneration, bilateral, intermediate dry stage: Secondary | ICD-10-CM | POA: Diagnosis not present

## 2022-01-29 DIAGNOSIS — I10 Essential (primary) hypertension: Secondary | ICD-10-CM | POA: Diagnosis not present

## 2022-02-02 DIAGNOSIS — E7800 Pure hypercholesterolemia, unspecified: Secondary | ICD-10-CM | POA: Diagnosis not present

## 2022-02-02 DIAGNOSIS — I517 Cardiomegaly: Secondary | ICD-10-CM | POA: Diagnosis not present

## 2022-02-02 DIAGNOSIS — I350 Nonrheumatic aortic (valve) stenosis: Secondary | ICD-10-CM | POA: Diagnosis not present

## 2022-02-02 DIAGNOSIS — I471 Supraventricular tachycardia: Secondary | ICD-10-CM | POA: Diagnosis not present

## 2022-02-02 DIAGNOSIS — I10 Essential (primary) hypertension: Secondary | ICD-10-CM | POA: Diagnosis not present

## 2022-02-02 DIAGNOSIS — R0602 Shortness of breath: Secondary | ICD-10-CM | POA: Diagnosis not present

## 2022-02-05 ENCOUNTER — Encounter: Payer: Self-pay | Admitting: Internal Medicine

## 2022-02-05 ENCOUNTER — Ambulatory Visit (INDEPENDENT_AMBULATORY_CARE_PROVIDER_SITE_OTHER): Payer: PPO | Admitting: Internal Medicine

## 2022-02-05 VITALS — BP 126/62 | HR 53 | Temp 98.1°F | Ht 73.0 in | Wt 231.8 lb

## 2022-02-05 DIAGNOSIS — R0609 Other forms of dyspnea: Secondary | ICD-10-CM | POA: Diagnosis not present

## 2022-02-05 DIAGNOSIS — N320 Bladder-neck obstruction: Secondary | ICD-10-CM

## 2022-02-05 DIAGNOSIS — E669 Obesity, unspecified: Secondary | ICD-10-CM | POA: Diagnosis not present

## 2022-02-05 DIAGNOSIS — Z7184 Encounter for health counseling related to travel: Secondary | ICD-10-CM

## 2022-02-05 DIAGNOSIS — H3530 Unspecified macular degeneration: Secondary | ICD-10-CM | POA: Insufficient documentation

## 2022-02-05 DIAGNOSIS — Z0000 Encounter for general adult medical examination without abnormal findings: Secondary | ICD-10-CM

## 2022-02-05 DIAGNOSIS — Z23 Encounter for immunization: Secondary | ICD-10-CM | POA: Diagnosis not present

## 2022-02-05 DIAGNOSIS — H353130 Nonexudative age-related macular degeneration, bilateral, stage unspecified: Secondary | ICD-10-CM | POA: Diagnosis not present

## 2022-02-05 MED ORDER — AZITHROMYCIN 250 MG PO TABS: ORAL_TABLET | ORAL | 0 refills | Status: DC

## 2022-02-05 NOTE — Assessment & Plan Note (Signed)
Pt saw Dr Claire Shown at [facility] (Cardiology) recently

## 2022-02-05 NOTE — Progress Notes (Signed)
Subjective:  Patient ID: Ferrel Simington, male    DOB: 01/31/1941  Age: 81 y.o. MRN: [id number]  CC: Follow-up (6 month f/u- Flu shot)   HPI Lynard Postlewait presents for HTN, DOE better. MD Traveling to [place] In a heart study at [place], saw Dr Claire Shown at [facility] (Cardiology)  Outpatient Medications Prior to Visit  Medication Sig Dispense Refill   amLODipine-valsartan (EXFORGE) 10-320 MG tablet Take 1 tablet by mouth daily. Take 1 tablet by mouth daily.     ARIPiprazole (ABILIFY) 10 MG tablet Take 10 mg by mouth daily.     aspirin 325 MG tablet Take 325 mg by mouth daily.     atorvastatin (LIPITOR) 40 MG tablet TAKE 1 TABLET(40 MG) BY MOUTH DAILY 90 tablet 3   benzonatate (TESSALON) 200 MG capsule Take 1 capsule (200 mg total) by mouth 3 (three) times daily as needed for cough. 20 capsule 0   budesonide-formoterol (SYMBICORT) 80-4.5 MCG/ACT inhaler Inhale 2 puffs into the lungs 2 (two) times daily. 1 each 11   Cholecalciferol (RA VITAMIN D-3) 25 MCG (1000 UT) tablet Take 1 tablet (1,000 Units total) by mouth daily. 90 tablet 3   clobetasol (TEMOVATE) 0.05 % external solution Apply 1 application topically 2 (two) times daily. Use for scalp lesions 50 mL 3   desvenlafaxine (PRISTIQ) 100 MG 24 hr tablet      dutasteride (AVODART) 0.5 MG capsule TK 1 C PO QD     famotidine-calcium carbonate-magnesium hydroxide (PEPCID COMPLETE) 10-800-165 MG chewable tablet Chew 1 tablet by mouth daily as needed.     multivitamin-lutein (OCUVITE-LUTEIN) CAPS capsule Take 1 capsule daily by mouth.     omeprazole (PRILOSEC) 20 MG capsule Take 40 mg by mouth daily.     sertraline (ZOLOFT) 100 MG tablet Take 100 mg by mouth daily.     tamsulosin (FLOMAX) 0.4 MG CAPS capsule Take 0.4 mg by mouth daily.     tiZANidine (ZANAFLEX) 4 MG tablet Take 1 tablet (4 mg total) by mouth every 8 (eight) hours as needed for muscle spasms (back pain). 60 tablet 1   torsemide (DEMADEX) 5 MG tablet Take 5 mg by mouth daily. Take 5  mg by mouth daily.     losartan (COZAAR) 100 MG tablet TAKE 1 TABLET(100 MG) BY MOUTH DAILY (Patient not taking: Reported on 02/05/2022) 90 tablet 2   metoprolol succinate (TOPROL-XL) 25 MG 24 hr tablet Take 12.5 mg by mouth daily. Tale 1/2 tablet by mouth daily (Patient not taking: Reported on 02/05/2022)     olmesartan-hydrochlorothiazide (BENICAR HCT) 40-12.5 MG tablet TAKE 1 TABLET BY MOUTH DAILY (Patient not taking: Reported on 02/05/2022) 90 tablet 2   No facility-administered medications prior to visit.    ROS: Review of Systems  Constitutional:  Negative for appetite change, fatigue and unexpected weight change.  HENT:  Negative for congestion, nosebleeds, sneezing, sore throat and trouble swallowing.   Eyes:  Negative for itching and visual disturbance.  Respiratory:  Positive for shortness of breath. Negative for cough.   Cardiovascular:  Negative for chest pain, palpitations and leg swelling.  Gastrointestinal:  Negative for abdominal distention, blood in stool, diarrhea and nausea.  Genitourinary:  Negative for frequency and hematuria.  Musculoskeletal:  Negative for back pain, gait problem, joint swelling and neck pain.  Skin:  Negative for rash.  Neurological:  Negative for dizziness, tremors, speech difficulty and weakness.  Psychiatric/Behavioral:  Negative for agitation, dysphoric mood and sleep disturbance. The patient is nervous/anxious.  Objective:  BP 126/62 (BP Location: Left Arm)   Pulse (!) 53   Temp 98.1 F (36.7 C) (Oral)   Ht '6\' 1"'$  (1.854 m)   Wt 231 lb 12.8 oz (105.1 kg)   SpO2 97%   BMI 30.58 kg/m   BP Readings from Last 3 Encounters:  02/05/22 126/62  08/05/21 (!) 144/72  06/19/21 138/64    Wt Readings from Last 3 Encounters:  02/05/22 231 lb 12.8 oz (105.1 kg)  08/05/21 232 lb 6 oz (105.4 kg)  06/19/21 229 lb 9.6 oz (104.1 kg)    Physical Exam Constitutional:      General: He is not in acute distress.    Appearance: He is well-developed.  He is obese.     Comments: NAD  Eyes:     Conjunctiva/sclera: Conjunctivae normal.     Pupils: Pupils are equal, round, and reactive to light.  Neck:     Thyroid: No thyromegaly.     Vascular: No JVD.  Cardiovascular:     Rate and Rhythm: Normal rate and regular rhythm.     Heart sounds: Normal heart sounds. No murmur heard.    No friction rub. No gallop.  Pulmonary:     Effort: Pulmonary effort is normal. No respiratory distress.     Breath sounds: Normal breath sounds. No wheezing or rales.  Chest:     Chest wall: No tenderness.  Abdominal:     General: Bowel sounds are normal. There is no distension.     Palpations: Abdomen is soft. There is no mass.     Tenderness: There is no abdominal tenderness. There is no guarding or rebound.  Musculoskeletal:        General: No tenderness. Normal range of motion.     Cervical back: Normal range of motion.  Lymphadenopathy:     Cervical: No cervical adenopathy.  Skin:    General: Skin is warm and dry.     Findings: No rash.  Neurological:     Mental Status: He is alert and oriented to person, place, and time.     Cranial Nerves: No cranial nerve deficit.     Motor: No abnormal muscle tone.     Coordination: Coordination normal.     Gait: Gait normal.     Deep Tendon Reflexes: Reflexes are normal and symmetric.  Psychiatric:        Behavior: Behavior normal.        Thought Content: Thought content normal.        Judgment: Judgment normal.     Lab Results  Component Value Date   WBC 9.2 08/05/2021   HGB 13.9 08/05/2021   HCT 41.3 08/05/2021   PLT 215.0 08/05/2021   GLUCOSE 88 08/05/2021   CHOL 165 02/12/2021   TRIG 165.0 (H) 02/12/2021   HDL 56.40 02/12/2021   LDLDIRECT 78.0 07/17/2020   LDLCALC 76 02/12/2021   ALT 32 08/05/2021   AST 25 08/05/2021   NA 138 08/05/2021   K 3.9 08/05/2021   CL 100 08/05/2021   CREATININE 0.85 08/05/2021   BUN 16 08/05/2021   CO2 28 08/05/2021   TSH 1.87 08/05/2021   PSA 0.75  07/17/2020   HGBA1C 5.8 05/11/2019    US Abdomen Complete  Result Date: 05/17/2019 CLINICAL DATA:  Nausea EXAM: ABDOMEN ULTRASOUND COMPLETE COMPARISON:  01/03/2010.  CT 04/27/2016 FINDINGS: Gallbladder: No gallstones or wall thickening visualized. No sonographic Murphy sign noted by sonographer. Common bile duct: Diameter: Normal caliber, 5 mm Liver:  Diffusely increased echotexture compatible with fatty infiltration. 9 mm cyst in the left lobe. No biliary ductal dilatation. Portal vein is patent on color Doppler imaging with normal direction of blood flow towards the liver. IVC: No abnormality visualized. Pancreas: Visualized portion unremarkable. Spleen: Size and appearance within normal limits. Right Kidney: Length: 12.0 cm. Echogenicity within normal limits. No mass or hydronephrosis visualized. Left Kidney: Length: 11.1 cm. Echogenicity within normal limits. No mass or hydronephrosis visualized. Abdominal aorta: No aneurysm visualized. Other findings: None. IMPRESSION: Diffuse fatty infiltration of the liver. Electronically Signed   By: Rolm Baptise M.D.   On: 05/17/2019 11:23    Assessment & Plan:   Problem List Items Addressed This Visit     DOE (dyspnea on exertion) - Primary    Pt saw Dr Claire Shown at [facility] (Cardiology) recently      Relevant Orders   TSH   Urinalysis   CBC with Differential/Platelet   Lipid panel   Comprehensive metabolic panel   Macular degeneration    F/u w/Dr Zigmund Daniel L>R      Obesity (BMI 30.0-34.9)    Lost 1 lb      Travel advice encounter    Zpac      Well adult exam   Other Visit Diagnoses     Needs flu shot       Relevant Orders   Flu Vaccine QUAD High Dose(Fluad) (Completed)   Bladder neck obstruction       Relevant Orders   PSA         Meds ordered this encounter  Medications   azithromycin (ZITHROMAX Z-PAK) 250 MG tablet    Sig: As directed    Dispense:  6 tablet    Refill:  0      Follow-up: Return in about 6 months  (around 08/06/2022) for Wellness Exam.  Walker Kehr, MD

## 2022-02-05 NOTE — Assessment & Plan Note (Signed)
F/u w/Dr Zigmund Daniel L>R

## 2022-02-05 NOTE — Assessment & Plan Note (Signed)
Lost 1 lb

## 2022-02-05 NOTE — Assessment & Plan Note (Signed)
Zpac 

## 2022-04-06 DIAGNOSIS — N401 Enlarged prostate with lower urinary tract symptoms: Secondary | ICD-10-CM | POA: Diagnosis not present

## 2022-04-06 DIAGNOSIS — R3912 Poor urinary stream: Secondary | ICD-10-CM | POA: Diagnosis not present

## 2022-05-04 ENCOUNTER — Ambulatory Visit (INDEPENDENT_AMBULATORY_CARE_PROVIDER_SITE_OTHER): Payer: PPO

## 2022-05-04 VITALS — Ht 73.0 in

## 2022-05-04 DIAGNOSIS — Z0000 Encounter for general adult medical examination without abnormal findings: Secondary | ICD-10-CM

## 2022-05-04 NOTE — Progress Notes (Addendum)
Virtual Visit via Telephone Note  I connected with  Oscar [name] on 05/04/22 at  3:15 PM EST by telephone and verified that I am speaking with the correct person using two identifiers.  Location: Patient: Home Provider: [place] [facility] Persons participating in the virtual visit: Marissa   I discussed the limitations, risks, security and privacy concerns of performing an evaluation and management service by telephone and the availability of in person appointments. The patient expressed understanding and agreed to proceed.  Interactive audio and video telecommunications were attempted between this nurse and patient, however failed, due to patient having technical difficulties OR patient did not have access to video capability.  We continued and completed visit with audio only.  Some vital signs may be absent or patient reported.   Sheral Flow, LPN  Subjective:   Oscar [name] is a 81 y.o. male who presents for Medicare Annual/Subsequent preventive examination.  Review of Systems     Cardiac Risk Factors include: advanced age (>18mn, >>66women);dyslipidemia;family history of premature cardiovascular disease;hypertension;male gender;obesity (BMI >30kg/m2)     Objective:    Today's Vitals   05/04/22 1517  Height: '6\' 1"'$  (1.854 m)  PainSc: 0-No pain   Body mass index is 30.58 kg/m.     05/04/2022    3:22 PM 05/01/2021   10:50 AM 01/11/2020    3:53 PM 06/08/2018   11:31 AM 04/02/2017    4:22 PM 05/23/2015   10:57 AM 01/09/2015   10:05 AM  Advanced Directives  Does Patient Have a Medical Advance Directive? Yes Yes No Yes Yes Yes Yes  Type of [place]of [place] Living will;Healthcare Power of [place] will [place] will [place] City[place] will   Does patient want to make changes to medical advance directive?  No - Patient declined  Yes (MAU/Ambulatory/Procedural Areas - Information given)      Copy of HTrentonin Chart? No - copy requested No - copy requested  No - copy requested No - copy requested  Yes    Current Medications (verified) Outpatient Encounter Medications as of 05/04/2022  Medication Sig   amLODipine-valsartan (EXFORGE) 10-320 MG tablet Take 1 tablet by mouth daily. Take 1 tablet by mouth daily.   ARIPiprazole (ABILIFY) 10 MG tablet Take 10 mg by mouth daily.   aspirin 325 MG tablet Take 325 mg by mouth daily.   atorvastatin (LIPITOR) 40 MG tablet TAKE 1 TABLET(40 MG) BY MOUTH DAILY   budesonide-formoterol (SYMBICORT) 80-4.5 MCG/ACT inhaler Inhale 2 puffs into the lungs 2 (two) times daily.   Cholecalciferol (RA VITAMIN D-3) 25 MCG (1000 UT) tablet Take 1 tablet (1,000 Units total) by mouth daily.   clobetasol (TEMOVATE) 0.05 % external solution Apply 1 application topically 2 (two) times daily. Use for scalp lesions   desvenlafaxine (PRISTIQ) 100 MG 24 hr tablet    dutasteride (AVODART) 0.5 MG capsule TK 1 C PO QD   famotidine-calcium carbonate-magnesium hydroxide (PEPCID COMPLETE) 10-800-165 MG chewable tablet Chew 1 tablet by mouth daily as needed.   multivitamin-lutein (OCUVITE-LUTEIN) CAPS capsule Take 1 capsule daily by mouth.   omeprazole (PRILOSEC) 20 MG capsule Take 40 mg by mouth daily.   sertraline (ZOLOFT) 100 MG tablet Take 100 mg by mouth daily.   tamsulosin (FLOMAX) 0.4 MG CAPS capsule Take 0.4 mg by mouth daily.   tiZANidine (ZANAFLEX) 4 MG tablet Take 1 tablet (4 mg total) by mouth  every 8 (eight) hours as needed for muscle spasms (back pain).   torsemide (DEMADEX) 5 MG tablet Take 5 mg by mouth daily. Take 5 mg by mouth daily.   azithromycin (ZITHROMAX Z-PAK) 250 MG tablet As directed (Patient not taking: Reported on 05/04/2022)   benzonatate (TESSALON) 200 MG capsule Take 1 capsule (200 mg total) by mouth 3 (three) times daily as needed for cough. (Patient not  taking: Reported on 05/04/2022)   No facility-administered encounter medications on file as of 05/04/2022.    Allergies (verified) Celecoxib, Penicillins, and Rofecoxib   History: Past Medical History:  Diagnosis Date   Adenomatous colon polyp    Allergic rhinitis, cause unspecified    Allergy    Anxiety state, unspecified    Barrett esophagus    BPH (benign prostatic hypertrophy)    BX 2007   Cancer ([place])    melanoma   Cataract    bilaterally removed   Depressive disorder, not elsewhere classified    GERD (gastroesophageal reflux disease)    Hypertension    Hypogonadism male    Microhematuria    Neuromuscular disorder ([place])    small HH 05-26-2012 egd    Other and unspecified hyperlipidemia    Rosacea    UTI (lower urinary tract infection) 2009   Past Surgical History:  Procedure Laterality Date   CATARACT EXTRACTION  05/10/2012   left   COLONOSCOPY  04-22-2010   ESOPHAGOGASTRODUODENOSCOPY  05-26-2012   [facility]   HAND SURGERY Left    MELANOMA EXCISION     POLYPECTOMY     prev colon TA polyps- no polyps in 2011   PROSTATE BIOPSY  2011   Dr Winifred Olive    UPPER GASTROINTESTINAL ENDOSCOPY     Family History  Problem Relation Age of Onset   Hypertension Mother    Cancer Mother        colon ca   Colon cancer Mother 71   Heart disease Father        MI   Coronary artery disease Other    Stomach cancer Maternal Grandfather    Colon polyps Neg Hx    Esophageal cancer Neg Hx    Rectal cancer Neg Hx    Social History   Socioeconomic History   Marital status: Married    Spouse name: Not on file   Number of children: Not on file   Years of education: Not on file   Highest education level: Not on file  Occupational History   Occupation: [employer] - Retired 2010/june  Tobacco Use   Smoking status: Former    Packs/day: 1.50    Years: 20.00    Total pack years: 30.00    Types: Cigarettes    Quit date: 05/25/1978    Years since quitting: 43.9   Smokeless tobacco: Never   Vaping Use   Vaping Use: Never used  Substance and Sexual Activity   Alcohol use: Yes    Alcohol/week: 7.0 standard drinks of alcohol    Types: 7 Standard drinks or equivalent per week    Comment: 2 drinks per day, wine and mixed drinks   Drug use: No   Sexual activity: Yes  Other Topics Concern   Not on file  Social History Narrative   Married - Seperated 2010, reuniting 2012       Social Determinants of Health   Financial Resource Strain: Low Risk  (05/04/2022)   Overall Financial Resource Strain (CARDIA)    Difficulty of Paying Living Expenses: Not hard  at all  Food Insecurity: No Food Insecurity (05/04/2022)   Hunger Vital Sign    Worried About Running Out of Food in the Last Year: Never true    Ran Out of Food in the Last Year: Never true  Transportation Needs: No Transportation Needs (05/04/2022)   PRAPARE - Hydrologist (Medical): No    Lack of Transportation (Non-Medical): No  Physical Activity: Sufficiently Active (05/04/2022)   Exercise Vital Sign    Days of Exercise per Week: 5 days    Minutes of Exercise per Session: 30 min  Stress: No Stress Concern Present (05/04/2022)   [place]    Feeling of Stress : Not at all  Social Connections: [place] (05/04/2022)   Social Connection and Isolation Panel [NHANES]    Frequency of Communication with Friends and Family: More than three times a week    Frequency of Social Gatherings with Friends and Family: More than three times a week    Attends Religious Services: More than 4 times per year    Active Member of [employer] or Organizations: Yes    Attends Music therapist: More than 4 times per year    Marital Status: Married    Tobacco Counseling Counseling given: Not Answered   Clinical Intake:  Pre-visit preparation completed: Yes  Pain : No/denies pain Pain Score: 0-No pain     BMI -  recorded: 30.59 (02/05/2022) Nutritional Risks: None Diabetes: No  How often do you need to have someone help you when you read instructions, pamphlets, or other written materials from your doctor or pharmacy?: 1 - Never What is the last grade level you completed in school?: HS[employer]; Bachelor's Degree  Diabetic? no  Interpreter Needed?: No  Information entered by :: Lisette Abu, LPN.   Activities of Daily Living    05/04/2022    3:27 PM  In your present state of health, do you have any difficulty performing the following activities:  Hearing? 0  Vision? 0  Difficulty concentrating or making decisions? 0  Walking or climbing stairs? 0  Dressing or bathing? 0  Doing errands, shopping? 0  Preparing Food and eating ? N  Using the Toilet? N  In the past six months, have you accidently leaked urine? N  Do you have problems with loss of bowel control? N  Managing your Medications? N  Managing your Finances? N  Housekeeping or managing your Housekeeping? N    Patient Care Team: Plotnikov, Evie Lacks, MD as PCP - General Chucky May, MD (Psychiatry) Festus Aloe, MD as Attending Physician (Urology) Tanda Rockers, MD as Attending Physician (Pulmonary Disease) Ladene Artist, MD as Attending Physician (Gastroenterology) Macarthur Critchley, OD as Referring Physician (Optometry) Hayden Pedro, MD as Consulting Physician (Ophthalmology) Roxan Diesel, MD as Referring Physician (Cardiology)  Indicate any recent Medical Services you may have received from other than [facility] providers in the past year (date may be approximate).     Assessment:   This is a routine wellness examination for [place].  Hearing/Vision screen Hearing Screening - Comments:: Patient has hearing difficulties; wears hearing aids.   Vision Screening - Comments:: Wears rx glasses - up to date with routine eye exams with Tempie Hoist, MD.   Dietary issues and exercise activities discussed: Current  Exercise Habits: Home exercise routine, Type of exercise: walking, Time (Minutes): 30, Frequency (Times/Week): 5, Weekly Exercise (Minutes/Week): 150, Intensity: Moderate, Exercise limited by:  None identified   Goals Addressed             This Visit's Progress    My goal for 2024 is to maintain my current health status by continuing to eat healthy, stay physically active and socially active.        Depression Screen    05/04/2022    3:27 PM 08/05/2021   10:10 AM 05/01/2021   10:41 AM 01/11/2020    3:50 PM 06/08/2018   12:35 PM 04/02/2017    4:22 PM 02/26/2016    8:11 AM  PHQ 2/9 Scores  PHQ - 2 Score 0 0 0 0 0 0 0  PHQ- 9 Score     0 0     Fall Risk    05/04/2022    3:23 PM 08/05/2021   10:10 AM 05/01/2021   10:42 AM 01/11/2020    3:58 PM 11/15/2019    9:17 AM  [place] in the past year? 0 0 0 0 0  Number falls in past yr: 0  0 0 0  Injury with Fall? 0  0 0 0  Risk for fall due to : No Fall Risks  No Fall Risks No Fall Risks   Follow up Falls prevention discussed  Falls prevention discussed Falls evaluation completed     FALL RISK PREVENTION PERTAINING TO THE HOME:  Any stairs in or around the home? Yes  If so, are there any without handrails? No  Home free of loose throw rugs in walkways, pet beds, electrical cords, etc? Yes  Adequate lighting in your home to reduce risk of falls? Yes   ASSISTIVE DEVICES UTILIZED TO PREVENT FALLS:  Life alert? No  Use of a cane, walker or w/c? No  Grab bars in the bathroom? Yes  Shower chair or bench in shower? Yes  Elevated toilet seat or a handicapped toilet? No   TIMED UP AND GO:  Was the test performed? No . Phone Visit  Cognitive Function:    06/08/2018   11:41 AM 04/02/2017    4:22 PM  MMSE - Mini Mental State Exam  Orientation to time 5 5  Orientation to Place 5 5  Registration 3 3  Attention/ Calculation 5 5  Recall 3 3  Language- name 2 objects 2 2  Language- repeat 1 1  Language- follow 3 step command  3 3  Language- read & follow direction 1 1  Write a sentence 1 1  Copy design 1 1  Total score 30 30        05/04/2022    3:27 PM  6CIT Screen  What Year? 0 points  What month? 0 points  What time? 0 points  Count back from 20 0 points  Months in reverse 0 points  Repeat phrase 0 points  Total Score 0 points    Immunizations Immunization History  Administered Date(s) Administered   Fluad Quad(high Dose 65+) 02/22/2021, 02/05/2022   Influenza Split 04/09/2011, 03/01/2012   Influenza Whole 03/19/2009   Influenza, High Dose Seasonal PF 02/26/2016, 01/25/2019   Influenza,inj,Quad PF,6+ Mos 03/16/2013, 02/15/2014, 02/20/2015   Influenza-Unspecified 03/15/2017, 02/06/2018   PFIZER Comirnaty(Gray Top)Covid-19 Tri-Sucrose Vaccine 02/19/2022   PFIZER(Purple Top)SARS-COV-2 Vaccination 06/14/2019, 07/03/2019, 03/16/2020, 03/05/2021, 11/12/2021   Pneumococcal Conjugate-13 08/15/2013   Pneumococcal Polysaccharide-23 07/22/2009   Respiratory Syncytial Virus Vaccine,Recomb Aduvanted(Arexvy) 02/19/2022   Td 08/20/2014   Tdap 08/12/2016   Unspecified SARS-COV-2 Vaccination 07/17/2019   Zoster, Live 02/20/2015    TDAP  status: Up to date  Flu Vaccine status: Up to date  Pneumococcal vaccine status: Up to date  Covid-19 vaccine status: Completed vaccines  Qualifies for Shingles Vaccine? Yes   Zostavax completed Yes   Shingrix Completed?: No.    Education has been provided regarding the importance of this vaccine. Patient has been advised to call insurance company to determine out of pocket expense if they have not yet received this vaccine. Advised may also receive vaccine at local pharmacy or Health Dept. Verbalized acceptance and understanding.  Screening Tests Health Maintenance  Topic Date Due   Zoster Vaccines- Shingrix (1 of 2) Never done   COVID-19 Vaccine (8 - 2023-24 season) 04/16/2022   Medicare Annual Wellness (AWV)  05/05/2023   COLONOSCOPY (Pts 45-59yr Insurance  coverage will need to be confirmed)  10/13/2023   DTaP/Tdap/Td (3 - Td or Tdap) 08/13/2026   Pneumonia Vaccine 80 Years old  Completed   INFLUENZA VACCINE  Completed   HPV VACCINES  Aged Out    Health Maintenance  Health Maintenance Due  Topic Date Due   Zoster Vaccines- Shingrix (1 of 2) Never done   COVID-19 Vaccine (8 - 2023-24 season) 04/16/2022    Colorectal cancer screening: Type of screening: Colonoscopy. Completed 10/13/2018. Repeat every 5 years  Lung Cancer Screening: (Low Dose CT Chest recommended if Age 80-80years, 30 pack-year currently smoking OR have quit w/in 15years.) does not qualify.   Lung Cancer Screening Referral: no  Additional Screening:  Hepatitis C Screening: does not qualify; Completed no  Vision Screening: Recommended annual ophthalmology exams for early detection of glaucoma and other disorders of the eye. Is the patient up to date with their annual eye exam?  Yes  Who is the provider or what is the name of the office in which the patient attends annual eye exams? JTempie Hoist MD. If pt is not established with a provider, would they like to be referred to a provider to establish care? No .   Dental Screening: Recommended annual dental exams for proper oral hygiene  Community Resource Referral / Chronic Care Management: CRR required this visit?  No   CCM required this visit?  No      Plan:     I have personally reviewed and noted the following in the patient's chart:   Medical and social history Use of alcohol, tobacco or illicit drugs  Current medications and supplements including opioid prescriptions. Patient is not currently taking opioid prescriptions. Functional ability and status Nutritional status Physical activity Advanced directives List of other physicians Hospitalizations, surgeries, and ER visits in previous 12 months Vitals Screenings to include cognitive, depression, and falls Referrals and appointments  In  addition, I have reviewed and discussed with patient certain preventive protocols, quality metrics, and best practice recommendations. A written personalized care plan for preventive services as well as general preventive health recommendations were provided to patient.     SSheral Flow LPN   [id number]  Nurse Notes: N/A  Medical screening examination/treatment/procedure(s) were performed by non-physician practitioner and as supervising physician I was immediately available for consultation/collaboration.  I agree with above. ALew Dawes MD

## 2022-05-04 NOTE — Patient Instructions (Addendum)
Mr. Kostelnik , Thank you for taking time to come for your Medicare Wellness Visit. I appreciate your ongoing commitment to your health goals. Please review the following plan we discussed and let me know if I can assist you in the future.   These are the goals we discussed:  Goals      My goal for 2024 is to maintain my current health status by continuing to eat healthy, stay physically active and socially active.        This is a list of the screening recommended for you and due dates:  Health Maintenance  Topic Date Due   Zoster (Shingles) Vaccine (1 of 2) Never done   COVID-19 Vaccine (8 - 2023-24 season) 04/16/2022   Medicare Annual Wellness Visit  05/05/2023   Colon Cancer Screening  10/13/2023   DTaP/Tdap/Td vaccine (3 - Td or Tdap) 08/13/2026   Pneumonia Vaccine  Completed   Flu Shot  Completed   HPV Vaccine  Aged Out   Immunization History  Administered Date(s) Administered   Fluad Quad(high Dose 65+) 02/22/2021, 02/05/2022   Influenza Split 04/09/2011, 03/01/2012   Influenza Whole 03/19/2009   Influenza, High Dose Seasonal PF 02/26/2016, 01/25/2019   Influenza,inj,Quad PF,6+ Mos 03/16/2013, 02/15/2014, 02/20/2015   Influenza-Unspecified 03/15/2017, 02/06/2018   PFIZER Comirnaty(Gray Top)Covid-19 Tri-Sucrose Vaccine 02/19/2022   PFIZER(Purple Top)SARS-COV-2 Vaccination 06/14/2019, 07/03/2019, 03/16/2020, 03/05/2021, 11/12/2021   Pneumococcal Conjugate-13 08/15/2013   Pneumococcal Polysaccharide-23 07/22/2009   Respiratory Syncytial Virus Vaccine,Recomb Aduvanted(Arexvy) 02/19/2022   Td 08/20/2014   Tdap 08/12/2016   Unspecified SARS-COV-2 Vaccination 07/17/2019   Zoster, Live 02/20/2015    Advanced directives: Yes; Please bring a copy of your health care power of attorney and living will to the office at your convenience.  Conditions/risks identified: Yes  Next appointment: Follow up in one year for your annual wellness visit.   Preventive Care 59 Years and  Older, Male  Preventive care refers to lifestyle choices and visits with your health care provider that can promote health and wellness. What does preventive care include? A yearly physical exam. This is also called an annual well check. Dental exams once or twice a year. Routine eye exams. Ask your health care provider how often you should have your eyes checked. Personal lifestyle choices, including: Daily care of your teeth and gums. Regular physical activity. Eating a healthy diet. Avoiding tobacco and drug use. Limiting alcohol use. Practicing safe sex. Taking low doses of aspirin every day. Taking vitamin and mineral supplements as recommended by your health care provider. What happens during an annual well check? The services and screenings done by your health care provider during your annual well check will depend on your age, overall health, lifestyle risk factors, and family history of disease. Counseling  Your health care provider may ask you questions about your: Alcohol use. Tobacco use. Drug use. Emotional well-being. Home and relationship well-being. Sexual activity. Eating habits. History of falls. Memory and ability to understand (cognition). Work and work Statistician. Screening  You may have the following tests or measurements: Height, weight, and BMI. Blood pressure. Lipid and cholesterol levels. These may be checked every 5 years, or more frequently if you are over 79 years old. Skin check. Lung cancer screening. You may have this screening every year starting at age 78 if you have a 30-pack-year history of smoking and currently smoke or have quit within the past 15 years. Fecal occult blood test (FOBT) of the stool. You may have this test every year  starting at age 23. Flexible sigmoidoscopy or colonoscopy. You may have a sigmoidoscopy every 5 years or a colonoscopy every 10 years starting at age 1. Prostate cancer screening. Recommendations will vary  depending on your family history and other risks. Hepatitis C blood test. Hepatitis B blood test. Sexually transmitted disease (STD) testing. Diabetes screening. This is done by checking your blood sugar (glucose) after you have not eaten for a while (fasting). You may have this done every 1-3 years. Abdominal aortic aneurysm (AAA) screening. You may need this if you are a current or former smoker. Osteoporosis. You may be screened starting at age 25 if you are at high risk. Talk with your health care provider about your test results, treatment options, and if necessary, the need for more tests. Vaccines  Your health care provider may recommend certain vaccines, such as: Influenza vaccine. This is recommended every year. Tetanus, diphtheria, and acellular pertussis (Tdap, Td) vaccine. You may need a Td booster every 10 years. Zoster vaccine. You may need this after age 61. Pneumococcal 13-valent conjugate (PCV13) vaccine. One dose is recommended after age 101. Pneumococcal polysaccharide (PPSV23) vaccine. One dose is recommended after age 67. Talk to your health care provider about which screenings and vaccines you need and how often you need them. This information is not intended to replace advice given to you by your health care provider. Make sure you discuss any questions you have with your health care provider. Document Released: 06/07/2015 Document Revised: 01/29/2016 Document Reviewed: 03/12/2015 Elsevier Interactive Patient Education  2017 [place] Prevention in the Home Falls can cause injuries. They can happen to people of all ages. There are many things you can do to make your home safe and to help prevent falls. What can I do on the outside of my home? Regularly fix the edges of walkways and driveways and fix any cracks. Remove anything that might make you trip as you walk through a door, such as a raised step or threshold. Trim any bushes or trees on the path to your  home. Use bright outdoor lighting. Clear any walking paths of anything that might make someone trip, such as rocks or tools. Regularly check to see if handrails are loose or broken. Make sure that both sides of any steps have handrails. Any raised decks and porches should have guardrails on the edges. Have any leaves, snow, or ice cleared regularly. Use sand or salt on walking paths during winter. Clean up any spills in your garage right away. This includes oil or grease spills. What can I do in the bathroom? Use night lights. Install grab bars by the toilet and in the tub and shower. Do not use towel bars as grab bars. Use non-skid mats or decals in the tub or shower. If you need to sit down in the shower, use a plastic, non-slip stool. Keep the floor dry. Clean up any water that spills on the floor as soon as it happens. Remove soap buildup in the tub or shower regularly. Attach bath mats securely with double-sided non-slip rug tape. Do not have throw rugs and other things on the floor that can make you trip. What can I do in the bedroom? Use night lights. Make sure that you have a light by your bed that is easy to reach. Do not use any sheets or blankets that are too big for your bed. They should not hang down onto the floor. Have a firm chair that has side arms.  You can use this for support while you get dressed. Do not have throw rugs and other things on the floor that can make you trip. What can I do in the kitchen? Clean up any spills right away. Avoid walking on wet floors. Keep items that you use a lot in easy-to-reach places. If you need to reach something above you, use a strong step stool that has a grab bar. Keep electrical cords out of the way. Do not use floor polish or wax that makes floors slippery. If you must use wax, use non-skid floor wax. Do not have throw rugs and other things on the floor that can make you trip. What can I do with my stairs? Do not leave any  items on the stairs. Make sure that there are handrails on both sides of the stairs and use them. Fix handrails that are broken or loose. Make sure that handrails are as long as the stairways. Check any carpeting to make sure that it is firmly attached to the stairs. Fix any carpet that is loose or worn. Avoid having throw rugs at the top or bottom of the stairs. If you do have throw rugs, attach them to the floor with carpet tape. Make sure that you have a light switch at the top of the stairs and the bottom of the stairs. If you do not have them, ask someone to add them for you. What else can I do to help prevent falls? Wear shoes that: Do not have high heels. Have rubber bottoms. Are comfortable and fit you well. Are closed at the toe. Do not wear sandals. If you use a stepladder: Make sure that it is fully opened. Do not climb a closed stepladder. Make sure that both sides of the stepladder are locked into place. Ask someone to hold it for you, if possible. Clearly mark and make sure that you can see: Any grab bars or handrails. First and last steps. Where the edge of each step is. Use tools that help you move around (mobility aids) if they are needed. These include: Canes. Walkers. Scooters. Crutches. Turn on the lights when you go into a dark area. Replace any light bulbs as soon as they burn out. Set up your furniture so you have a clear path. Avoid moving your furniture around. If any of your floors are uneven, fix them. If there are any pets around you, be aware of where they are. Review your medicines with your doctor. Some medicines can make you feel dizzy. This can increase your chance of falling. Ask your doctor what other things that you can do to help prevent falls. This information is not intended to replace advice given to you by your health care provider. Make sure you discuss any questions you have with your health care provider. Document Released: 03/07/2009  Document Revised: 10/17/2015 Document Reviewed: 06/15/2014 Elsevier Interactive Patient Education  2017 [employer].

## 2022-05-12 DIAGNOSIS — N400 Enlarged prostate without lower urinary tract symptoms: Secondary | ICD-10-CM | POA: Diagnosis not present

## 2022-05-12 DIAGNOSIS — I2510 Atherosclerotic heart disease of native coronary artery without angina pectoris: Secondary | ICD-10-CM | POA: Diagnosis not present

## 2022-05-12 DIAGNOSIS — R3912 Poor urinary stream: Secondary | ICD-10-CM | POA: Diagnosis not present

## 2022-05-12 DIAGNOSIS — K2270 Barrett's esophagus without dysplasia: Secondary | ICD-10-CM | POA: Diagnosis not present

## 2022-05-12 DIAGNOSIS — E785 Hyperlipidemia, unspecified: Secondary | ICD-10-CM | POA: Diagnosis not present

## 2022-05-12 DIAGNOSIS — F3342 Major depressive disorder, recurrent, in full remission: Secondary | ICD-10-CM | POA: Diagnosis not present

## 2022-05-12 DIAGNOSIS — F419 Anxiety disorder, unspecified: Secondary | ICD-10-CM | POA: Diagnosis not present

## 2022-05-12 DIAGNOSIS — Z87891 Personal history of nicotine dependence: Secondary | ICD-10-CM | POA: Diagnosis not present

## 2022-05-12 DIAGNOSIS — R3915 Urgency of urination: Secondary | ICD-10-CM | POA: Diagnosis not present

## 2022-05-12 DIAGNOSIS — E669 Obesity, unspecified: Secondary | ICD-10-CM | POA: Diagnosis not present

## 2022-05-12 DIAGNOSIS — H3530 Unspecified macular degeneration: Secondary | ICD-10-CM | POA: Diagnosis not present

## 2022-05-12 DIAGNOSIS — I700 Atherosclerosis of aorta: Secondary | ICD-10-CM | POA: Diagnosis not present

## 2022-05-12 DIAGNOSIS — K219 Gastro-esophageal reflux disease without esophagitis: Secondary | ICD-10-CM | POA: Diagnosis not present

## 2022-05-12 DIAGNOSIS — I10 Essential (primary) hypertension: Secondary | ICD-10-CM | POA: Diagnosis not present

## 2022-05-12 DIAGNOSIS — N401 Enlarged prostate with lower urinary tract symptoms: Secondary | ICD-10-CM | POA: Diagnosis not present

## 2022-05-13 ENCOUNTER — Encounter: Payer: Self-pay | Admitting: Gastroenterology

## 2022-07-13 ENCOUNTER — Ambulatory Visit: Payer: PPO | Admitting: Gastroenterology

## 2022-07-13 ENCOUNTER — Encounter: Payer: Self-pay | Admitting: Gastroenterology

## 2022-07-13 VITALS — BP 118/68 | HR 86 | Ht 73.0 in | Wt 231.0 lb

## 2022-07-13 DIAGNOSIS — Z86010 Personal history of colonic polyps: Secondary | ICD-10-CM | POA: Diagnosis not present

## 2022-07-13 DIAGNOSIS — K2270 Barrett's esophagus without dysplasia: Secondary | ICD-10-CM

## 2022-07-13 MED ORDER — OMEPRAZOLE 20 MG PO CPDR: 40.0000 mg | DELAYED_RELEASE_CAPSULE | Freq: Every day | ORAL | 11 refills | Status: DC

## 2022-07-13 NOTE — Patient Instructions (Signed)
We have sent the following medications to your pharmacy for you to pick up at your convenience: omeprazole.   You have been scheduled for an endoscopy. Please follow written instructions given to you at your visit today. If you use inhalers (even only as needed), please bring them with you on the day of your procedure.  The [facility] [place] providers would like to encourage you to use [facility] to communicate with providers for non-urgent requests or questions.  Due to long hold times on the telephone, sending your provider a message by [facility] may be a faster and more efficient way to get a response.  Please allow 48 business hours for a response.  Please remember that this is for non-urgent requests.   Due to recent changes in healthcare laws, you may see the results of your imaging and laboratory studies on MyChart before your provider has had a chance to review them.  We understand that in some cases there may be results that are confusing or concerning to you. Not all laboratory results come back in the same time frame and the provider may be waiting for multiple results in order to interpret others.  Please give [place] 48 hours in order for your provider to thoroughly review all the results before contacting the office for clarification of your results.   Thank you for choosing me and [facility].  Pricilla Riffle. Dagoberto Ligas., MD., Marval Regal

## 2022-07-13 NOTE — Progress Notes (Signed)
Assessment    GERD, long segment Barrett's esophagus without dysplasia Personal history of adenomatous colon polyps, family history of colon cancer   Recommendations   We discussed option of discontinuing surveillance endoscopic procedures due to his age and he would like to continue surveillance for now which is reasonable given his good overall health.  Schedule EGD. The risks (including bleeding, perforation, infection, missed lesions, medication reactions and possible hospitalization or surgery if complications occur), benefits, and alternatives to endoscopy with possible biopsy and possible dilation were discussed with the patient and they consent to proceed.   Continue omeprazole 40 mg qam, Pepcid Complete qpm and follow antireflux measures Consider surveillance colonoscopy in May 2025    HPI   Chief complaint: Barrett's esophagus  Patient profile:  Oscar [name] is a 82 y.o. male referred by Walker Kehr MD for follow-up of long segment Barrett's esophagus without dysplasia.  Last EGD performed in May 2020 as below.  His reflux symptoms have been well-controlled. Denies weight loss, abdominal pain, constipation, diarrhea, change in stool caliber, melena, hematochezia, nausea, vomiting, dysphagia, chest pain.    Previous Labs / Imaging::    Latest Ref Rng & Units 08/05/2021   10:54 AM 11/23/2019    8:04 AM 05/11/2019   10:28 AM  CBC  WBC 4.0 - 10.5 K/uL 9.2  10.0  10.0   Hemoglobin 13.0 - 17.0 g/dL 13.9  14.4  13.9   Hematocrit 39.0 - 52.0 % 41.3  41.5  41.1   Platelets 150.0 - 400.0 K/uL 215.0  238.0  232.0     Lab Results  Component Value Date   LIPASE 24.0 11/23/2019      Latest Ref Rng & Units 08/05/2021   10:54 AM 02/12/2021    8:11 AM 07/17/2020   10:18 AM  CMP  Glucose 70 - 99 mg/dL 88  92  84   BUN 6 - 23 mg/dL 16  16  20   $ Creatinine 0.40 - 1.50 mg/dL 0.85  0.87  0.90   Sodium 135 - 145 mEq/L 138  141  137   Potassium 3.5 - 5.1 mEq/L 3.9  4.0  4.3    Chloride 96 - 112 mEq/L 100  104  100   CO2 19 - 32 mEq/L 28  28  29   $ Calcium 8.4 - 10.5 mg/dL 9.4  9.1  9.1   Total Protein 6.0 - 8.3 g/dL 6.9  7.0  6.7   Total Bilirubin 0.2 - 1.2 mg/dL 0.7  0.6  0.6   Alkaline Phos 39 - 117 U/L 69  69  65   AST 0 - 37 U/L 25  23  30   $ ALT 0 - 53 U/L 32  30  44      Previous GI evaluation    Endoscopies:  EGD May 2020   Colonoscopy May 2020   Imaging:  DG Chest 2 View CLINICAL DATA:  Unintentional weight loss.  EXAM: CHEST - 2 VIEW  COMPARISON:  02/26/2016  FINDINGS: Heart size is normal. Ordinary mild aortic atherosclerotic calcification is noted. Pulmonary vascularity is normal. The lungs are clear. No effusions. No significant bone finding.  IMPRESSION: No active cardiopulmonary disease. Ordinary aortic atherosclerotic calcification.  Electronically Signed   By: Nelson Chimes M.D.   On: 11/23/2019 15:51    Past Medical History:  Diagnosis Date   Adenomatous colon polyp    Allergic rhinitis, cause unspecified    Allergy    Anxiety  Anxiety state, unspecified    Barrett esophagus    Barrett esophagus    BPH (benign prostatic hypertrophy)    BX 2007   Cancer ([place])    melanoma   Cataract    bilaterally removed   Depression    Depressive disorder, not elsewhere classified    GERD (gastroesophageal reflux disease)    Hypertension    Hypogonadism male    Microhematuria    Neuromuscular disorder ([place])    small HH 05-26-2012 egd    Other and unspecified hyperlipidemia    Rosacea    UTI (lower urinary tract infection) 2009   Past Surgical History:  Procedure Laterality Date   CATARACT EXTRACTION  05/10/2012   left   COLONOSCOPY  04-22-2010   ESOPHAGOGASTRODUODENOSCOPY  05-26-2012   Yoav Okane   HAND SURGERY Left    MELANOMA EXCISION     POLYPECTOMY     prev colon TA polyps- no polyps in 2011   PROSTATE BIOPSY  2011   Dr Winifred Olive    UPPER GASTROINTESTINAL ENDOSCOPY     Family History  Problem Relation  Age of Onset   Hypertension Mother    Cancer Mother        colon ca   Colon cancer Mother 41   Heart disease Father        MI   Coronary artery disease Other    Stomach cancer Maternal Grandfather    Colon polyps Neg Hx    Esophageal cancer Neg Hx    Rectal cancer Neg Hx    Social History   Tobacco Use   Smoking status: Former    Packs/day: 1.50    Years: 20.00    Total pack years: 30.00    Types: Cigarettes    Quit date: 05/25/1978    Years since quitting: 44.1   Smokeless tobacco: Never  Vaping Use   Vaping Use: Never used  Substance Use Topics   Alcohol use: Yes    Alcohol/week: 7.0 standard drinks of alcohol    Types: 7 Standard drinks or equivalent per week    Comment: 2 drinks per day, wine and mixed drinks   Drug use: No   Current Outpatient Medications  Medication Sig Dispense Refill   amLODipine-valsartan (EXFORGE) 10-320 MG tablet Take 1 tablet by mouth daily. Take 1 tablet by mouth daily.     ARIPiprazole (ABILIFY) 10 MG tablet Take 10 mg by mouth daily.     aspirin 325 MG tablet Take 325 mg by mouth daily.     atorvastatin (LIPITOR) 40 MG tablet TAKE 1 TABLET(40 MG) BY MOUTH DAILY 90 tablet 3   azithromycin (ZITHROMAX Z-PAK) 250 MG tablet As directed 6 tablet 0   benzonatate (TESSALON) 200 MG capsule Take 1 capsule (200 mg total) by mouth 3 (three) times daily as needed for cough. 20 capsule 0   budesonide-formoterol (SYMBICORT) 80-4.5 MCG/ACT inhaler Inhale 2 puffs into the lungs 2 (two) times daily. 1 each 11   Cholecalciferol (RA VITAMIN D-3) 25 MCG (1000 UT) tablet Take 1 tablet (1,000 Units total) by mouth daily. 90 tablet 3   clobetasol (TEMOVATE) 0.05 % external solution Apply 1 application topically 2 (two) times daily. Use for scalp lesions 50 mL 3   desvenlafaxine (PRISTIQ) 100 MG 24 hr tablet      dutasteride (AVODART) 0.5 MG capsule TK 1 C PO QD     famotidine-calcium carbonate-magnesium hydroxide (PEPCID COMPLETE) 10-800-165 MG chewable tablet Chew  1 tablet by mouth daily as needed.  multivitamin-lutein (OCUVITE-LUTEIN) CAPS capsule Take 1 capsule daily by mouth.     omeprazole (PRILOSEC) 20 MG capsule Take 40 mg by mouth daily.     sertraline (ZOLOFT) 100 MG tablet Take 100 mg by mouth daily.     tamsulosin (FLOMAX) 0.4 MG CAPS capsule Take 0.4 mg by mouth daily.     tiZANidine (ZANAFLEX) 4 MG tablet Take 1 tablet (4 mg total) by mouth every 8 (eight) hours as needed for muscle spasms (back pain). 60 tablet 1   torsemide (DEMADEX) 5 MG tablet Take 5 mg by mouth daily. Take 5 mg by mouth daily.     No current facility-administered medications for this visit.   Allergies  Allergen Reactions   Celecoxib     REACTION: extreme whelps,itching   Penicillins     REACTION: lightheaded faint   Rofecoxib     REACTION: causes rash    Review of Systems: All other systems reviewed and negative except where noted in HPI.    Physical Exam    Wt Readings from Last 3 Encounters:  07/13/22 231 lb (104.8 kg)  02/05/22 231 lb 12.8 oz (105.1 kg)  08/05/21 232 lb 6 oz (105.4 kg)    BP 118/68   Pulse 86   Ht 6' 1"$  (1.854 m)   Wt 231 lb (104.8 kg)   SpO2 98%   BMI 30.48 kg/m  Constitutional:  Generally well appearing male in no acute distress. HEENT: Pupils normal.  Conjunctivae are normal. No scleral icterus. No oral lesions or deformities noted.  Neck: Supple.  Cardiac: Normal rate, regular rhythm without murmurs. Pulmonary/chest: Effort normal and breath sounds normal. No wheezing, rales or rhonchi. Abdominal: Soft, nondistended, nontender. Active bowel sounds. No palpable HSM, masses or hernias. Rectal: Not done Extremities: No edema or deformities noted Neurological: Alert and oriented to person, place and time. Psychiatric: Pleasant. Normal mood and affect. Behavior is normal. Skin: Skin is warm and dry. No rashes noted.  Lucio Edward, MD   cc:  Referring Provider Plotnikov, Evie Lacks, MD

## 2022-07-24 DIAGNOSIS — R3915 Urgency of urination: Secondary | ICD-10-CM | POA: Diagnosis not present

## 2022-07-24 DIAGNOSIS — N401 Enlarged prostate with lower urinary tract symptoms: Secondary | ICD-10-CM | POA: Diagnosis not present

## 2022-07-27 DIAGNOSIS — R0602 Shortness of breath: Secondary | ICD-10-CM | POA: Diagnosis not present

## 2022-07-27 DIAGNOSIS — I10 Essential (primary) hypertension: Secondary | ICD-10-CM | POA: Diagnosis not present

## 2022-07-27 DIAGNOSIS — I4719 Other supraventricular tachycardia: Secondary | ICD-10-CM | POA: Diagnosis not present

## 2022-07-27 DIAGNOSIS — I517 Cardiomegaly: Secondary | ICD-10-CM | POA: Diagnosis not present

## 2022-07-28 ENCOUNTER — Encounter: Payer: Self-pay | Admitting: Certified Registered Nurse Anesthetist

## 2022-07-30 ENCOUNTER — Ambulatory Visit (AMBULATORY_SURGERY_CENTER): Payer: PPO | Admitting: Gastroenterology

## 2022-07-30 ENCOUNTER — Encounter: Payer: Self-pay | Admitting: Gastroenterology

## 2022-07-30 VITALS — BP 125/77 | HR 82 | Temp 98.0°F | Resp 18 | Ht 73.0 in | Wt 231.0 lb

## 2022-07-30 DIAGNOSIS — K2270 Barrett's esophagus without dysplasia: Secondary | ICD-10-CM | POA: Diagnosis not present

## 2022-07-30 DIAGNOSIS — F419 Anxiety disorder, unspecified: Secondary | ICD-10-CM | POA: Diagnosis not present

## 2022-07-30 DIAGNOSIS — E7800 Pure hypercholesterolemia, unspecified: Secondary | ICD-10-CM | POA: Diagnosis not present

## 2022-07-30 DIAGNOSIS — Z0389 Encounter for observation for other suspected diseases and conditions ruled out: Secondary | ICD-10-CM | POA: Diagnosis not present

## 2022-07-30 MED ORDER — SODIUM CHLORIDE 0.9 % IV SOLN: 500.0000 mL | Freq: Once | INTRAVENOUS | Status: DC

## 2022-07-30 NOTE — Progress Notes (Signed)
Called to room to assist during endoscopic procedure.  Patient ID and intended procedure confirmed with present staff. Received instructions for my participation in the procedure from the performing physician.  

## 2022-07-30 NOTE — Progress Notes (Signed)
1023 Robinul 0.1 mg IV given due large amount of secretions upon assessment.  MD made aware, vss

## 2022-07-30 NOTE — Progress Notes (Signed)
Report given to PACU, vss 

## 2022-07-30 NOTE — Op Note (Signed)
[place] Patient Name: Oscar [name] Procedure Date: 07/30/2022 10:22 AM MRN: [id number] Endoscopist: Ladene Artist , MD, [id number] Age: 82 Referring MD:  Date of Birth: [birth year] Gender: Male Account #: [id number] Procedure:                Upper GI endoscopy Indications:              Surveillance for malignancy due to personal history                            of Barrett's esophagus Medicines:                Monitored Anesthesia Care Procedure:                Pre-Anesthesia Assessment:                           - Prior to the procedure, a History and Physical                            was performed, and patient medications and                            allergies were reviewed. The patient's tolerance of                            previous anesthesia was also reviewed. The risks                            and benefits of the procedure and the sedation                            options and risks were discussed with the patient.                            All questions were answered, and informed consent                            was obtained. Prior Anticoagulants: The patient has                            taken no anticoagulant or antiplatelet agents. ASA                            Grade Assessment: II - A patient with mild systemic                            disease. After reviewing the risks and benefits,                            the patient was deemed in satisfactory condition to                            undergo the procedure.  After obtaining informed consent, the endoscope was                            passed under direct vision. Throughout the                            procedure, the patient's blood pressure, pulse, and                            oxygen saturations were monitored continuously. The                            Olympus Scope [id number] was introduced through the                            mouth, and advanced to the  second part of duodenum.                            The upper GI endoscopy was accomplished without                            difficulty. The patient tolerated the procedure                            well. Scope In: Scope Out: Findings:                 There were esophageal mucosal changes secondary to                            established long-segment Barrett's disease present                            in the distal esophagus. The maximum longitudinal                            extent of these mucosal changes was 4 cm in length.                            Mucosa was biopsied with a cold forceps for                            histology in 4 quadrants at intervals of 1 cm in                            the lower third of the esophagus. One specimen                            bottle was sent to pathology.                           A single area of ectopic gastric mucosa was found  in the proximal esophagus.                           The exam of the esophagus was otherwise normal.                           A medium-sized hiatal hernia was present.                           Patchy mildly erythematous mucosa without bleeding                            was found in the gastric body and in the gastric                            antrum.                           The exam of the stomach was otherwise normal.                           A 10 mm non-bleeding diverticulum was found in the                            area of the papilla.                           The exam of the duodenum was otherwise normal. Complications:            No immediate complications. Estimated Blood Loss:     Estimated blood loss was minimal. Impression:               - Esophageal mucosal changes secondary to                            established long-segment Barrett's disease.                            Biopsied.                           - Esophageal inlet patch.                           -  Medium-sized hiatal hernia.                           - Erythematous mucosa in the gastric body and                            antrum.                           - Non-bleeding duodenal diverticulum. Recommendation:           - Patient has a contact number available for  emergencies. The signs and symptoms of potential                            delayed complications were discussed with the                            patient. Return to normal activities tomorrow.                            Written discharge instructions were provided to the                            patient.                           - Resume previous diet.                           - Follow antireflux measures.                           - Continue present medications.                           - Await pathology results.                           - Consider repeat upper endoscopy after studies are                            complete for surveillance of Barrett's esophagus. Ladene Artist, MD 07/30/2022 10:42:18 AM This report has been signed electronically.

## 2022-07-30 NOTE — Progress Notes (Signed)
VS completed by Randall Hiss    Pt's states no medical or surgical changes since previsit or office visit.

## 2022-07-30 NOTE — Progress Notes (Signed)
See 07/13/2022 H&P, no changes

## 2022-07-30 NOTE — Patient Instructions (Signed)
 **  Handout given on diverticulosis**  YOU HAD AN ENDOSCOPIC PROCEDURE TODAY AT [place]:   Refer to the procedure report that was given to you for any specific questions about what was found during the examination.  If the procedure report does not answer your questions, please call your gastroenterologist to clarify.  If you requested that your care partner not be given the details of your procedure findings, then the procedure report has been included in a sealed envelope for you to review at your convenience later.  YOU SHOULD EXPECT: Some feelings of bloating in the abdomen. Passage of more gas than usual.  Walking can help get rid of the air that was put into your GI tract during the procedure and reduce the bloating. If you had a lower endoscopy (such as a colonoscopy or flexible sigmoidoscopy) you may notice spotting of blood in your stool or on the toilet paper. If you underwent a bowel prep for your procedure, you may not have a normal bowel movement for a few days.  Please Note:  You might notice some irritation and congestion in your nose or some drainage.  This is from the oxygen used during your procedure.  There is no need for concern and it should clear up in a day or so.  SYMPTOMS TO REPORT IMMEDIATELY:  Following upper endoscopy (EGD)  Vomiting of blood or coffee ground material  New chest pain or pain under the shoulder blades  Painful or persistently difficult swallowing  New shortness of breath  Fever of 100F or higher  Black, tarry-looking stools  For urgent or emergent issues, a gastroenterologist can be reached at any hour by calling [phone]. Do not use MyChart messaging for urgent concerns.    DIET:  We do recommend a small meal at first, but then you may proceed to your regular diet.  Drink plenty of fluids but you should avoid alcoholic beverages for 24 hours.  ACTIVITY:  You should plan to take it easy for the rest of today and you  should NOT DRIVE or use heavy machinery until tomorrow (because of the sedation medicines used during the test).    FOLLOW UP: Our staff will call the number listed on your records the next business day following your procedure.  We will call around 7:15- 8:00 am to check on you and address any questions or concerns that you may have regarding the information given to you following your procedure. If we do not reach you, we will leave a message.     If any biopsies were taken you will be contacted by phone or by letter within the next 1-3 weeks.  Please call us at [phone] if you have not heard about the biopsies in 3 weeks.    SIGNATURES/CONFIDENTIALITY: You and/or your care partner have signed paperwork which will be entered into your electronic medical record.  These signatures attest to the fact that that the information above on your After Visit Summary has been reviewed and is understood.  Full responsibility of the confidentiality of this discharge information lies with you and/or your care-partner.

## 2022-07-31 ENCOUNTER — Telehealth: Payer: Self-pay

## 2022-07-31 NOTE — Telephone Encounter (Signed)
  Follow up Call-     07/30/2022    9:08 AM  Call back number  Post procedure Call Back phone  # [phone]  Permission to leave phone message Yes     Patient questions:  Do you have a fever, pain , or abdominal swelling? No. Pain Score  0 *  Have you tolerated food without any problems? Yes.    Have you been able to return to your normal activities? Yes.    Do you have any questions about your discharge instructions: Diet   No. Medications  No. Follow up visit  No.  Do you have questions or concerns about your Care? No.  Actions: * If pain score is 4 or above: No action needed, pain <4.

## 2022-08-06 ENCOUNTER — Ambulatory Visit (INDEPENDENT_AMBULATORY_CARE_PROVIDER_SITE_OTHER): Payer: PPO | Admitting: Internal Medicine

## 2022-08-06 ENCOUNTER — Encounter: Payer: Self-pay | Admitting: Internal Medicine

## 2022-08-06 VITALS — BP 120/74 | HR 77 | Temp 98.2°F | Ht 73.0 in | Wt 232.4 lb

## 2022-08-06 DIAGNOSIS — I10 Essential (primary) hypertension: Secondary | ICD-10-CM

## 2022-08-06 DIAGNOSIS — F4323 Adjustment disorder with mixed anxiety and depressed mood: Secondary | ICD-10-CM | POA: Diagnosis not present

## 2022-08-06 DIAGNOSIS — R739 Hyperglycemia, unspecified: Secondary | ICD-10-CM | POA: Diagnosis not present

## 2022-08-06 DIAGNOSIS — R0609 Other forms of dyspnea: Secondary | ICD-10-CM

## 2022-08-06 DIAGNOSIS — E785 Hyperlipidemia, unspecified: Secondary | ICD-10-CM

## 2022-08-06 DIAGNOSIS — K2270 Barrett's esophagus without dysplasia: Secondary | ICD-10-CM | POA: Diagnosis not present

## 2022-08-06 NOTE — Assessment & Plan Note (Signed)
Cont on Olmesartan HCT, Demadex

## 2022-08-06 NOTE — Assessment & Plan Note (Signed)
Check A1c. 

## 2022-08-06 NOTE — Assessment & Plan Note (Signed)
S/p recent EGD

## 2022-08-06 NOTE — Assessment & Plan Note (Signed)
Cont on Lipitor 

## 2022-08-06 NOTE — Assessment & Plan Note (Signed)
Much better 

## 2022-08-06 NOTE — Assessment & Plan Note (Addendum)
F/u w/Dr [facility] On [place], Zoloft, Abilify

## 2022-08-06 NOTE — Progress Notes (Signed)
Subjective:  Patient ID: Oscar [name], male    DOB: 13-Jun-1940  Age: 82 y.o. MRN: [id number]  CC: Annual Exam   HPI Oscar [name] presents for HTN, dyslipidemia, depression SOB is much better  Outpatient Medications Prior to Visit  Medication Sig Dispense Refill   amLODipine-valsartan (EXFORGE) 10-320 MG tablet Take 1 tablet by mouth daily. Take 1 tablet by mouth daily.     ARIPiprazole (ABILIFY) 10 MG tablet Take 10 mg by mouth daily.     aspirin 325 MG tablet Take 325 mg by mouth daily.     atorvastatin (LIPITOR) 40 MG tablet TAKE 1 TABLET(40 MG) BY MOUTH DAILY 90 tablet 3   Cholecalciferol (RA VITAMIN D-3) 25 MCG (1000 UT) tablet Take 1 tablet (1,000 Units total) by mouth daily. 90 tablet 3   clobetasol (TEMOVATE) 0.05 % external solution Apply 1 application topically 2 (two) times daily. Use for scalp lesions 50 mL 3   desvenlafaxine (PRISTIQ) 100 MG 24 hr tablet      dutasteride (AVODART) 0.5 MG capsule TK 1 C PO QD     famotidine-calcium carbonate-magnesium hydroxide (PEPCID COMPLETE) 10-800-165 MG chewable tablet Chew 1 tablet by mouth daily as needed.     multivitamin-lutein (OCUVITE-LUTEIN) CAPS capsule Take 1 capsule daily by mouth.     omeprazole (PRILOSEC) 20 MG capsule Take 2 capsules (40 mg total) by mouth daily. 30 capsule 11   sertraline (ZOLOFT) 100 MG tablet Take 100 mg by mouth daily.     tamsulosin (FLOMAX) 0.4 MG CAPS capsule Take 0.4 mg by mouth daily.     tiZANidine (ZANAFLEX) 4 MG tablet Take 1 tablet (4 mg total) by mouth every 8 (eight) hours as needed for muscle spasms (back pain). 60 tablet 1   torsemide (DEMADEX) 5 MG tablet Take 5 mg by mouth daily. Take 5 mg by mouth daily.     azithromycin (ZITHROMAX Z-PAK) 250 MG tablet As directed (Patient not taking: Reported on 07/30/2022) 6 tablet 0   benzonatate (TESSALON) 200 MG capsule Take 1 capsule (200 mg total) by mouth 3 (three) times daily as needed for cough. (Patient not taking: Reported on 07/30/2022) 20  capsule 0   budesonide-formoterol (SYMBICORT) 80-4.5 MCG/ACT inhaler Inhale 2 puffs into the lungs 2 (two) times daily. (Patient not taking: Reported on 07/30/2022) 1 each 11   No facility-administered medications prior to visit.    ROS: Review of Systems  Constitutional:  Negative for appetite change, fatigue and unexpected weight change.  HENT:  Negative for congestion, nosebleeds, sneezing, sore throat and trouble swallowing.   Eyes:  Negative for itching and visual disturbance.  Respiratory:  Negative for cough.   Cardiovascular:  Negative for chest pain, palpitations and leg swelling.  Gastrointestinal:  Negative for abdominal distention, blood in stool, diarrhea and nausea.  Genitourinary:  Negative for frequency and hematuria.  Musculoskeletal:  Negative for back pain, gait problem, joint swelling and neck pain.  Skin:  Negative for rash.  Neurological:  Negative for dizziness, tremors, speech difficulty and weakness.  Psychiatric/Behavioral:  Negative for agitation, dysphoric mood and sleep disturbance. The patient is not nervous/anxious.     Objective:  BP 120/74 (BP Location: Left Arm, Patient Position: Sitting, Cuff Size: Large)   Pulse 77   Temp 98.2 F (36.8 C) (Oral)   Ht '6\' 1"'$  (1.854 m)   Wt 232 lb 6.4 oz (105.4 kg)   SpO2 97%   BMI 30.66 kg/m   BP Readings from Last 3 Encounters:  08/06/22 120/74  07/30/22 125/77  07/13/22 118/68    Wt Readings from Last 3 Encounters:  08/06/22 232 lb 6.4 oz (105.4 kg)  07/30/22 231 lb (104.8 kg)  07/13/22 231 lb (104.8 kg)    Physical Exam Constitutional:      General: He is not in acute distress.    Appearance: He is well-developed. He is obese.     Comments: NAD  Eyes:     Conjunctiva/sclera: Conjunctivae normal.     Pupils: Pupils are equal, round, and reactive to light.  Neck:     Thyroid: No thyromegaly.     Vascular: No JVD.  Cardiovascular:     Rate and Rhythm: Normal rate and regular rhythm.     Heart  sounds: Normal heart sounds. No murmur heard.    No friction rub. No gallop.  Pulmonary:     Effort: Pulmonary effort is normal. No respiratory distress.     Breath sounds: Normal breath sounds. No wheezing or rales.  Chest:     Chest wall: No tenderness.  Abdominal:     General: Bowel sounds are normal. There is no distension.     Palpations: Abdomen is soft. There is no mass.     Tenderness: There is no abdominal tenderness. There is no guarding or rebound.  Musculoskeletal:        General: No tenderness. Normal range of motion.     Cervical back: Normal range of motion.  Lymphadenopathy:     Cervical: No cervical adenopathy.  Skin:    General: Skin is warm and dry.     Findings: No rash.  Neurological:     Mental Status: He is alert and oriented to person, place, and time.     Cranial Nerves: No cranial nerve deficit.     Motor: No abnormal muscle tone.     Coordination: Coordination normal.     Gait: Gait normal.     Deep Tendon Reflexes: Reflexes are normal and symmetric.  Psychiatric:        Behavior: Behavior normal.        Thought Content: Thought content normal.        Judgment: Judgment normal.    No edema  Lab Results  Component Value Date   WBC 9.2 08/05/2021   HGB 13.9 08/05/2021   HCT 41.3 08/05/2021   PLT 215.0 08/05/2021   GLUCOSE 88 08/05/2021   CHOL 165 02/12/2021   TRIG 165.0 (H) 02/12/2021   HDL 56.40 02/12/2021   LDLDIRECT 78.0 07/17/2020   LDLCALC 76 02/12/2021   ALT 32 08/05/2021   AST 25 08/05/2021   NA 138 08/05/2021   K 3.9 08/05/2021   CL 100 08/05/2021   CREATININE 0.85 08/05/2021   BUN 16 08/05/2021   CO2 28 08/05/2021   TSH 1.87 08/05/2021   PSA 0.75 07/17/2020   HGBA1C 5.8 05/11/2019    US Abdomen Complete  Result Date: 05/17/2019 CLINICAL DATA:  Nausea EXAM: ABDOMEN ULTRASOUND COMPLETE COMPARISON:  01/03/2010.  CT 04/27/2016 FINDINGS: Gallbladder: No gallstones or wall thickening visualized. No sonographic Murphy sign noted  by sonographer. Common bile duct: Diameter: Normal caliber, 5 mm Liver: Diffusely increased echotexture compatible with fatty infiltration. 9 mm cyst in the left lobe. No biliary ductal dilatation. Portal vein is patent on color Doppler imaging with normal direction of blood flow towards the liver. IVC: No abnormality visualized. Pancreas: Visualized portion unremarkable. Spleen: Size and appearance within normal limits. Right Kidney: Length: 12.0 cm. Echogenicity within normal  limits. No mass or hydronephrosis visualized. Left Kidney: Length: 11.1 cm. Echogenicity within normal limits. No mass or hydronephrosis visualized. Abdominal aorta: No aneurysm visualized. Other findings: None. IMPRESSION: Diffuse fatty infiltration of the liver. Electronically Signed   By: Rolm Baptise M.D.   On: 05/17/2019 11:23    Assessment & Plan:   Problem List Items Addressed This Visit       Cardiovascular and Mediastinum   Essential hypertension, benign    Cont on Olmesartan HCT, Demadex        Digestive   Barrett's esophagus    S/p recent EGD        Other   Hyperglycemia - Primary    Check A1c      Dyslipidemia    Cont on Lipitor      DOE (dyspnea on exertion)    Much better      Adjustment disorder with mixed anxiety and depressed mood    F/u w/Dr [facility] [place], Zoloft, Abilify         No orders of the defined types were placed in this encounter.     Follow-up: Return in about 6 months (around 02/06/2023) for a follow-up visit.  Walker Kehr, MD

## 2022-08-12 DIAGNOSIS — N3941 Urge incontinence: Secondary | ICD-10-CM | POA: Diagnosis not present

## 2022-08-12 DIAGNOSIS — N401 Enlarged prostate with lower urinary tract symptoms: Secondary | ICD-10-CM | POA: Diagnosis not present

## 2022-08-12 DIAGNOSIS — R3912 Poor urinary stream: Secondary | ICD-10-CM | POA: Diagnosis not present

## 2022-08-19 DIAGNOSIS — R350 Frequency of micturition: Secondary | ICD-10-CM | POA: Diagnosis not present

## 2022-08-19 DIAGNOSIS — R3915 Urgency of urination: Secondary | ICD-10-CM | POA: Diagnosis not present

## 2022-09-02 DIAGNOSIS — L821 Other seborrheic keratosis: Secondary | ICD-10-CM | POA: Diagnosis not present

## 2022-09-02 DIAGNOSIS — R229 Localized swelling, mass and lump, unspecified: Secondary | ICD-10-CM | POA: Diagnosis not present

## 2022-09-02 DIAGNOSIS — Z85820 Personal history of malignant melanoma of skin: Secondary | ICD-10-CM | POA: Diagnosis not present

## 2022-09-02 DIAGNOSIS — Z08 Encounter for follow-up examination after completed treatment for malignant neoplasm: Secondary | ICD-10-CM | POA: Diagnosis not present

## 2022-09-02 DIAGNOSIS — C4359 Malignant melanoma of other part of trunk: Secondary | ICD-10-CM | POA: Diagnosis not present

## 2022-09-02 DIAGNOSIS — C44712 Basal cell carcinoma of skin of right lower limb, including hip: Secondary | ICD-10-CM | POA: Diagnosis not present

## 2022-09-02 DIAGNOSIS — D485 Neoplasm of uncertain behavior of skin: Secondary | ICD-10-CM | POA: Diagnosis not present

## 2022-09-02 DIAGNOSIS — D225 Melanocytic nevi of trunk: Secondary | ICD-10-CM | POA: Diagnosis not present

## 2022-10-06 DIAGNOSIS — C44712 Basal cell carcinoma of skin of right lower limb, including hip: Secondary | ICD-10-CM | POA: Diagnosis not present

## 2022-10-06 DIAGNOSIS — I872 Venous insufficiency (chronic) (peripheral): Secondary | ICD-10-CM | POA: Diagnosis not present

## 2022-10-12 ENCOUNTER — Other Ambulatory Visit: Payer: Self-pay | Admitting: Internal Medicine

## 2022-11-05 DIAGNOSIS — R3912 Poor urinary stream: Secondary | ICD-10-CM | POA: Diagnosis not present

## 2022-11-05 DIAGNOSIS — N401 Enlarged prostate with lower urinary tract symptoms: Secondary | ICD-10-CM | POA: Diagnosis not present

## 2022-12-03 ENCOUNTER — Encounter: Payer: Self-pay | Admitting: Gastroenterology

## 2022-12-03 DIAGNOSIS — L309 Dermatitis, unspecified: Secondary | ICD-10-CM | POA: Diagnosis not present

## 2023-01-07 ENCOUNTER — Encounter (INDEPENDENT_AMBULATORY_CARE_PROVIDER_SITE_OTHER): Payer: Self-pay

## 2023-01-29 DIAGNOSIS — I517 Cardiomegaly: Secondary | ICD-10-CM | POA: Diagnosis not present

## 2023-01-29 DIAGNOSIS — I10 Essential (primary) hypertension: Secondary | ICD-10-CM | POA: Diagnosis not present

## 2023-01-29 DIAGNOSIS — I4719 Other supraventricular tachycardia: Secondary | ICD-10-CM | POA: Diagnosis not present

## 2023-01-29 DIAGNOSIS — I340 Nonrheumatic mitral (valve) insufficiency: Secondary | ICD-10-CM | POA: Diagnosis not present

## 2023-01-29 DIAGNOSIS — R030 Elevated blood-pressure reading, without diagnosis of hypertension: Secondary | ICD-10-CM | POA: Diagnosis not present

## 2023-02-02 DIAGNOSIS — R059 Cough, unspecified: Secondary | ICD-10-CM | POA: Diagnosis not present

## 2023-02-02 DIAGNOSIS — H6123 Impacted cerumen, bilateral: Secondary | ICD-10-CM | POA: Diagnosis not present

## 2023-02-03 ENCOUNTER — Encounter: Payer: Self-pay | Admitting: Internal Medicine

## 2023-02-03 ENCOUNTER — Ambulatory Visit (INDEPENDENT_AMBULATORY_CARE_PROVIDER_SITE_OTHER): Payer: PPO

## 2023-02-03 ENCOUNTER — Ambulatory Visit (INDEPENDENT_AMBULATORY_CARE_PROVIDER_SITE_OTHER): Payer: PPO | Admitting: Internal Medicine

## 2023-02-03 VITALS — BP 110/70 | HR 83 | Temp 98.3°F | Ht 73.0 in | Wt 228.4 lb

## 2023-02-03 DIAGNOSIS — J069 Acute upper respiratory infection, unspecified: Secondary | ICD-10-CM

## 2023-02-03 DIAGNOSIS — E785 Hyperlipidemia, unspecified: Secondary | ICD-10-CM | POA: Diagnosis not present

## 2023-02-03 DIAGNOSIS — R739 Hyperglycemia, unspecified: Secondary | ICD-10-CM | POA: Diagnosis not present

## 2023-02-03 DIAGNOSIS — I700 Atherosclerosis of aorta: Secondary | ICD-10-CM | POA: Diagnosis not present

## 2023-02-03 DIAGNOSIS — N320 Bladder-neck obstruction: Secondary | ICD-10-CM | POA: Diagnosis not present

## 2023-02-03 MED ORDER — HYDROCODONE BIT-HOMATROP MBR 5-1.5 MG/5ML PO SOLN: 5.0000 mL | Freq: Three times a day (TID) | ORAL | 0 refills | Status: DC | PRN

## 2023-02-03 MED ORDER — CEFDINIR 300 MG PO CAPS: 300.0000 mg | ORAL_CAPSULE | Freq: Two times a day (BID) | ORAL | 0 refills | Status: DC

## 2023-02-03 NOTE — Assessment & Plan Note (Signed)
Check A1c. 

## 2023-02-03 NOTE — Assessment & Plan Note (Signed)
Cont on Lipitor 

## 2023-02-03 NOTE — Progress Notes (Signed)
Subjective:  Patient ID: Oscar [name], male    DOB: 04/23/1941  Age: 82 y.o. MRN: [id number]  CC: No chief complaint on file.   HPI Oscar [name] presents for URI/bronchitis x 1 wk - worse Grey mucus, poor sleep. COVID (-)  F/u HTN, dyslipidemia  Outpatient Medications Prior to Visit  Medication Sig Dispense Refill   amLODipine-valsartan (EXFORGE) 10-320 MG tablet Take 1 tablet by mouth daily. Take 1 tablet by mouth daily.     ARIPiprazole (ABILIFY) 10 MG tablet Take 10 mg by mouth daily.     aspirin 325 MG tablet Take 325 mg by mouth daily.     atorvastatin (LIPITOR) 40 MG tablet TAKE 1 TABLET(40 MG) BY MOUTH DAILY 90 tablet 3   Cholecalciferol (RA VITAMIN D-3) 25 MCG (1000 UT) tablet Take 1 tablet (1,000 Units total) by mouth daily. 90 tablet 3   clobetasol (TEMOVATE) 0.05 % external solution Apply 1 application topically 2 (two) times daily. Use for scalp lesions 50 mL 3   desvenlafaxine (PRISTIQ) 100 MG 24 hr tablet      dutasteride (AVODART) 0.5 MG capsule TK 1 C PO QD     famotidine-calcium carbonate-magnesium hydroxide (PEPCID COMPLETE) 10-800-165 MG chewable tablet Chew 1 tablet by mouth daily as needed.     multivitamin-lutein (OCUVITE-LUTEIN) CAPS capsule Take 1 capsule daily by mouth.     omeprazole (PRILOSEC) 20 MG capsule Take 2 capsules (40 mg total) by mouth daily. 30 capsule 11   sertraline (ZOLOFT) 100 MG tablet Take 100 mg by mouth daily.     tamsulosin (FLOMAX) 0.4 MG CAPS capsule Take 0.4 mg by mouth daily.     tiZANidine (ZANAFLEX) 4 MG tablet Take 1 tablet (4 mg total) by mouth every 8 (eight) hours as needed for muscle spasms (back pain). 60 tablet 1   torsemide (DEMADEX) 5 MG tablet Take 5 mg by mouth daily. Take 5 mg by mouth daily.     No facility-administered medications prior to visit.    ROS: Review of Systems  Constitutional:  Positive for chills and fatigue. Negative for appetite change and unexpected weight change.  HENT:  Positive for  congestion, hearing loss, sinus pressure, sinus pain, sore throat and voice change. Negative for nosebleeds, sneezing and trouble swallowing.   Eyes:  Negative for itching and visual disturbance.  Respiratory:  Positive for cough and wheezing. Negative for shortness of breath.   Cardiovascular:  Negative for chest pain, palpitations and leg swelling.  Gastrointestinal:  Negative for abdominal distention, blood in stool, diarrhea and nausea.  Genitourinary:  Negative for frequency and hematuria.  Musculoskeletal:  Negative for back pain, gait problem, joint swelling and neck pain.  Skin:  Negative for rash.  Neurological:  Negative for dizziness, tremors, speech difficulty and weakness.  Psychiatric/Behavioral:  Positive for sleep disturbance. Negative for agitation and dysphoric mood. The patient is not nervous/anxious.     Objective:  BP 110/70 (BP Location: Left Arm, Patient Position: Sitting, Cuff Size: Normal)   Pulse 83   Temp 98.3 F (36.8 C) (Oral)   Ht 6\' 1"  (1.854 m)   Wt 228 lb 6.4 oz (103.6 kg)   SpO2 96%   BMI 30.13 kg/m   BP Readings from Last 3 Encounters:  02/03/23 110/70  08/06/22 120/74  07/30/22 125/77    Wt Readings from Last 3 Encounters:  02/03/23 228 lb 6.4 oz (103.6 kg)  08/06/22 232 lb 6.4 oz (105.4 kg)  07/30/22 231 lb (104.8 kg)  Physical Exam Constitutional:      General: He is not in acute distress.    Appearance: He is well-developed. He is obese. He is ill-appearing. He is not toxic-appearing.     Comments: NAD  HENT:     Right Ear: There is no impacted cerumen.     Left Ear: There is no impacted cerumen.     Nose: Congestion and rhinorrhea present.     Mouth/Throat:     Pharynx: Oropharyngeal exudate and posterior oropharyngeal erythema present.  Eyes:     Conjunctiva/sclera: Conjunctivae normal.     Pupils: Pupils are equal, round, and reactive to light.  Neck:     Thyroid: No thyromegaly.     Vascular: No JVD.  Cardiovascular:      Rate and Rhythm: Normal rate and regular rhythm.     Heart sounds: Normal heart sounds. No murmur heard.    No friction rub. No gallop.  Pulmonary:     Effort: Pulmonary effort is normal. No respiratory distress.     Breath sounds: Rhonchi present. No wheezing or rales.  Chest:     Chest wall: No tenderness.  Abdominal:     General: Bowel sounds are normal. There is no distension.     Palpations: Abdomen is soft. There is no mass.     Tenderness: There is no abdominal tenderness. There is no guarding or rebound.  Musculoskeletal:        General: No tenderness. Normal range of motion.     Cervical back: Normal range of motion.  Lymphadenopathy:     Cervical: No cervical adenopathy.  Skin:    General: Skin is warm and dry.     Findings: No rash.  Neurological:     Mental Status: He is alert and oriented to person, place, and time.     Cranial Nerves: No cranial nerve deficit.     Motor: No abnormal muscle tone.     Coordination: Coordination normal.     Gait: Gait normal.     Deep Tendon Reflexes: Reflexes are normal and symmetric.  Psychiatric:        Behavior: Behavior normal.        Thought Content: Thought content normal.        Judgment: Judgment normal.   R base rhonchi  Lab Results  Component Value Date   WBC 9.2 08/05/2021   HGB 13.9 08/05/2021   HCT 41.3 08/05/2021   PLT 215.0 08/05/2021   GLUCOSE 88 08/05/2021   CHOL 165 02/12/2021   TRIG 165.0 (H) 02/12/2021   HDL 56.40 02/12/2021   LDLDIRECT 78.0 07/17/2020   LDLCALC 76 02/12/2021   ALT 32 08/05/2021   AST 25 08/05/2021   NA 138 08/05/2021   K 3.9 08/05/2021   CL 100 08/05/2021   CREATININE 0.85 08/05/2021   BUN 16 08/05/2021   CO2 28 08/05/2021   TSH 1.87 08/05/2021   PSA 0.75 07/17/2020   HGBA1C 5.8 05/11/2019    US Abdomen Complete  Result Date: 05/17/2019 CLINICAL DATA:  Nausea EXAM: ABDOMEN ULTRASOUND COMPLETE COMPARISON:  01/03/2010.  CT 04/27/2016 FINDINGS: Gallbladder: No gallstones or  wall thickening visualized. No sonographic Murphy sign noted by sonographer. Common bile duct: Diameter: Normal caliber, 5 mm Liver: Diffusely increased echotexture compatible with fatty infiltration. 9 mm cyst in the left lobe. No biliary ductal dilatation. Portal vein is patent on color Doppler imaging with normal direction of blood flow towards the liver. IVC: No abnormality visualized. Pancreas: Visualized portion  unremarkable. Spleen: Size and appearance within normal limits. Right Kidney: Length: 12.0 cm. Echogenicity within normal limits. No mass or hydronephrosis visualized. Left Kidney: Length: 11.1 cm. Echogenicity within normal limits. No mass or hydronephrosis visualized. Abdominal aorta: No aneurysm visualized. Other findings: None. IMPRESSION: Diffuse fatty infiltration of the liver. Electronically Signed   By: Charlett Nose M.D.   On: 05/17/2019 11:23    Assessment & Plan:   Problem List Items Addressed This Visit     Dyslipidemia    Cont on Lipitor      Relevant Orders   TSH   Comprehensive metabolic panel   URI, acute - Primary    R/o R CAP CXR Omnicef Hycodan prn      Relevant Medications   cefdinir (OMNICEF) 300 MG capsule   Other Relevant Orders   DG Chest 2 View   TSH   Urinalysis   CBC with Differential/Platelet   Lipid panel   PSA   Comprehensive metabolic panel   Hemoglobin A1c   Hyperglycemia    Check A1c      Relevant Orders   Hemoglobin A1c   Other Visit Diagnoses     Bladder neck obstruction       Relevant Orders   PSA         Meds ordered this encounter  Medications   HYDROcodone bit-homatropine (HYCODAN) 5-1.5 MG/5ML syrup    Sig: Take 5 mLs by mouth every 8 (eight) hours as needed for cough.    Dispense:  240 mL    Refill:  0   cefdinir (OMNICEF) 300 MG capsule    Sig: Take 1 capsule (300 mg total) by mouth 2 (two) times daily.    Dispense:  20 capsule    Refill:  0      Follow-up: Return in about 3 months (around 05/05/2023)  for a follow-up visit.  Sonda Primes, MD

## 2023-02-03 NOTE — Assessment & Plan Note (Signed)
R/o R CAP CXR Omnicef Hycodan prn

## 2023-02-04 ENCOUNTER — Encounter (INDEPENDENT_AMBULATORY_CARE_PROVIDER_SITE_OTHER): Payer: PPO | Admitting: Ophthalmology

## 2023-02-04 DIAGNOSIS — H3553 Other dystrophies primarily involving the sensory retina: Secondary | ICD-10-CM

## 2023-02-04 DIAGNOSIS — H35033 Hypertensive retinopathy, bilateral: Secondary | ICD-10-CM

## 2023-02-04 DIAGNOSIS — H353132 Nonexudative age-related macular degeneration, bilateral, intermediate dry stage: Secondary | ICD-10-CM | POA: Diagnosis not present

## 2023-02-04 DIAGNOSIS — H43813 Vitreous degeneration, bilateral: Secondary | ICD-10-CM | POA: Diagnosis not present

## 2023-02-04 DIAGNOSIS — I10 Essential (primary) hypertension: Secondary | ICD-10-CM | POA: Diagnosis not present

## 2023-02-05 ENCOUNTER — Telehealth: Payer: Self-pay | Admitting: Internal Medicine

## 2023-02-05 NOTE — Telephone Encounter (Signed)
Patient was seen 02/03/23 for a cough and was prescribed HYDROcodone bit-homatropine (HYCODAN) 5-1.5 MG/5ML syrup. He was told it should help his cough subside enough to let him sleep. Patient said it is not helping and he still can't sleep with the cough. He would like to know what Dr. Posey Rea would recommend. Patient would like a call back at [phone].

## 2023-02-06 MED ORDER — METHYLPREDNISOLONE 4 MG PO TBPK: ORAL_TABLET | ORAL | 0 refills | Status: DC

## 2023-02-06 NOTE — Telephone Encounter (Signed)
I will send a prescription for prednisone Dosepak as well.  Please see me next week if not better.

## 2023-02-08 NOTE — Telephone Encounter (Signed)
LVM for pt and was able to inform him that Dr. Posey Rea has sent in an ABX. If this does not work he is to call the clinic and set up an apptmnt to see provider.

## 2023-02-15 ENCOUNTER — Encounter: Payer: Self-pay | Admitting: Internal Medicine

## 2023-02-16 ENCOUNTER — Encounter: Payer: Self-pay | Admitting: Pharmacist

## 2023-02-16 NOTE — Progress Notes (Signed)
Pharmacy Quality Measure Review  This patient is appearing on a report for being at risk of failing the adherence measure for cholesterol (statin) medications this calendar year.   Medication: atorvastatin 40 mg daily Last fill date: 01/29/2023 for 90 day supply  Insurance report was not up to date. No action needed at this time.    Arbutus Leas, PharmD, BCPS [facility] [phone]

## 2023-04-27 ENCOUNTER — Ambulatory Visit: Payer: PPO

## 2023-04-27 VITALS — Ht 73.0 in | Wt 231.0 lb

## 2023-04-27 DIAGNOSIS — Z0000 Encounter for general adult medical examination without abnormal findings: Secondary | ICD-10-CM | POA: Diagnosis not present

## 2023-04-27 NOTE — Patient Instructions (Signed)
Oscar [name] , Thank you for taking time to come for your Medicare Wellness Visit. I appreciate your ongoing commitment to your health goals. Please review the following plan we discussed and let me know if I can assist you in the future.   Referrals/Orders/Follow-Ups/Clinician Recommendations: Keep up the good work.  Have a Merry Christmas.  This is a list of the screening recommended for you and due dates:  Health Maintenance  Topic Date Due   Flu Shot  12/24/2022   COVID-19 Vaccine (8 - 2023-24 season) 01/24/2023   Colon Cancer Screening  10/13/2023   Medicare Annual Wellness Visit  04/26/2024   DTaP/Tdap/Td vaccine (3 - Td or Tdap) 08/13/2026   Pneumonia Vaccine  Completed   HPV Vaccine  Aged Out   Zoster (Shingles) Vaccine  Discontinued    Advanced directives: (Copy Requested) Please bring a copy of your health care power of attorney and living will to the office to be added to your chart at your convenience.  Next Medicare Annual Wellness Visit scheduled for next year: Yes

## 2023-04-27 NOTE — Progress Notes (Addendum)
Subjective:   Oscar [name] is a 82 y.o. male who presents for Medicare Annual/Subsequent preventive examination.  Visit Complete: Virtual I connected with  Oscar [name] on 05/05/23 by a audio enabled telemedicine application and verified that I am speaking with the correct person using two identifiers.  Patient Location: Home  Provider Location: Home Office  I discussed the limitations of evaluation and management by telemedicine. The patient expressed understanding and agreed to proceed.  Vital Signs: Because this visit was a virtual/telehealth visit, some criteria may be missing or patient reported. Any vitals not documented were not able to be obtained and vitals that have been documented are patient reported.   Cardiac Risk Factors include: advanced age (>32men, >9 women);hypertension;male gender;dyslipidemia;Other (see comment), Risk factor comments: BPH     Objective:    Today's Vitals   04/27/23 1509  Weight: 231 lb (104.8 kg)  Height: 6\' 1"  (1.854 m)   Body mass index is 30.48 kg/m.     04/27/2023    3:25 PM 05/04/2022    3:22 PM 05/01/2021   10:50 AM 01/11/2020    3:53 PM 06/08/2018   11:31 AM 04/02/2017    4:22 PM 05/23/2015   10:57 AM  Advanced Directives  Does Patient Have a Medical Advance Directive? Yes Yes Yes No Yes Yes Yes  Type of Estate agent of [place];Living will Healthcare Power of [place];Living will Living will;Healthcare Power of [employer] Power of [place];Living will Healthcare Power of [place];Living will Healthcare Power of [place];Living will  Does patient want to make changes to medical advance directive?   No - Patient declined Yes (MAU/Ambulatory/Procedural Areas - Information given)     Copy of Healthcare Power of Attorney in Chart? No - copy requested No - copy requested No - copy requested  No - copy requested No - copy requested     Current Medications (verified) Outpatient Encounter Medications as  of 04/27/2023  Medication Sig   amLODipine-valsartan (EXFORGE) 10-320 MG tablet Take 1 tablet by mouth daily. Take 1 tablet by mouth daily.   ARIPiprazole (ABILIFY) 10 MG tablet Take 10 mg by mouth daily.   aspirin 325 MG tablet Take 325 mg by mouth daily.   atorvastatin (LIPITOR) 40 MG tablet TAKE 1 TABLET(40 MG) BY MOUTH DAILY   cefdinir (OMNICEF) 300 MG capsule Take 1 capsule (300 mg total) by mouth 2 (two) times daily.   Cholecalciferol (RA VITAMIN D-3) 25 MCG (1000 UT) tablet Take 1 tablet (1,000 Units total) by mouth daily.   clobetasol (TEMOVATE) 0.05 % external solution Apply 1 application topically 2 (two) times daily. Use for scalp lesions   desvenlafaxine (PRISTIQ) 100 MG 24 hr tablet    dutasteride (AVODART) 0.5 MG capsule TK 1 C PO QD   famotidine-calcium carbonate-magnesium hydroxide (PEPCID COMPLETE) 10-800-165 MG chewable tablet Chew 1 tablet by mouth daily as needed.   HYDROcodone bit-homatropine (HYCODAN) 5-1.5 MG/5ML syrup Take 5 mLs by mouth every 8 (eight) hours as needed for cough.   methylPREDNISolone (MEDROL DOSEPAK) 4 MG TBPK tablet As directed   multivitamin-lutein (OCUVITE-LUTEIN) CAPS capsule Take 1 capsule daily by mouth.   omeprazole (PRILOSEC) 20 MG capsule Take 2 capsules (40 mg total) by mouth daily.   sertraline (ZOLOFT) 100 MG tablet Take 100 mg by mouth daily.   tamsulosin (FLOMAX) 0.4 MG CAPS capsule Take 0.4 mg by mouth daily.   tiZANidine (ZANAFLEX) 4 MG tablet Take 1 tablet (4 mg total) by mouth every 8 (eight) hours  as needed for muscle spasms (back pain).   torsemide (DEMADEX) 5 MG tablet Take 5 mg by mouth daily. Take 5 mg by mouth daily.   No facility-administered encounter medications on file as of 04/27/2023.    Allergies (verified) Celecoxib, Penicillins, and Rofecoxib   History: Past Medical History:  Diagnosis Date   Adenomatous colon polyp    Allergic rhinitis, cause unspecified    Allergy    Anxiety    Anxiety state, unspecified     Barrett esophagus    Barrett esophagus    BPH (benign prostatic hypertrophy)    BX 2007   Cancer (HCC)    melanoma   Cataract    bilaterally removed   Depression    Depressive disorder, not elsewhere classified    GERD (gastroesophageal reflux disease)    Hypertension    Hypogonadism male    Microhematuria    Neuromuscular disorder (HCC)    small HH 05-26-2012 egd    Other and unspecified hyperlipidemia    Rosacea    UTI (lower urinary tract infection) 2009   Past Surgical History:  Procedure Laterality Date   CATARACT EXTRACTION  05/10/2012   left   COLONOSCOPY  04-22-2010   ESOPHAGOGASTRODUODENOSCOPY  05-26-2012   [facility]   HAND SURGERY Left    MELANOMA EXCISION     POLYPECTOMY     prev colon TA polyps- no polyps in 2011   PROSTATE BIOPSY  2011   Dr Demetrius Revel    UPPER GASTROINTESTINAL ENDOSCOPY     Family History  Problem Relation Age of Onset   Hypertension Mother    Cancer Mother        colon ca   Colon cancer Mother 64   Heart disease Father        MI   Coronary artery disease Other    Stomach cancer Maternal Grandfather    Colon polyps Neg Hx    Esophageal cancer Neg Hx    Rectal cancer Neg Hx    Social History   Socioeconomic History   Marital status: Married    Spouse name: Oscar [name]   Number of children: 3   Years of education: Not on file   Highest education level: Not on file  Occupational History   Occupation: [employer] - Retired 2010/june  Tobacco Use   Smoking status: Former    Current packs/day: 0.00    Average packs/day: 1.5 packs/day for 20.0 years (30.0 ttl pk-yrs)    Types: Cigarettes    Start date: 05/25/1958    Quit date: 05/25/1978    Years since quitting: 44.9   Smokeless tobacco: Never  Vaping Use   Vaping status: Never Used  Substance and Sexual Activity   Alcohol use: Yes    Alcohol/week: 7.0 standard drinks of alcohol    Types: 7 Standard drinks or equivalent per week    Comment: 2 drinks per day, wine and mixed drinks   Drug use: No    Sexual activity: Yes  Other Topics Concern   Not on file  Social History Narrative   Married - Seperated 2010, reuniting 2012    Lives with wife.      Social Determinants of Health   Financial Resource Strain: Low Risk  (04/27/2023)   Overall Financial Resource Strain (CARDIA)    Difficulty of Paying Living Expenses: Not hard at all  Food Insecurity: No Food Insecurity (04/27/2023)   Hunger Vital Sign    Worried About Running Out of Food in the Last Year:  Never true    Ran Out of Food in the Last Year: Never true  Transportation Needs: No Transportation Needs (04/27/2023)   PRAPARE - Administrator, Civil Service (Medical): No    Lack of Transportation (Non-Medical): No  Physical Activity: Inactive (04/27/2023)   Exercise Vital Sign    Days of Exercise per Week: 0 days    Minutes of Exercise per Session: 0 min  Stress: No Stress Concern Present (04/27/2023)   [employer] of Occupational Health - Occupational Stress Questionnaire    Feeling of Stress : Not at all  Social Connections: Socially Integrated (04/27/2023)   Social Connection and Isolation Panel [NHANES]    Frequency of Communication with Friends and Family: More than three times a week    Frequency of Social Gatherings with Friends and Family: Three times a week    Attends Religious Services: More than 4 times per year    Active Member of Clubs or Organizations: Yes    Attends Banker Meetings: 1 to 4 times per year    Marital Status: Married    Tobacco Counseling Counseling given: Not Answered   Clinical Intake:  Pre-visit preparation completed: Yes  Pain : No/denies pain     BMI - recorded: 30.48 Nutritional Status: BMI > 30  Obese Nutritional Risks: None Diabetes: No  How often do you need to have someone help you when you read instructions, pamphlets, or other written materials from your doctor or pharmacy?: 1 - Never  Interpreter Needed?: No  Information entered by  :: Malak Duchesneau, RMA   Activities of Daily Living    04/27/2023    3:09 PM  In your present state of health, do you have any difficulty performing the following activities:  Hearing? 1  Comment has some hearing loss  Vision? 0  Difficulty concentrating or making decisions? 0  Walking or climbing stairs? 0  Dressing or bathing? 0  Doing errands, shopping? 0  Preparing Food and eating ? N  Using the Toilet? N  In the past six months, have you accidently leaked urine? Y  Do you have problems with loss of bowel control? N  Managing your Medications? N  Managing your Finances? N  Housekeeping or managing your Housekeeping? N    Patient Care Team: Plotnikov, Georgina Quint, MD as PCP - General Milagros Evener, MD (Psychiatry) Jerilee Field, MD as Attending Physician (Urology) Nyoka Cowden, MD as Attending Physician (Pulmonary Disease) Meryl Dare, MD as Attending Physician (Gastroenterology) Fredrich Birks, OD as Referring Physician (Optometry) Sherrie George, MD as Consulting Physician (Ophthalmology) August Luz, MD as Referring Physician (Cardiology)  Indicate any recent Medical Services you may have received from other than [facility] providers in the past year (date may be approximate).     Assessment:   This is a routine wellness examination for Oscar [name].  Hearing/Vision screen Hearing Screening - Comments:: Some hearing loss Vision Screening - Comments:: Wears eyeglasses   Goals Addressed   None   Depression Screen    04/27/2023    3:30 PM 02/03/2023    9:20 AM 08/06/2022    9:34 AM 08/06/2022    9:16 AM 05/04/2022    3:27 PM 08/05/2021   10:10 AM 05/01/2021   10:41 AM  PHQ 2/9 Scores  PHQ - 2 Score 0 0 0 0 0 0 0  PHQ- 9 Score 0  1        Fall Risk    04/27/2023  3:25 PM 02/03/2023    9:20 AM 08/06/2022    9:34 AM 08/06/2022    9:15 AM 05/04/2022    3:23 PM  Fall Risk   Falls in the past year? 0 0 0 0 0  Number falls in past yr: 0 0 0 0 0  Injury with  Fall? 0 0 0 0 0  Risk for fall due to : No Fall Risks No Fall Risks No Fall Risks No Fall Risks No Fall Risks  Follow up Falls prevention discussed;Falls evaluation completed Falls evaluation completed Falls evaluation completed Falls evaluation completed Falls prevention discussed    MEDICARE RISK AT HOME: Medicare Risk at Home Any stairs in or around the home?: Yes If so, are there any without handrails?: Yes Home free of loose throw rugs in walkways, pet beds, electrical cords, etc?: Yes Adequate lighting in your home to reduce risk of falls?: Yes Life alert?: No Use of a cane, walker or w/c?: No Grab bars in the bathroom?: Yes Shower chair or bench in shower?: Yes Elevated toilet seat or a handicapped toilet?: Yes  TIMED UP AND GO:  Was the test performed?  No    Cognitive Function:    06/08/2018   11:41 AM 04/02/2017    4:22 PM  MMSE - Mini Mental State Exam  Orientation to time 5 5  Orientation to Place 5 5  Registration 3 3  Attention/ Calculation 5 5  Recall 3 3  Language- name 2 objects 2 2  Language- repeat 1 1  Language- follow 3 step command 3 3  Language- read & follow direction 1 1  Write a sentence 1 1  Copy design 1 1  Total score 30 30        04/27/2023    3:26 PM 05/04/2022    3:27 PM  6CIT Screen  What Year? 0 points 0 points  What month? 0 points 0 points  What time? 0 points 0 points  Count back from 20 0 points 0 points  Months in reverse 0 points 0 points  Repeat phrase 2 points 0 points  Total Score 2 points 0 points    Immunizations Immunization History  Administered Date(s) Administered   Fluad Quad(high Dose 65+) 02/22/2021, 02/05/2022   Influenza Split 04/09/2011, 03/01/2012   Influenza Whole 03/19/2009   Influenza, High Dose Seasonal PF 02/26/2016, 01/25/2019   Influenza,inj,Quad PF,6+ Mos 03/16/2013, 02/15/2014, 02/20/2015   Influenza-Unspecified 03/15/2017, 02/06/2018, 03/11/2023   PFIZER Comirnaty(Gray Top)Covid-19  Tri-Sucrose Vaccine 02/19/2022   PFIZER(Purple Top)SARS-COV-2 Vaccination 06/14/2019, 07/03/2019, 03/16/2020, 03/05/2021, 11/12/2021   Pfizer(Comirnaty)Fall Seasonal Vaccine 12 years and older 03/11/2023   Pneumococcal Conjugate-13 08/15/2013   Pneumococcal Polysaccharide-23 07/22/2009   Respiratory Syncytial Virus Vaccine,Recomb Aduvanted(Arexvy) 02/19/2022   Td 08/20/2014   Tdap 08/12/2016   Unspecified SARS-COV-2 Vaccination 07/17/2019   Zoster, Live 02/20/2015    TDAP status: Up to date  Flu Vaccine status: Up to date  Pneumococcal vaccine status: Up to date  Covid-19 vaccine status: Completed vaccines  Qualifies for Shingles Vaccine? Yes   Zostavax completed Yes   Shingrix Completed?: No.    Education has been provided regarding the importance of this vaccine. Patient has been advised to call insurance company to determine out of pocket expense if they have not yet received this vaccine. Advised may also receive vaccine at local pharmacy or Health Dept. Verbalized acceptance and understanding.  Screening Tests Health Maintenance  Topic Date Due   COVID-19 Vaccine (9 - 2023-24 season) 05/06/2023  Colonoscopy  10/13/2023   Medicare Annual Wellness (AWV)  04/26/2024   DTaP/Tdap/Td (3 - Td or Tdap) 08/13/2026   Pneumonia Vaccine 70+ Years old  Completed   INFLUENZA VACCINE  Completed   HPV VACCINES  Aged Out   Zoster Vaccines- Shingrix  Discontinued    Health Maintenance  Health Maintenance Due  Topic Date Due   COVID-19 Vaccine (9 - 2023-24 season) 05/06/2023    Colorectal cancer screening: Type of screening: Colonoscopy. Completed 10/13/2018. Repeat every 5 years  Lung Cancer Screening: (Low Dose CT Chest recommended if Age 31-80 years, 20 pack-year currently smoking OR have quit w/in 15years.) does not qualify.   Lung Cancer Screening Referral: NA  Additional Screening:  Hepatitis C Screening: does not qualify;   Vision Screening: Recommended annual  ophthalmology exams for early detection of glaucoma and other disorders of the eye. Is the patient up to date with their annual eye exam?  Yes  Who is the provider or what is the name of the office in which the patient attends annual eye exams? Dr. Ashley Royalty and Dr. Lorin Picket If pt is not established with a provider, would they like to be referred to a provider to establish care? No .   Dental Screening: Recommended annual dental exams for proper oral hygiene   Community Resource Referral / Chronic Care Management: CRR required this visit?  No   CCM required this visit?  No     Plan:     I have personally reviewed and noted the following in the patient's chart:   Medical and social history Use of alcohol, tobacco or illicit drugs  Current medications and supplements including opioid prescriptions. Patient is not currently taking opioid prescriptions. Functional ability and status Nutritional status Physical activity Advanced directives List of other physicians Hospitalizations, surgeries, and ER visits in previous 12 months Vitals Screenings to include cognitive, depression, and falls Referrals and appointments  In addition, I have reviewed and discussed with patient certain preventive protocols, quality metrics, and best practice recommendations. A written personalized care plan for preventive services as well as general preventive health recommendations were provided to patient.     Lemmie Steinhaus L Jaquelynn Wanamaker, CMA   05/05/2023   After Visit Summary: (MyChart) Due to this being a telephonic visit, the after visit summary with patients personalized plan was offered to patient via MyChart   Nurse Notes: Patient is up to date on all health maintenance.  He has no concerns to be addressed today.    Medical screening examination/treatment/procedure(s) were performed by non-physician practitioner and as supervising physician I was immediately available for consultation/collaboration.  I agree  with above. Jacinta Shoe, MD

## 2023-05-05 ENCOUNTER — Ambulatory Visit: Payer: PPO | Admitting: Internal Medicine

## 2023-05-05 ENCOUNTER — Encounter: Payer: Self-pay | Admitting: Internal Medicine

## 2023-05-05 VITALS — BP 132/78 | HR 74 | Temp 98.7°F | Ht 73.0 in | Wt 241.2 lb

## 2023-05-05 DIAGNOSIS — R635 Abnormal weight gain: Secondary | ICD-10-CM | POA: Diagnosis not present

## 2023-05-05 DIAGNOSIS — R0609 Other forms of dyspnea: Secondary | ICD-10-CM | POA: Diagnosis not present

## 2023-05-05 DIAGNOSIS — F4323 Adjustment disorder with mixed anxiety and depressed mood: Secondary | ICD-10-CM

## 2023-05-05 DIAGNOSIS — K2270 Barrett's esophagus without dysplasia: Secondary | ICD-10-CM | POA: Diagnosis not present

## 2023-05-05 DIAGNOSIS — E66811 Obesity, class 1: Secondary | ICD-10-CM | POA: Diagnosis not present

## 2023-05-05 DIAGNOSIS — E785 Hyperlipidemia, unspecified: Secondary | ICD-10-CM | POA: Diagnosis not present

## 2023-05-05 DIAGNOSIS — I4891 Unspecified atrial fibrillation: Secondary | ICD-10-CM | POA: Insufficient documentation

## 2023-05-05 NOTE — Assessment & Plan Note (Addendum)
04/2023 Unknown onset Pt went to [facility] for a health study - EKG w/A fib, labs pending. He is back on ASA 325 mg every day Cardiology ref Dr Mercy Riding Rate controlled

## 2023-05-05 NOTE — Assessment & Plan Note (Signed)
Cont on Lipitor 

## 2023-05-05 NOTE — Assessment & Plan Note (Signed)
Periodic EGD Cont on Prilosec

## 2023-05-05 NOTE — Assessment & Plan Note (Signed)
Gained wt over the holiday

## 2023-05-05 NOTE — Assessment & Plan Note (Signed)
F/u w/Dr [facility] On [place], Zoloft, Abilify

## 2023-05-05 NOTE — Assessment & Plan Note (Signed)
  On diet  

## 2023-05-05 NOTE — Progress Notes (Signed)
Subjective:  Patient ID: Oscar [name], male    DOB: 04-17-1941  Age: 82 y.o. MRN: [id number]  CC: Medical Management of Chronic Issues (3 month follow up. Past upper respiratory infection and history of hyperglycemia. Upper respiratory infection cleared up within a month. /PT does note of mark on Left middle digit that appeared several weeks ago. Notes of no pain, slightly raised, dark in pigmentation) and Atrial Fibrillation (Recent A-fib diagnosis through research group they are working with. Will follow up with cardiologist)   HPI Oscar [name] presents for A fib, HTN, depression' C/o wt gain Pt went to [facility] for a health study - EKG w/A fib, labs pending. He is back on ASA qd  Outpatient Medications Prior to Visit  Medication Sig Dispense Refill   amLODipine-valsartan (EXFORGE) 10-320 MG tablet Take 1 tablet by mouth daily. Take 1 tablet by mouth daily.     ARIPiprazole (ABILIFY) 10 MG tablet Take 10 mg by mouth daily.     aspirin 325 MG tablet Take 325 mg by mouth daily.     atorvastatin (LIPITOR) 40 MG tablet TAKE 1 TABLET(40 MG) BY MOUTH DAILY 90 tablet 3   cefdinir (OMNICEF) 300 MG capsule Take 1 capsule (300 mg total) by mouth 2 (two) times daily. 20 capsule 0   Cholecalciferol (RA VITAMIN D-3) 25 MCG (1000 UT) tablet Take 1 tablet (1,000 Units total) by mouth daily. 90 tablet 3   clobetasol (TEMOVATE) 0.05 % external solution Apply 1 application topically 2 (two) times daily. Use for scalp lesions 50 mL 3   desvenlafaxine (PRISTIQ) 100 MG 24 hr tablet      dutasteride (AVODART) 0.5 MG capsule TK 1 C PO QD     famotidine-calcium carbonate-magnesium hydroxide (PEPCID COMPLETE) 10-800-165 MG chewable tablet Chew 1 tablet by mouth daily as needed.     HYDROcodone bit-homatropine (HYCODAN) 5-1.5 MG/5ML syrup Take 5 mLs by mouth every 8 (eight) hours as needed for cough. 240 mL 0   methylPREDNISolone (MEDROL DOSEPAK) 4 MG TBPK tablet As directed 21 tablet 0   multivitamin-lutein  (OCUVITE-LUTEIN) CAPS capsule Take 1 capsule daily by mouth.     omeprazole (PRILOSEC) 20 MG capsule Take 2 capsules (40 mg total) by mouth daily. 30 capsule 11   sertraline (ZOLOFT) 100 MG tablet Take 100 mg by mouth daily.     tamsulosin (FLOMAX) 0.4 MG CAPS capsule Take 0.4 mg by mouth daily.     tiZANidine (ZANAFLEX) 4 MG tablet Take 1 tablet (4 mg total) by mouth every 8 (eight) hours as needed for muscle spasms (back pain). 60 tablet 1   torsemide (DEMADEX) 5 MG tablet Take 5 mg by mouth daily. Take 5 mg by mouth daily.     No facility-administered medications prior to visit.    ROS: Review of Systems  Constitutional:  Positive for fatigue and unexpected weight change. Negative for appetite change.  HENT:  Negative for congestion, nosebleeds, sneezing, sore throat and trouble swallowing.   Eyes:  Negative for itching and visual disturbance.  Respiratory:  Positive for shortness of breath. Negative for cough.   Cardiovascular:  Negative for chest pain, palpitations and leg swelling.  Gastrointestinal:  Negative for abdominal distention, blood in stool, diarrhea and nausea.  Genitourinary:  Negative for frequency and hematuria.  Musculoskeletal:  Negative for back pain, gait problem, joint swelling and neck pain.  Skin:  Negative for rash.  Neurological:  Negative for dizziness, tremors, speech difficulty and weakness.  Psychiatric/Behavioral:  Negative for agitation,  dysphoric mood and sleep disturbance. The patient is not nervous/anxious.     Objective:  BP 132/78   Pulse 74   Temp 98.7 F (37.1 C) (Oral)   Ht 6\' 1"  (1.854 m)   Wt 241 lb 3.2 oz (109.4 kg)   SpO2 97%   BMI 31.82 kg/m   BP Readings from Last 3 Encounters:  05/05/23 132/78  02/03/23 110/70  08/06/22 120/74    Wt Readings from Last 3 Encounters:  05/05/23 241 lb 3.2 oz (109.4 kg)  04/27/23 231 lb (104.8 kg)  02/03/23 228 lb 6.4 oz (103.6 kg)    Physical Exam Constitutional:      General: He is not  in acute distress.    Appearance: He is well-developed. He is obese.     Comments: NAD  Eyes:     Conjunctiva/sclera: Conjunctivae normal.     Pupils: Pupils are equal, round, and reactive to light.  Neck:     Thyroid: No thyromegaly.     Vascular: No JVD.  Cardiovascular:     Rate and Rhythm: Normal rate. Rhythm irregular.     Heart sounds: Normal heart sounds. No murmur heard.    No friction rub. No gallop.  Pulmonary:     Effort: Pulmonary effort is normal. No respiratory distress.     Breath sounds: Normal breath sounds. No wheezing or rales.  Chest:     Chest wall: No tenderness.  Abdominal:     General: Bowel sounds are normal. There is no distension.     Palpations: Abdomen is soft. There is no mass.     Tenderness: There is no abdominal tenderness. There is no guarding or rebound.  Musculoskeletal:        General: No tenderness. Normal range of motion.     Cervical back: Normal range of motion.  Lymphadenopathy:     Cervical: No cervical adenopathy.  Skin:    General: Skin is warm and dry.     Findings: No rash.  Neurological:     Mental Status: He is alert and oriented to person, place, and time.     Cranial Nerves: No cranial nerve deficit.     Motor: No abnormal muscle tone.     Coordination: Coordination normal.     Gait: Gait normal.     Deep Tendon Reflexes: Reflexes are normal and symmetric.  Psychiatric:        Behavior: Behavior normal.        Thought Content: Thought content normal.        Judgment: Judgment normal.     Lab Results  Component Value Date   WBC 9.2 08/05/2021   HGB 13.9 08/05/2021   HCT 41.3 08/05/2021   PLT 215.0 08/05/2021   GLUCOSE 88 08/05/2021   CHOL 165 02/12/2021   TRIG 165.0 (H) 02/12/2021   HDL 56.40 02/12/2021   LDLDIRECT 78.0 07/17/2020   LDLCALC 76 02/12/2021   ALT 32 08/05/2021   AST 25 08/05/2021   NA 138 08/05/2021   K 3.9 08/05/2021   CL 100 08/05/2021   CREATININE 0.85 08/05/2021   BUN 16 08/05/2021   CO2  28 08/05/2021   TSH 1.87 08/05/2021   PSA 0.75 07/17/2020   HGBA1C 5.8 05/11/2019    US Abdomen Complete  Result Date: 05/17/2019 CLINICAL DATA:  Nausea EXAM: ABDOMEN ULTRASOUND COMPLETE COMPARISON:  01/03/2010.  CT 04/27/2016 FINDINGS: Gallbladder: No gallstones or wall thickening visualized. No sonographic Murphy sign noted by sonographer. Common bile duct: Diameter:  Normal caliber, 5 mm Liver: Diffusely increased echotexture compatible with fatty infiltration. 9 mm cyst in the left lobe. No biliary ductal dilatation. Portal vein is patent on color Doppler imaging with normal direction of blood flow towards the liver. IVC: No abnormality visualized. Pancreas: Visualized portion unremarkable. Spleen: Size and appearance within normal limits. Right Kidney: Length: 12.0 cm. Echogenicity within normal limits. No mass or hydronephrosis visualized. Left Kidney: Length: 11.1 cm. Echogenicity within normal limits. No mass or hydronephrosis visualized. Abdominal aorta: No aneurysm visualized. Other findings: None. IMPRESSION: Diffuse fatty infiltration of the liver. Electronically Signed   By: Charlett Nose M.D.   On: 05/17/2019 11:23    Assessment & Plan:   Problem List Items Addressed This Visit     Dyslipidemia - Primary    Cont on Lipitor      Adjustment disorder with mixed anxiety and depressed mood    F/u w/Dr Evelene Croon On [place], Zoloft, Abilify      Barrett's esophagus    Periodic EGD Cont on Prilosec      DOE (dyspnea on exertion)    Gained wt over the holiday: worse      Weight gain    On diet      Obesity (BMI 30.0-34.9)    Gained wt over the holiday      Atrial fibrillation (HCC)    04/2023 Unknown onset Pt went to [facility] for a health study - EKG w/A fib, labs pending. He is back on ASA 325 mg every day Cardiology ref Dr Mercy Riding Rate controlled      Relevant Orders   Ambulatory referral to Cardiology      No orders of the defined types were placed in this  encounter.     Follow-up: Return in about 6 months (around 11/03/2023) for Wellness Exam.  Sonda Primes, MD

## 2023-05-05 NOTE — Assessment & Plan Note (Signed)
Gained wt over the holiday: worse

## 2023-05-13 DIAGNOSIS — R350 Frequency of micturition: Secondary | ICD-10-CM | POA: Diagnosis not present

## 2023-05-13 DIAGNOSIS — N401 Enlarged prostate with lower urinary tract symptoms: Secondary | ICD-10-CM | POA: Diagnosis not present

## 2023-06-30 DIAGNOSIS — M51369 Other intervertebral disc degeneration, lumbar region without mention of lumbar back pain or lower extremity pain: Secondary | ICD-10-CM | POA: Diagnosis not present

## 2023-06-30 DIAGNOSIS — M5451 Vertebrogenic low back pain: Secondary | ICD-10-CM | POA: Diagnosis not present

## 2023-07-16 DIAGNOSIS — M5451 Vertebrogenic low back pain: Secondary | ICD-10-CM | POA: Diagnosis not present

## 2023-07-22 DIAGNOSIS — M5451 Vertebrogenic low back pain: Secondary | ICD-10-CM | POA: Diagnosis not present

## 2023-07-27 DIAGNOSIS — M51369 Other intervertebral disc degeneration, lumbar region without mention of lumbar back pain or lower extremity pain: Secondary | ICD-10-CM | POA: Diagnosis not present

## 2023-07-27 DIAGNOSIS — M5459 Other low back pain: Secondary | ICD-10-CM | POA: Diagnosis not present

## 2023-07-28 DIAGNOSIS — M5451 Vertebrogenic low back pain: Secondary | ICD-10-CM | POA: Diagnosis not present

## 2023-08-03 DIAGNOSIS — I340 Nonrheumatic mitral (valve) insufficiency: Secondary | ICD-10-CM | POA: Diagnosis not present

## 2023-08-05 DIAGNOSIS — M5451 Vertebrogenic low back pain: Secondary | ICD-10-CM | POA: Diagnosis not present

## 2023-08-13 DIAGNOSIS — I517 Cardiomegaly: Secondary | ICD-10-CM | POA: Diagnosis not present

## 2023-08-13 DIAGNOSIS — I4891 Unspecified atrial fibrillation: Secondary | ICD-10-CM | POA: Diagnosis not present

## 2023-08-13 DIAGNOSIS — R0602 Shortness of breath: Secondary | ICD-10-CM | POA: Diagnosis not present

## 2023-08-13 DIAGNOSIS — I4719 Other supraventricular tachycardia: Secondary | ICD-10-CM | POA: Diagnosis not present

## 2023-08-13 DIAGNOSIS — I10 Essential (primary) hypertension: Secondary | ICD-10-CM | POA: Diagnosis not present

## 2023-08-17 DIAGNOSIS — M5451 Vertebrogenic low back pain: Secondary | ICD-10-CM | POA: Diagnosis not present

## 2023-08-20 DIAGNOSIS — M5416 Radiculopathy, lumbar region: Secondary | ICD-10-CM | POA: Insufficient documentation

## 2023-08-20 DIAGNOSIS — M47896 Other spondylosis, lumbar region: Secondary | ICD-10-CM | POA: Diagnosis not present

## 2023-08-20 DIAGNOSIS — M47816 Spondylosis without myelopathy or radiculopathy, lumbar region: Secondary | ICD-10-CM | POA: Insufficient documentation

## 2023-08-24 DIAGNOSIS — L821 Other seborrheic keratosis: Secondary | ICD-10-CM | POA: Diagnosis not present

## 2023-08-24 DIAGNOSIS — Z08 Encounter for follow-up examination after completed treatment for malignant neoplasm: Secondary | ICD-10-CM | POA: Diagnosis not present

## 2023-08-24 DIAGNOSIS — L814 Other melanin hyperpigmentation: Secondary | ICD-10-CM | POA: Diagnosis not present

## 2023-08-24 DIAGNOSIS — D225 Melanocytic nevi of trunk: Secondary | ICD-10-CM | POA: Diagnosis not present

## 2023-08-24 DIAGNOSIS — Z85828 Personal history of other malignant neoplasm of skin: Secondary | ICD-10-CM | POA: Diagnosis not present

## 2023-08-27 DIAGNOSIS — M51369 Other intervertebral disc degeneration, lumbar region without mention of lumbar back pain or lower extremity pain: Secondary | ICD-10-CM | POA: Diagnosis not present

## 2023-08-27 DIAGNOSIS — M5451 Vertebrogenic low back pain: Secondary | ICD-10-CM | POA: Diagnosis not present

## 2023-09-02 DIAGNOSIS — M5416 Radiculopathy, lumbar region: Secondary | ICD-10-CM | POA: Diagnosis not present

## 2023-09-06 DIAGNOSIS — R001 Bradycardia, unspecified: Secondary | ICD-10-CM | POA: Insufficient documentation

## 2023-09-07 ENCOUNTER — Ambulatory Visit: Attending: Cardiology | Admitting: Cardiology

## 2023-09-07 ENCOUNTER — Encounter: Payer: Self-pay | Admitting: Cardiology

## 2023-09-07 VITALS — BP 104/58 | HR 78 | Ht 73.0 in | Wt 234.0 lb

## 2023-09-07 DIAGNOSIS — I480 Paroxysmal atrial fibrillation: Secondary | ICD-10-CM

## 2023-09-07 DIAGNOSIS — Z01812 Encounter for preprocedural laboratory examination: Secondary | ICD-10-CM | POA: Diagnosis not present

## 2023-09-07 DIAGNOSIS — I4819 Other persistent atrial fibrillation: Secondary | ICD-10-CM | POA: Diagnosis not present

## 2023-09-07 DIAGNOSIS — D6869 Other thrombophilia: Secondary | ICD-10-CM | POA: Diagnosis not present

## 2023-09-07 DIAGNOSIS — I10 Essential (primary) hypertension: Secondary | ICD-10-CM | POA: Diagnosis not present

## 2023-09-07 MED ORDER — APIXABAN 5 MG PO TABS: 5.0000 mg | ORAL_TABLET | Freq: Two times a day (BID) | ORAL | 1 refills | Status: DC

## 2023-09-07 NOTE — Patient Instructions (Addendum)
 Medication Instructions:  Your physician has recommended you make the following change in your medication:   ** Stop Aspirin  ** Begin Eliquis 5mg  - 1 tablet by mouth twice daily  *If you need a refill on your cardiac medications before your next appointment, please call your pharmacy*  Lab Work: CBC and BMET - please have pre-procedure lab work completed on Thursday November 11 2023. This can be done at ANY [facility] near you - no appointment required and this does not have to be fasting. If you have labs (blood work) drawn today and your tests are completely normal, you will receive your results only by: MyChart Message (if you have MyChart) OR A paper copy in the mail If you have any lab test that is abnormal or we need to change your treatment, we will call you to review the results.  Testing/Procedures: Cardiac CT - someone will contact you to schedule this  Your physician has requested that you have cardiac CT. Cardiac computed tomography (CT) is a painless test that uses an x-ray machine to take clear, detailed pictures of your heart. For further information please visit [email or web address] Please follow instruction sheet as given.   Atrial Fibrillation Ablation - scheduled on Thursday December 02 2023. We will be in contact closer to your ablation date with further instructions Your physician has recommended that you have an ablation. Catheter ablation is a medical procedure used to treat some cardiac arrhythmias (irregular heartbeats). During catheter ablation, a long, thin, flexible tube is put into a blood vessel in your groin (upper thigh), or neck. This tube is called an ablation catheter. It is then guided to your heart through the blood vessel. Radio frequency waves destroy small areas of heart tissue where abnormal heartbeats may cause an arrhythmia to start. Please see the instruction sheet given to you today.  Follow-Up: At [facility], you and your health needs are our  priority.  As part of our continuing mission to provide you with exceptional heart care, our providers are all part of one team.  This team includes your primary Cardiologist (physician) and Advanced Practice Providers or APPs (Physician Assistants and Nurse Practitioners) who all work together to provide you with the care you need, when you need it.  Your next appointment:   We will schedule follow up after your ablation  Provider:   Dr Elberta Fortis   Cardiac Ablation Cardiac ablation is a procedure to destroy, or ablate, a small amount of heart tissue that is causing problems. The heart has many electrical connections. Sometimes, these connections are abnormal and can cause the heart to beat very fast or irregularly. Ablating the abnormal areas can improve the heart's rhythm or return it to normal. Ablation may be done for people who: Have irregular or rapid heartbeats (arrhythmias). Have Wolff-Parkinson-White syndrome. Have taken medicines for an arrhythmia that did not work or caused side effects. Have a high-risk heartbeat that may be life-threatening. Tell a health care provider about: Any allergies you have. All medicines you are taking, including vitamins, herbs, eye drops, creams, and over-the-counter medicines. Any problems you or family members have had with anesthesia. Any bleeding problems you have. Any surgeries you have had. Any medical conditions you have. Whether you are pregnant or may be pregnant. What are the risks? Your health care provider will talk with you about risks. These may include: Infection. Bruising and bleeding. Stroke or blood clots. Damage to nearby structures or organs. Allergic reaction to medicines or dyes. Needing  a pacemaker if the heart gets damaged. A pacemaker is a device that helps the heart beat normally. Failure of the procedure. A repeat procedure may be needed. What happens before the procedure? Medicines Ask your health care provider  about: Changing or stopping your regular medicines. These include any heart rhythm medicines, diabetes medicines, or blood thinners you take. Taking medicines such as aspirin and ibuprofen. These medicines can thin your blood. Do not take them unless your health care provider tells you to. Taking over-the-counter medicines, vitamins, herbs, and supplements. General instructions Follow instructions from your health care provider about what you may eat and drink. If you will be going home right after the procedure, plan to have a responsible adult: Take you home from the hospital or clinic. You will not be allowed to drive. Care for you for the time you are told. Ask your health care provider what steps will be taken to prevent infection. What happens during the procedure?  An IV will be inserted into one of your veins. You may be given: A sedative. This helps you relax. Anesthesia. This will: Numb certain areas of your body. An incision will be made in your neck or your groin. A needle will be inserted through the incision and into a large vein in your neck or groin. The small, thin tube (catheter) will be inserted through the needle and moved to your heart. A type of X-ray (fluoroscopy) will be used to help guide the catheter and provide images of the heart on a monitor. Dye may be injected through the catheter to help your surgeon see the area of the heart that needs treatment. Electrical currents will be sent from the catheter to destroy heart tissue in certain areas. There are three types of energy that may be used to do this: Heat (radiofrequency energy). Laser energy. Extreme cold (cryoablation). When the tissue has been destroyed, the catheter will be removed. Pressure will be held on the insertion area to prevent bleeding. A bandage (dressing) will be placed over the insertion area. The procedure may vary among health care providers and hospitals. What happens after the  procedure? Your blood pressure, heart rate and rhythm, breathing rate, and blood oxygen level will be monitored until you leave the hospital or clinic. Your insertion area will be checked for bleeding. You will need to lie still for a few hours. If your groin was used, you will need to keep your leg straight for a few hours after the catheter is removed. This information is not intended to replace advice given to you by your health care provider. Make sure you discuss any questions you have with your health care provider. Document Revised: 10/28/2021 Document Reviewed: 10/28/2021 Elsevier Patient Education  2024 [employer].

## 2023-09-07 NOTE — Addendum Note (Signed)
 Addended by: Arva Lathe on: 09/07/2023 06:29 PM   Modules accepted: Orders

## 2023-09-07 NOTE — Progress Notes (Signed)
 Electrophysiology Office Note:   Date:  09/07/2023  ID:  Oscar [name], DOB 11-12-1940, MRN [id number]  Primary Cardiologist: Oscar Pacer, MD Primary Heart Failure: None Electrophysiologist: Oscar Goncalves Cort[place] Ding, MD      History of Present Illness:   Oscar [name] is a 83 y.o. male with h/o hypertension, hyperlipidemia, atrial fibrillation seen today for  for Electrophysiology evaluation of atrial fibrillation at the request of Oscar [name].    Today, denies symptoms of palpitations, chest pain, orthopnea, PND, lower extremity edema, claudication, dizziness, presyncope, syncope, bleeding, or neurologic sequela. The patient is tolerating medications without difficulties.  He has been having episodes of dyspnea for approximately the last year.  He states that he has been short of breath intermittently for a few years, but this has gotten worse.  He continues to be [name] to do his daily activities, but has to do them more slowly.  He does not have fatigue or weakness.  He does not have palpitations.  At this point, he would prefer rhythm control for his atrial fibrillation.  He would like to avoid long-term antiarrhythmics.  Review of systems complete and found to be negative unless listed in HPI.   EP Information / Studies Reviewed:    EKG is ordered today. Personal review as below.  EKG Interpretation Date/Time:  Tuesday September 07 2023 16:07:25 EDT Ventricular Rate:  78 PR Interval:    QRS Duration:  122 QT Interval:  390 QTC Calculation: 444 R Axis:   -50  Text Interpretation: Atrial fibrillation with premature ventricular or aberrantly conducted complexes Left anterior fascicular block Minimal voltage criteria for LVH, may be normal variant ( Cornell product ) Septal infarct (cited on or before 14-Jun-2017) When compared with ECG of 14-Jun-2017 11:58, Atrial fibrillation has replaced Sinus rhythm Confirmed by Trampus Mcquerry ([id number]) on 09/07/2023 4:15:07 PM     Risk  Assessment/Calculations:    CHA2DS2-VASc Score = 3   This indicates a 3.2% annual risk of stroke. The patient's score is based upon: CHF History: 0 HTN History: 1 Diabetes History: 0 Stroke History: 0 Vascular Disease History: 0 Age Score: 2 Gender Score: 0             Physical Exam:   VS:  BP (!) 104/58 (BP Location: Left Arm, Patient Position: Sitting, Cuff Size: Large)   Pulse 78   Ht 6\' 1"  (1.854 m)   Wt 234 lb (106.1 kg)   SpO2 94%   BMI 30.87 kg/m    Wt Readings from Last 3 Encounters:  09/07/23 234 lb (106.1 kg)  05/05/23 241 lb 3.2 oz (109.4 kg)  04/27/23 231 lb (104.8 kg)     GEN: Well nourished, well developed in no acute distress NECK: No JVD; No carotid bruits CARDIAC: Irregularly irregular rate and rhythm, no murmurs, rubs, gallops RESPIRATORY:  Clear to auscultation without rales, wheezing or rhonchi  ABDOMEN: Soft, non-tender, non-distended EXTREMITIES:  No edema; No deformity   ASSESSMENT AND PLAN:    1.  Persistent atrial fibrillation: Patient has significant symptoms of shortness of breath.  He is not overly fatigued.  Due to his shortness of breath, he would prefer a rhythm control strategy.  He has been in atrial fibrillation for apparently quite some time, but again would prefer rhythm control.  He understands that his success rate is mildly reduced.  He would like to avoid long-term antiarrhythmic.  Due to that, we Rhiannon Sassaman plan for ablation.  Risk, benefits, and alternatives to EP study and radiofrequency/pulse  field ablation for afib were also discussed in detail today. These risks include but are not limited to stroke, bleeding, vascular damage, tamponade, perforation, damage to the esophagus, lungs, and other structures, pulmonary vein stenosis, worsening renal function, and death. The patient understands these risk and wishes to proceed.  We Adelie Croswell therefore proceed with catheter ablation at the next available time.  Carto, ICE, anesthesia are requested  for the procedure.  Romell Wolden also obtain CT PV protocol prior to the procedure to exclude LAA thrombus and further evaluate atrial anatomy.  2.  Secondary hypercoagulable state: Plan to start Eliquis for atrial fibrillation.  Zaydan Papesh stop aspirin.  3.  Hypertension: Well-controlled  Follow up with Afib Clinic as usual post procedure  Signed, Canden Cieslinski Cortland Ding, MD

## 2023-09-09 DIAGNOSIS — M51369 Other intervertebral disc degeneration, lumbar region without mention of lumbar back pain or lower extremity pain: Secondary | ICD-10-CM | POA: Diagnosis not present

## 2023-09-09 DIAGNOSIS — M5416 Radiculopathy, lumbar region: Secondary | ICD-10-CM | POA: Diagnosis not present

## 2023-09-30 DIAGNOSIS — M5416 Radiculopathy, lumbar region: Secondary | ICD-10-CM | POA: Diagnosis not present

## 2023-10-01 DIAGNOSIS — I517 Cardiomegaly: Secondary | ICD-10-CM | POA: Diagnosis not present

## 2023-10-01 DIAGNOSIS — I10 Essential (primary) hypertension: Secondary | ICD-10-CM | POA: Diagnosis not present

## 2023-10-01 DIAGNOSIS — I340 Nonrheumatic mitral (valve) insufficiency: Secondary | ICD-10-CM | POA: Diagnosis not present

## 2023-10-01 DIAGNOSIS — K2270 Barrett's esophagus without dysplasia: Secondary | ICD-10-CM | POA: Diagnosis not present

## 2023-10-01 DIAGNOSIS — I4891 Unspecified atrial fibrillation: Secondary | ICD-10-CM | POA: Diagnosis not present

## 2023-10-01 DIAGNOSIS — I4719 Other supraventricular tachycardia: Secondary | ICD-10-CM | POA: Diagnosis not present

## 2023-10-01 DIAGNOSIS — R0602 Shortness of breath: Secondary | ICD-10-CM | POA: Diagnosis not present

## 2023-10-21 ENCOUNTER — Encounter: Payer: Self-pay | Admitting: *Deleted

## 2023-10-26 DIAGNOSIS — M5416 Radiculopathy, lumbar region: Secondary | ICD-10-CM | POA: Diagnosis not present

## 2023-10-26 DIAGNOSIS — M51369 Other intervertebral disc degeneration, lumbar region without mention of lumbar back pain or lower extremity pain: Secondary | ICD-10-CM | POA: Diagnosis not present

## 2023-10-26 DIAGNOSIS — M545 Low back pain, unspecified: Secondary | ICD-10-CM | POA: Diagnosis not present

## 2023-11-03 ENCOUNTER — Encounter: Payer: Self-pay | Admitting: Internal Medicine

## 2023-11-03 ENCOUNTER — Ambulatory Visit: Payer: PPO | Admitting: Internal Medicine

## 2023-11-03 VITALS — BP 134/70 | HR 59 | Temp 97.9°F | Ht 73.0 in | Wt 235.0 lb

## 2023-11-03 DIAGNOSIS — K2270 Barrett's esophagus without dysplasia: Secondary | ICD-10-CM

## 2023-11-03 DIAGNOSIS — M5440 Lumbago with sciatica, unspecified side: Secondary | ICD-10-CM

## 2023-11-03 DIAGNOSIS — Z860100 Personal history of colon polyps, unspecified: Secondary | ICD-10-CM | POA: Diagnosis not present

## 2023-11-03 DIAGNOSIS — I4891 Unspecified atrial fibrillation: Secondary | ICD-10-CM

## 2023-11-03 DIAGNOSIS — G8929 Other chronic pain: Secondary | ICD-10-CM

## 2023-11-03 DIAGNOSIS — N401 Enlarged prostate with lower urinary tract symptoms: Secondary | ICD-10-CM | POA: Diagnosis not present

## 2023-11-03 DIAGNOSIS — R5383 Other fatigue: Secondary | ICD-10-CM

## 2023-11-03 NOTE — Progress Notes (Signed)
 Subjective:  Patient ID: Oscar [name], male    DOB: 03/28/1941  Age: 83 y.o. MRN: [id number]  CC: Medical Management of Chronic Issues ( f/u update on L5 pinch nerve and Afib  )   HPI Oscar [name] presents for HTN, prostate cancer, A fib  Outpatient Medications Prior to Visit  Medication Sig Dispense Refill   amLODipine-valsartan (EXFORGE) 10-320 MG tablet Take 1 tablet by mouth daily. Take 1 tablet by mouth daily.     apixaban  (ELIQUIS ) 5 MG TABS tablet Take 1 tablet (5 mg total) by mouth 2 (two) times daily. 180 tablet 1   ARIPiprazole (ABILIFY) 10 MG tablet Take 10 mg by mouth daily.     atorvastatin  (LIPITOR) 40 MG tablet TAKE 1 TABLET(40 MG) BY MOUTH DAILY 90 tablet 3   Cholecalciferol  (RA VITAMIN D -3) 25 MCG (1000 UT) tablet Take 1 tablet (1,000 Units total) by mouth daily. 90 tablet 3   clobetasol  (TEMOVATE ) 0.05 % external solution Apply 1 application topically 2 (two) times daily. Use for scalp lesions 50 mL 3   desvenlafaxine  (PRISTIQ ) 100 MG 24 hr tablet      dutasteride (AVODART) 0.5 MG capsule TK 1 C PO QD     famotidine-calcium  carbonate-magnesium hydroxide (PEPCID COMPLETE) 10-800-165 MG chewable tablet Chew 1 tablet by mouth daily as needed.     finasteride (PROSCAR) 5 MG tablet Take 1 tablet by mouth daily.     GEMTESA 75 MG TABS Take 1 tablet by mouth daily.     HYDROcodone  bit-homatropine (HYCODAN) 5-1.5 MG/5ML syrup Take 5 mLs by mouth every 8 (eight) hours as needed for cough. 240 mL 0   multivitamin-lutein (OCUVITE-LUTEIN) CAPS capsule Take 1 capsule daily by mouth.     omeprazole  (PRILOSEC) 20 MG capsule Take 2 capsules (40 mg total) by mouth daily. 30 capsule 11   sertraline (ZOLOFT) 100 MG tablet Take 100 mg by mouth daily.     tamsulosin (FLOMAX) 0.4 MG CAPS capsule Take 0.4 mg by mouth daily.     tiZANidine  (ZANAFLEX ) 4 MG tablet Take 1 tablet (4 mg total) by mouth every 8 (eight) hours as needed for muscle spasms (back pain). 60 tablet 1   torsemide  (DEMADEX) 5 MG tablet Take 5 mg by mouth daily. Take 5 mg by mouth daily.     cefdinir  (OMNICEF ) 300 MG capsule Take 1 capsule (300 mg total) by mouth 2 (two) times daily. (Patient not taking: Reported on 11/03/2023) 20 capsule 0   methylPREDNISolone  (MEDROL  DOSEPAK) 4 MG TBPK tablet As directed (Patient not taking: Reported on 11/03/2023) 21 tablet 0   No facility-administered medications prior to visit.    ROS: Review of Systems  Constitutional:  Positive for fatigue. Negative for appetite change and unexpected weight change.  HENT:  Negative for congestion, nosebleeds, sneezing, sore throat and trouble swallowing.   Eyes:  Negative for itching and visual disturbance.  Respiratory:  Positive for shortness of breath. Negative for cough.   Cardiovascular:  Positive for palpitations. Negative for chest pain and leg swelling.  Gastrointestinal:  Negative for abdominal distention, blood in stool, diarrhea and nausea.  Genitourinary:  Negative for frequency and hematuria.  Musculoskeletal:  Positive for arthralgias, back pain and gait problem. Negative for joint swelling and neck pain.  Skin:  Negative for rash.  Neurological:  Negative for dizziness, tremors, speech difficulty and weakness.  Psychiatric/Behavioral:  Negative for agitation, dysphoric mood and sleep disturbance. The patient is not nervous/anxious.     Objective:  BP 134/70   Pulse (!) 59   Temp 97.9 F (36.6 C) (Oral)   Ht 6' 1 (1.854 m)   Wt 235 lb (106.6 kg)   SpO2 97%   BMI 31.00 kg/m   BP Readings from Last 3 Encounters:  11/03/23 134/70  09/07/23 (!) 104/58  05/05/23 132/78    Wt Readings from Last 3 Encounters:  11/03/23 235 lb (106.6 kg)  09/07/23 234 lb (106.1 kg)  05/05/23 241 lb 3.2 oz (109.4 kg)    Physical Exam Constitutional:      General: He is not in acute distress.    Appearance: He is well-developed. He is obese.     Comments: NAD  Eyes:     Conjunctiva/sclera: Conjunctivae normal.      Pupils: Pupils are equal, round, and reactive to light.  Neck:     Thyroid : No thyromegaly.     Vascular: No JVD.  Cardiovascular:     Rate and Rhythm: Normal rate. Rhythm irregular.     Heart sounds: Normal heart sounds. No murmur heard.    No friction rub. No gallop.  Pulmonary:     Effort: Pulmonary effort is normal. No respiratory distress.     Breath sounds: Normal breath sounds. No wheezing or rales.  Chest:     Chest wall: No tenderness.  Abdominal:     General: Bowel sounds are normal. There is no distension.     Palpations: Abdomen is soft. There is no mass.     Tenderness: There is no abdominal tenderness. There is no guarding or rebound.  Musculoskeletal:        General: No tenderness. Normal range of motion.     Cervical back: Normal range of motion.     Right lower leg: No edema.     Left lower leg: No edema.  Lymphadenopathy:     Cervical: No cervical adenopathy.  Skin:    General: Skin is warm and dry.     Findings: No rash.  Neurological:     Mental Status: He is alert and oriented to person, place, and time. Mental status is at baseline.     Cranial Nerves: No cranial nerve deficit.     Motor: No abnormal muscle tone.     Coordination: Coordination normal.     Gait: Gait normal.     Deep Tendon Reflexes: Reflexes are normal and symmetric.  Psychiatric:        Behavior: Behavior normal.        Thought Content: Thought content normal.        Judgment: Judgment normal.   LS spine w/pain  Lab Results  Component Value Date   WBC 9.2 08/05/2021   HGB 13.9 08/05/2021   HCT 41.3 08/05/2021   PLT 215.0 08/05/2021   GLUCOSE 88 08/05/2021   CHOL 165 02/12/2021   TRIG 165.0 (H) 02/12/2021   HDL 56.40 02/12/2021   LDLDIRECT 78.0 07/17/2020   LDLCALC 76 02/12/2021   ALT 32 08/05/2021   AST 25 08/05/2021   NA 138 08/05/2021   K 3.9 08/05/2021   CL 100 08/05/2021   CREATININE 0.85 08/05/2021   BUN 16 08/05/2021   CO2 28 08/05/2021   TSH 1.87 08/05/2021    PSA 0.75 07/17/2020   HGBA1C 5.8 05/11/2019    US  Abdomen Complete Result Date: 05/17/2019 CLINICAL DATA:  Nausea EXAM: ABDOMEN ULTRASOUND COMPLETE COMPARISON:  01/03/2010.  CT 04/27/2016 FINDINGS: Gallbladder: No gallstones or wall thickening visualized. No sonographic Murphy sign noted  by sonographer. Common bile duct: Diameter: Normal caliber, 5 mm Liver: Diffusely increased echotexture compatible with fatty infiltration. 9 mm cyst in the left lobe. No biliary ductal dilatation. Portal vein is patent on color Doppler imaging with normal direction of blood flow towards the liver. IVC: No abnormality visualized. Pancreas: Visualized portion unremarkable. Spleen: Size and appearance within normal limits. Right Kidney: Length: 12.0 cm. Echogenicity within normal limits. No mass or hydronephrosis visualized. Left Kidney: Length: 11.1 cm. Echogenicity within normal limits. No mass or hydronephrosis visualized. Abdominal aorta: No aneurysm visualized. Other findings: None. IMPRESSION: Diffuse fatty infiltration of the liver. Electronically Signed   By: Janeece Mechanic M.D.   On: 05/17/2019 11:23    Assessment & Plan:   Problem List Items Addressed This Visit     Barrett's esophagus - Primary   Periodic EGD - GI ref Cont on Prilosec      Relevant Orders   Ambulatory referral to Gastroenterology   Low back pain   Epidural inj is pending      Fatigue   Chronic - CFS      Atrial fibrillation [facility])    Cardiology f/u w/Dr Christean Courts Rate controlled Ablation is planned in July (Dr Lawana Pray)      Other Visit Diagnoses       History of colon polyps       Relevant Orders   Ambulatory referral to Gastroenterology         No orders of the defined types were placed in this encounter.  Labs are due - orders are in....   Follow-up: Return for a follow-up visit.  Anitra Barn, MD

## 2023-11-03 NOTE — Assessment & Plan Note (Signed)
 Epidural inj is pending

## 2023-11-03 NOTE — Assessment & Plan Note (Addendum)
  Cardiology f/u w/Dr Christean Courts Rate controlled Ablation is planned in July (Dr Lawana Pray)

## 2023-11-03 NOTE — Assessment & Plan Note (Signed)
 Periodic EGD - GI ref Cont on Prilosec

## 2023-11-03 NOTE — Assessment & Plan Note (Signed)
Chronic - CFS 

## 2023-11-04 DIAGNOSIS — M5416 Radiculopathy, lumbar region: Secondary | ICD-10-CM | POA: Diagnosis not present

## 2023-11-11 ENCOUNTER — Ambulatory Visit (HOSPITAL_COMMUNITY)
Admission: RE | Admit: 2023-11-11 | Discharge: 2023-11-11 | Disposition: A | Discharge status: Home / Self Care | Source: Ambulatory Visit | Attending: Cardiology | Admitting: Cardiology

## 2023-11-11 DIAGNOSIS — I358 Other nonrheumatic aortic valve disorders: Secondary | ICD-10-CM | POA: Insufficient documentation

## 2023-11-11 DIAGNOSIS — I4819 Other persistent atrial fibrillation: Secondary | ICD-10-CM

## 2023-11-11 DIAGNOSIS — Z01812 Encounter for preprocedural laboratory examination: Secondary | ICD-10-CM | POA: Diagnosis not present

## 2023-11-11 DIAGNOSIS — I480 Paroxysmal atrial fibrillation: Secondary | ICD-10-CM | POA: Diagnosis not present

## 2023-11-11 LAB — BASIC METABOLIC PANEL WITH GFR
BUN/Creatinine Ratio: 15 (ref 10–24)
BUN: 13 mg/dL (ref 8–27)
CO2: 20 mmol/L (ref 20–29)
Calcium: 9 mg/dL (ref 8.6–10.2)
Chloride: 104 mmol/L (ref 96–106)
Creatinine, Ser: 0.84 mg/dL (ref 0.76–1.27)
Glucose: 92 mg/dL (ref 70–99)
Potassium: 5 mmol/L (ref 3.5–5.2)
Sodium: 142 mmol/L (ref 134–144)
eGFR: 87 mL/min/{1.73_m2} (ref >59)

## 2023-11-11 MED ORDER — IOHEXOL 350 MG/ML SOLN
100.0000 mL | Freq: Once | INTRAVENOUS | Status: AC | PRN
  Administered 2023-11-11: 100 mL via INTRAVENOUS

## 2023-11-12 LAB — CBC
Hematocrit: 43.4 % (ref 37.5–51.0)
Hemoglobin: 14.4 g/dL (ref 13.0–17.7)
MCH: 30.3 pg (ref 26.6–33.0)
MCHC: 33.2 g/dL (ref 31.5–35.7)
MCV: 91 fL (ref 79–97)
Platelets: 270 10*3/uL (ref 150–450)
RBC: 4.76 x10E6/uL (ref 4.14–5.80)
RDW: 13.4 % (ref 11.6–15.4)
WBC: 9.7 10*3/uL (ref 3.4–10.8)

## 2023-11-23 ENCOUNTER — Telehealth (HOSPITAL_COMMUNITY): Payer: Self-pay

## 2023-11-23 DIAGNOSIS — Z01812 Encounter for preprocedural laboratory examination: Secondary | ICD-10-CM

## 2023-11-23 DIAGNOSIS — I4819 Other persistent atrial fibrillation: Secondary | ICD-10-CM

## 2023-11-23 NOTE — Telephone Encounter (Signed)
 Attempted to reach patient to discuss upcoming procedure, no answer. Left VM for patient to return call.

## 2023-11-24 ENCOUNTER — Encounter: Payer: Self-pay | Admitting: *Deleted

## 2023-11-24 NOTE — Addendum Note (Signed)
 Addended by: Margaree Sandhu O on: 11/24/2023 08:59 AM   Modules accepted: Orders

## 2023-11-24 NOTE — Telephone Encounter (Signed)
 Pt called back to update us  that he would need to reschedule his ablation (currently scheduled for 7/10).   Rescheduled pt to 8/7.  Educated to importance of not missing any blood thinner and to let office know if he misses one dose (CT completed on 6/19). Aware I will send updated letter of instructions via mychart. Sending to Dr. Inocencio for his FYI

## 2023-11-24 NOTE — Addendum Note (Signed)
 Addended by: GRETEL MAEOLA CROME on: 11/24/2023 03:13 PM   Modules accepted: Orders

## 2023-11-24 NOTE — Telephone Encounter (Signed)
 Patient returned call to discuss upcoming procedure.   CT: completed.  Labs: completed.   Any recent signs of acute illness or been started on antibiotics? No Any new medications started? No Any medications to hold? No Any missed doses of blood thinner? No Advised patient to continue taking ANTICOAGULANT: Eliquis  (Apixaban ) twice daily without missing any doses.  Medication instructions:  On the morning of your procedure DO NOT take any medication., including Eliquis  or the procedure may be rescheduled. Nothing to eat or drink after midnight prior to your procedure.  Confirmed patient is scheduled for Atrial Fibrillation Ablation on Thursday, July 10 with Dr. Soyla Norton. Instructed patient to arrive at the Main Entrance A at [facility]: [address] [place], [place] [address] and check in at Admitting at 10:30 AM.  Advised of plan to go home the same day and will only stay overnight if medically necessary. You MUST have a responsible adult to drive you home and MUST be with you the first 24 hours after you arrive home or your procedure could be cancelled.  Patient asked if he could drive himself home because his wife just had a hip replacement two days ago and will not be able to drive him. I explained the safety rationale post procedure for needing a driver and asked if he had a family member or neighbor that could drive him home. He stated his son lives in [place] but doesn't know if he is available and he does not have anyone else. He then asked if he could use an [place]. I explained that he would still need someone to ride with along with him and why. Patient stated he should probably postpone procedure until August or later when his wife can drive him. I encouraged patient to contact his son first or I offered to call his son and explain the situation. Patient stated, he would rather reach out to him and will contact office back if he can proceed or need to reschedule.

## 2023-11-28 NOTE — Progress Notes (Unsigned)
 Oscar Console, PA-C [address] [place], [place]  [address] Phone: [phone]   Primary Care Physician: Garald Karlynn GAILS, MD  Primary Gastroenterologist:  Oscar Console, PA-C / Elspeth Naval, MD   Chief Complaint: Discuss colonoscopy and EGD       HPI:   Oscar [name] is a 83 y.o. male, previous patient of Dr. Aneita, presents to discuss repeat colonoscopy and EGD.  He has personal history of colon polyps and Barrett's esophagus.  07/2022 EGD: Long segment Barrett's esophagus.  4 cm in length.  Medium hiatal hernia.  Mild gastritis.  10 mm nonbleeding duodenal diverticulum.  Biopsies showed Barrett's with low-grade dysplasia.  Omeprazole  increased to 40 Mg twice daily x 6 months.  Repeat EGD in 6 months to reevaluate for dysplasia (overdue).  09/2018 EGD: Long segment Barrett's esophagus, gastritis, medium hiatal hernia, nonbleeding duodenal diverticulum.  Biopsies confirmed Barrett's but no dysplasia.  09/2018 colonoscopy: 3 (5 mm to 7 mm) polyps removed.  Mild diverticulosis, internal hemorrhoids.  Pathology showed tubular adenoma and a hyperplastic polyp.  5-year repeat (due 09/2023).  Patient's mother had colon cancer in her 30s.  PMH: Atrial fibrillation, chronic asthma, hypertension.  Currently on Eliquis .  He is scheduled to have cardiac ablation 12/30/2023 for persistent A-fib (rate controlled).  He denies chest pain or shortness of breath.  Current symptoms: He denies any current GI symptoms.  He is taking omeprazole  40 Mg twice daily with good control of acid reflux.  Denies dysphagia or breakthrough heartburn.  Denies abdominal pain, diarrhea, constipation, melena, hematochezia, or weight loss.  Current Outpatient Medications  Medication Sig Dispense Refill   amLODipine-valsartan (EXFORGE) 10-320 MG tablet Take 1 tablet by mouth daily. Take 1 tablet by mouth daily.     apixaban  (ELIQUIS ) 5 MG TABS tablet Take 1 tablet (5 mg total) by mouth 2 (two) times daily.  180 tablet 1   ARIPiprazole (ABILIFY) 10 MG tablet Take 10 mg by mouth daily.     atorvastatin  (LIPITOR) 40 MG tablet TAKE 1 TABLET(40 MG) BY MOUTH DAILY 90 tablet 3   Cholecalciferol  (RA VITAMIN D -3) 25 MCG (1000 UT) tablet Take 1 tablet (1,000 Units total) by mouth daily. 90 tablet 3   clobetasol  (TEMOVATE ) 0.05 % external solution Apply 1 application topically 2 (two) times daily. Use for scalp lesions 50 mL 3   desvenlafaxine  (PRISTIQ ) 100 MG 24 hr tablet      dutasteride (AVODART) 0.5 MG capsule TK 1 C PO QD     famotidine-calcium  carbonate-magnesium hydroxide (PEPCID COMPLETE) 10-800-165 MG chewable tablet Chew 1 tablet by mouth daily as needed.     finasteride (PROSCAR) 5 MG tablet Take 1 tablet by mouth daily.     GEMTESA 75 MG TABS Take 1 tablet by mouth daily.     multivitamin-lutein (OCUVITE-LUTEIN) CAPS capsule Take 1 capsule daily by mouth.     Na Sulfate-K Sulfate-Mg Sulfate concentrate (SUPREP) 17.5-3.13-1.6 GM/177ML SOLN Take 1 kit (354 mLs total) by mouth once for 1 dose. 354 mL 0   omeprazole  (PRILOSEC) 20 MG capsule Take 2 capsules (40 mg total) by mouth daily. 30 capsule 11   sertraline (ZOLOFT) 100 MG tablet Take 100 mg by mouth daily.     tamsulosin (FLOMAX) 0.4 MG CAPS capsule Take 0.4 mg by mouth daily.     tiZANidine  (ZANAFLEX ) 4 MG tablet Take 1 tablet (4 mg total) by mouth every 8 (eight) hours as needed for muscle spasms (back pain). 60 tablet  1   torsemide (DEMADEX) 5 MG tablet Take 5 mg by mouth daily. Take 5 mg by mouth daily.     No current facility-administered medications for this visit.    Allergies as of 12/01/2023 - Review Complete 12/01/2023  Allergen Reaction Noted   Celecoxib  02/26/2010   Penicillins  02/26/2010   Rofecoxib      Past Medical History:  Diagnosis Date   Adenomatous colon polyp    Allergic rhinitis, cause unspecified    Allergy    Anxiety    Anxiety state, unspecified    Atrial fibrillation (HCC)    Barrett esophagus     Barrett esophagus    BPH (benign prostatic hypertrophy)    BX 2007   Cancer (HCC)    melanoma   Cataract    bilaterally removed   Depression    Depressive disorder, not elsewhere classified    GERD (gastroesophageal reflux disease)    Hypertension    Hypogonadism male    Microhematuria    Neuromuscular disorder (HCC)    small HH 05-26-2012 egd    Other and unspecified hyperlipidemia    Rosacea    UTI (lower urinary tract infection) 2009    Past Surgical History:  Procedure Laterality Date   CATARACT EXTRACTION  05/10/2012   left   COLONOSCOPY  04-22-2010   ESOPHAGOGASTRODUODENOSCOPY  05-26-2012   [facility]   HAND SURGERY Left    MELANOMA EXCISION     POLYPECTOMY     prev colon TA polyps- no polyps in 2011   PROSTATE BIOPSY  2011   Dr Pecola    UPPER GASTROINTESTINAL ENDOSCOPY      Review of Systems:    All systems reviewed and negative except where noted in HPI.    Physical Exam:  BP 124/62 (BP Location: Left Arm, Patient Position: Sitting, Cuff Size: Normal)   Pulse 90   Ht 6' (1.829 m)   Wt 235 lb (106.6 kg)   BMI 31.87 kg/m  No LMP for male patient.  General: Well-nourished, well-developed in no acute distress.  He appears younger than stated age.  He is able to get on and off exam table with no assistance.  Walks normally with no assistive devices. Lungs: Clear to auscultation bilaterally. Non-labored. Heart: Regular rate and rhythm, no murmurs rubs or gallops.  Abdomen: Bowel sounds are normal; Abdomen is Soft and obese; No hepatosplenomegaly, masses; midline ventral hernia/diastases. No Abdominal Tenderness; No guarding or rebound tenderness. Neuro: Alert and oriented x 3.  Grossly intact.  Psych: Alert and cooperative, normal mood and affect.  Imaging Studies: CT CARDIAC MORPH/PULM VEIN W/CM&W/O CA SCORE Addendum Date: 11/21/2023 ADDENDUM REPORT: 11/21/2023 14:10 EXAM: OVER-READ INTERPRETATION  CT CHEST The following report is an over-read performed by  radiologist Dr. Fonda Mom [facility] on 11/21/2023. This over-read does not include interpretation of cardiac or coronary anatomy or pathology. The coronary CTA interpretation by the cardiologist is attached. COMPARISON:  None. FINDINGS: Cardiovascular:  See findings discussed in the body of the report. Mediastinum/Nodes: No suspicious adenopathy identified. Imaged mediastinal structures are unremarkable. Lungs/Pleura: Imaged lungs are clear. No pleural effusion or pneumothorax. Upper Abdomen: No acute abnormality. Musculoskeletal: No chest wall abnormality. No acute osseous findings. IMPRESSION: No acute extracardiac incidental findings. Electronically Signed   By: Fonda Field M.D.   On: 11/21/2023 14:10   Result Date: 11/21/2023 CLINICAL DATA:  Atrial fibrillation scheduled for ablation. EXAM: Cardiac CTA TECHNIQUE: A non-contrast, gated CT scan was obtained with axial  slices of 2.5 mm through the heart for calcium  scoring. Calcium  scoring was performed using the Agatston method. A 120 kV prospective, gated, contrast cardiac scan was obtained. Gantry rotation speed was 230 msec and collimation was 0.63 mm. Nitroglycerin was not given. A delayed scan was obtained to exclude left atrial appendage thrombus. The 3D dataset was reconstructed in systole with motion correction. The 3D dataset was reconstructed at 5% intervals of the 25%-50% of the R-R cycle. Images were analyzed on a dedicated workstation using MPR, MIP, and VRT modes. The patient received 95 cc of contrast. FINDINGS: Image quality: Excellent. Noise artifact is: Limited. There is normal pulmonary vein drainage into the left atrium (2 on the right and 2 on the left). No anomalous pulmonary venous drainage. No pulmonary vein stenosis. Normal left atrial appendage, no left atrial appendage thrombus. No intracardiac mass or thrombus. Normal interatrial septum with no PFO or ASD. The esophagus is dilated and patulous, and runs  adjacent to the left upper pulmonary vein. Aorta: Normal caliber 37 mm mid ascending, 38 mm sinus of Valsalva. Aortic Valve: Mild aortic valve calcifications, AV calcium  score 370. Tricuspid aortic valve. Coronary Arteries: Normal coronary origin. Right dominance. The study was performed without use of NTG and insufficient for plaque evaluation. Coronary artery calcium  score is 131, which is 24th percentile for age and sex matched peers. Pulmonary artery: Mild dilation, 33 mm IMPRESSION: 1. There is normal pulmonary vein drainage into the left atrium. No pulmonary vein stenosis. 2. Normal left atrial appendage, no left atrial appendage thrombus. No intracardiac mass or thrombus. 3. The esophagus is patulous, and runs adjacent to the left upper pulmonary vein. 4. Mild aortic valve calcifications, AV calcium  score 370. 5. Coronary artery calcium  score is 131, which is 24th percentile for age and sex matched peers. Electronically Signed: By: Soyla Merck M.D. On: 11/11/2023 16:30    Labs: CBC    Component Value Date/Time   WBC 9.7 11/11/2023 1007   WBC 9.2 08/05/2021 1054   RBC 4.76 11/11/2023 1007   RBC 4.61 08/05/2021 1054   HGB 14.4 11/11/2023 1007   HCT 43.4 11/11/2023 1007   PLT 270 11/11/2023 1007   MCV 91 11/11/2023 1007   MCH 30.3 11/11/2023 1007   MCHC 33.2 11/11/2023 1007   MCHC 33.6 08/05/2021 1054   RDW 13.4 11/11/2023 1007   LYMPHSABS 2.3 08/05/2021 1054   MONOABS 0.8 08/05/2021 1054   EOSABS 0.2 08/05/2021 1054   BASOSABS 0.0 08/05/2021 1054    CMP     Component Value Date/Time   NA 142 11/11/2023 1008   K 5.0 11/11/2023 1008   CL 104 11/11/2023 1008   CO2 20 11/11/2023 1008   GLUCOSE 92 11/11/2023 1008   GLUCOSE 88 08/05/2021 1054   BUN 13 11/11/2023 1008   CREATININE 0.84 11/11/2023 1008   CALCIUM  9.0 11/11/2023 1008   PROT 6.9 08/05/2021 1054   ALBUMIN 4.5 08/05/2021 1054   AST 25 08/05/2021 1054   ALT 32 08/05/2021 1054   ALKPHOS 69 08/05/2021 1054    BILITOT 0.7 08/05/2021 1054   GFRNONAA 99.18 02/13/2010 0904   GFRAA 109 11/11/2007 0815       Assessment and Plan:   Oscar [name] is a 83 y.o. y/o male returns for follow-up of:  1.  Long segment Barrett's esophagus with low-grade dysplasia. - Scheduling EGD I discussed risks of EGD with patient to include risk of bleeding, perforation, and risk of sedation.  Patient expressed understanding and  agrees to proceed with EGD.   2.  GERD: Controlled on omeprazole  40 Mg twice daily. - Continue omeprazole  40 Mg twice daily. - Recommend Lifestyle Modifications to prevent Acid Reflux.  Rec. Avoid coffee, sodas, peppermint, garlic, onions, alcohol, citrus fruits, chocolate, tomatoes, fatty and spicey foods.  Avoid eating 2-3 hours before bedtime.    3.  History of adenomatous colon polyps - Scheduling Colonoscopy I discussed risks of colonoscopy with patient to include risk of bleeding, colon perforation, and risk of sedation.  Patient expressed understanding and agrees to proceed with colonoscopy.  - I instructed patient this would likely be his last colonoscopy due to advanced age and increased risk of procedure. - He appears to be in good health for his age overall.  4. Family History of Colon Cancer: Mother Age 51s - Schedule repeat colonoscopy.  5.  Afib / Chronic Anticoagulation; scheduled to have cardiac ablation 12/30/2023.  Cardiologist Dr. Inocencio. - We are requesting cardiac clearance and permission to hold Eliquis  2 days prior to colonoscopy. - Plan to do EGD and colonoscopy procedures before 12/30/2023.  Oscar Console, PA-C  Follow up as needed based on procedure results.

## 2023-12-01 ENCOUNTER — Ambulatory Visit: Admitting: Physician Assistant

## 2023-12-01 ENCOUNTER — Telehealth: Payer: Self-pay

## 2023-12-01 ENCOUNTER — Encounter: Payer: Self-pay | Admitting: Physician Assistant

## 2023-12-01 ENCOUNTER — Telehealth: Payer: Self-pay | Admitting: Physician Assistant

## 2023-12-01 VITALS — BP 124/62 | HR 90 | Ht 72.0 in | Wt 235.0 lb

## 2023-12-01 DIAGNOSIS — Z7901 Long term (current) use of anticoagulants: Secondary | ICD-10-CM | POA: Diagnosis not present

## 2023-12-01 DIAGNOSIS — I4819 Other persistent atrial fibrillation: Secondary | ICD-10-CM

## 2023-12-01 DIAGNOSIS — Z860100 Personal history of colon polyps, unspecified: Secondary | ICD-10-CM

## 2023-12-01 DIAGNOSIS — Z800 Family history of malignant neoplasm of digestive organs: Secondary | ICD-10-CM

## 2023-12-01 DIAGNOSIS — Z860101 Personal history of adenomatous and serrated colon polyps: Secondary | ICD-10-CM

## 2023-12-01 DIAGNOSIS — K219 Gastro-esophageal reflux disease without esophagitis: Secondary | ICD-10-CM | POA: Diagnosis not present

## 2023-12-01 DIAGNOSIS — I4891 Unspecified atrial fibrillation: Secondary | ICD-10-CM | POA: Diagnosis not present

## 2023-12-01 DIAGNOSIS — K22710 Barrett's esophagus with low grade dysplasia: Secondary | ICD-10-CM

## 2023-12-01 MED ORDER — NA SULFATE-K SULFATE-MG SULF 17.5-3.13-1.6 GM/177ML PO SOLN: 1.0000 | Freq: Once | ORAL | 0 refills | Status: AC

## 2023-12-01 NOTE — Telephone Encounter (Signed)
 [facility] Pre-operative Risk Assessment     Request for surgical clearance:     Endoscopy Procedure  What type of surgery is being performed?    Endoscopy and possible Colonoscopy  When is this surgery scheduled?     12/17/23  What type of clearance is required ?   Pharmacy  Are there any medications that need to be held prior to surgery and how long? Eliquis  2 days  Practice name and name of physician performing surgery?      [facility]  What is your office phone and fax number?      Phone- [phone]  Fax- [phone]  Anesthesia type (None, local, MAC, general) ?       MAC   Please route your response to Alethea Blocker, CMA

## 2023-12-01 NOTE — Progress Notes (Signed)
 Agree with assessment and plan as outlined. If you feel he is stable for [facility] to have EGD and colonoscopy done prior to ablation, can book him for this in the [facility] if approved to hold Eliquis  for 2 days prior to the exam. He is overdue for EGD with dysplasia and needs repeat biopsies done of the segment. Thanks

## 2023-12-01 NOTE — Telephone Encounter (Signed)
 Pharmacy please advise on holding Eliquis  for 2 days prior to Endoscopy Procedure  scheduled for 12/17/2023. Thank you.

## 2023-12-01 NOTE — Telephone Encounter (Signed)
 If you feel his a fibb is well controlled and stable for anesthesia prior to ablation, we can proceed in the [facility] if approved to hold Eliquis  for 2 days prior. I see he is scheduled for 7/25 for a double. Okay to leave him on the schedule for that but would like to start first case at 730 to accommodate the extra procedure.   [facility] charge - FYI - I have 9 procedures on the AM of 7/25, would like to start at 730 AM. Thanks

## 2023-12-01 NOTE — Telephone Encounter (Signed)
 Patient with diagnosis of A Fib on Eliquis  for anticoagulation. Patient scheduled for A Fib ablation 12/30/23  Procedure: Endoscopy and possible Colonoscopy  Date of procedure: 12/17/23   CHA2DS2-VASc Score = 3  This indicates a 3.2% annual risk of stroke. The patient's score is based upon: CHF History: 0 HTN History: 1 Diabetes History: 0 Stroke History: 0 Vascular Disease History: 0 Age Score: 2 Gender Score: 0    CrCl 102 ml/min Platelet count 270K  Patient scheduled for A Fib ablation on 12/30/23, Will not be able to hold Eliquis  for at least 3 weeks prior without approval from Dr Inocencio.   **This guidance is not considered finalized until pre-operative APP has relayed final recommendations.**

## 2023-12-01 NOTE — Patient Instructions (Signed)
 Continue Omeprazole  40 mg twice daily  You have been scheduled for an Endoscopy. Please follow written instructions given to you at your visit today.  If you use inhalers (even only as needed), please bring them with you on the day of your procedure.  If you take any of the following medications, they will need to be adjusted prior to your procedure:   DO NOT TAKE 7 DAYS PRIOR TO TEST- Trulicity (dulaglutide) Ozempic, Wegovy (semaglutide) Mounjaro (tirzepatide) Bydureon Bcise (exanatide extended release)  DO NOT TAKE 1 DAY PRIOR TO YOUR TEST Rybelsus (semaglutide) Adlyxin (lixisenatide) Victoza (liraglutide) Byetta (exanatide) ___________________________________________________________________________  Please follow up sooner if symptoms increase or worsen  Due to recent changes in healthcare laws, you may see the results of your imaging and laboratory studies on MyChart before your provider has had a chance to review them.  We understand that in some cases there may be results that are confusing or concerning to you. Not all laboratory results come back in the same time frame and the provider may be waiting for multiple results in order to interpret others.  Please give us  48 hours in order for your provider to thoroughly review all the results before contacting the office for clarification of your results.   Thank you for trusting me with your gastrointestinal care!   Ellouise Console, PA-C _______________________________________________________  If your blood pressure at your visit was 140/90 or greater, please contact your primary care physician to follow up on this.  _______________________________________________________  If you are age 20 or older, your body mass index should be between 23-30. Your Body mass index is 31.87 kg/m. If this is out of the aforementioned range listed, please consider follow up with your Primary Care Provider.  If you are age 92 or younger, your body  mass index should be between 19-25. Your Body mass index is 31.87 kg/m. If this is out of the aformentioned range listed, please consider follow up with your Primary Care Provider.   ________________________________________________________  The [facility] providers would like to encourage you to use MYCHART to communicate with providers for non-urgent requests or questions.  Due to long hold times on the telephone, sending your provider a message by [facility] may be a faster and more efficient way to get a response.  Please allow 48 business hours for a response.  Please remember that this is for non-urgent requests.  _______________________________________________________

## 2023-12-01 NOTE — Telephone Encounter (Signed)
 Dr. Inocencio,  You saw this patient on 09/07/2023. Per protocol we request that you comment on his cardiac risk to proceed with holding Eliquis  as requested by pharmacy note below,  since it has been less than 2 months since evaluated in the office. He is scheduled for Afib ablation on 12/30/2023 per your note. Please send your comment to P CV Pre-Op Pool.  Thank you, Lamarr Satterfield DNP, ANP, AACC.

## 2023-12-02 ENCOUNTER — Telehealth: Payer: Self-pay

## 2023-12-02 NOTE — Telephone Encounter (Signed)
   Patient Name: Oscar [name]  DOB: 12-15-1940 MRN: [id number]  Primary Cardiologist: Norleen CHRISTELLA Blare, MD  Chart reviewed as part of pre-operative protocol coverage.    Per Dr. Inocencio, Patient will be intermediate risk for intermediate risk procedure. No further cardiac testing is necessary. Patient will need to continue anticoagulation for 3 weeks prior to ablation and for 3 months after ablation. Patient should not hold anticoagulation during that 4 months. Outside of that window, patient can hold anticoagulation for colonoscopy.   Patient is currently scheduled for an atrial fibrillation ablation on 12/30/2023.  Per Dr. Inocencio patient must remain on anticoagulation for 3 weeks prior to ablation and for 3 months following ablation.  Per request received patient's endoscopy and colonoscopy is scheduled for 12/17/2023, this will be within 3 weeks of his ablation, patient cannot hold Eliquis  given timeframe with his ablation.  [name] [name], NP 12/02/2023, 8:05 AM

## 2023-12-02 NOTE — Telephone Encounter (Addendum)
 Dr.Armbruster I want to clarify, do you want  to do the EGD only on 12/17/23 at 0730?  Thank you

## 2023-12-02 NOTE — Telephone Encounter (Signed)
 [facility] Pre-operative Risk Assessment     Request for surgical clearance:     Endoscopy Procedure  What type of surgery is being performed?     Colonoscopy and Endoscopy   When is this surgery scheduled?     12/08/23  What type of clearance is required ?   Pharmacy  Are there any medications that need to be held prior to surgery and how long? Eliquis  2 days  Practice name and name of physician performing surgery?      [facility]  What is your office phone and fax number?      Phone- [phone]  Fax- [phone]  Anesthesia type (None, local, MAC, general) ?       MAC   Please route your response to Alethea Blocker, CMA

## 2023-12-02 NOTE — Telephone Encounter (Signed)
 I spoke with the patient this morning and he stated that he would not be able to come for the procedure on the 16th of July due to his wife having her stitches removed on the 15th from a recent hip replacement. He stated that he would just have to wait until end of October or November if he isn't cleared by Cardiology.

## 2023-12-02 NOTE — Telephone Encounter (Signed)
 Got it.  I can do an EGD ON Eliquis  for the Barrett's, which is the more pressing exam. If you let him know we can keep him on the schedule for 7/25 if AF is stable, or do it after his ablation to make sure AF more stable, perhaps late August, do get that done sooner. Let me know what you think. Thanks

## 2023-12-03 NOTE — Telephone Encounter (Signed)
 Got it, thanks a lot Dottie I appreciate your follow-up with this.  You can cancel his procedure on 7/25 if not yet already done.  Thanks

## 2023-12-03 NOTE — Telephone Encounter (Signed)
 Thanks for clarification Jarmilla. Yes if he wants to proceed on 7/25 it would be EGD only on Eliquis , not colonoscopy.  Up to him. If he wants to wait until cleared for both and do both at the same time in November he can do that, however with dysplasia on last EGD and overdue, I am concerned about waiting that long.  POD A RN - can someone contact him and see how he wants to proceed? EGD on 7/25 on Eliquis  or whenever is good for him to do this, or do both procedures later after his ablation and when cleared to hold Eliquis ? Thanks

## 2023-12-03 NOTE — Telephone Encounter (Signed)
 Patient is advised of Dr Hassan offer to complete endoscopy ON eliquis  since there was previously diagnosed barretts with dysplasia that is overdue for surveillance. Per Dr Leigh, he could still do some biopsies at endoscopy even on eliquis . Patient admits that he, too, is nervous about putting endoscopy off. Unfortunately, though, he states he will not be able to have endoscopy at any point over the next couple of weeks because his wife/care partner just had hip replacement surgery and is unable to drive for 6 weeks. Patient states that he is stuck between a rock and a hard place in regards to endoscopy/colonoscopy timing. Patient is advised that we will revisit rescheduling procedures in about 3 months (following cardiac ablation). He verbalizes understanding. Recall reminder placed to myself to follow this case.

## 2023-12-08 ENCOUNTER — Encounter: Admitting: Gastroenterology

## 2023-12-17 ENCOUNTER — Encounter: Admitting: Gastroenterology

## 2023-12-20 ENCOUNTER — Telehealth: Payer: Self-pay | Admitting: Cardiology

## 2023-12-20 NOTE — Telephone Encounter (Signed)
 Spoke with patient and advised that he will need to have lab work done prior to his procedure. Patient aware that he can have this done at any [facility] location. He states that he will get it done this week.

## 2023-12-20 NOTE — Telephone Encounter (Signed)
 Pt called and just wanted to see if there was to be another blood draw before the Ablation    [facility] number  [phone]

## 2023-12-22 ENCOUNTER — Telehealth (HOSPITAL_COMMUNITY): Payer: Self-pay

## 2023-12-22 NOTE — Telephone Encounter (Signed)
 Spoke with patient to discuss upcoming procedure.   CT: completed.  Labs: plans to complete 7/31  Any recent signs of acute illness or been started on antibiotics? No Any new medications started? No Any medications to hold? No Any missed doses of blood thinner? No Advised patient to continue taking ANTICOAGULANT: Eliquis  (Apixaban ) twice daily without missing any doses.  Medication instructions:  On the morning of your procedure DO NOT take any medication., including Eliquis  or the procedure may be rescheduled. Nothing to eat or drink after midnight prior to your procedure.  Confirmed patient is scheduled for Atrial Fibrillation Ablation on Thursday, August 7 with Dr. Soyla Norton. Instructed patient to arrive at the Main Entrance A at [facility]: [address] [place], [place] [address] and check in at Admitting at 5:30 AM.  Advised of plan to go home the same day and will only stay overnight if medically necessary. You MUST have a responsible adult to drive you home and MUST be with you the first 24 hours after you arrive home or your procedure could be cancelled.  Patient verbalized understanding to all instructions provided and agreed to proceed with procedure.

## 2023-12-23 DIAGNOSIS — I4819 Other persistent atrial fibrillation: Secondary | ICD-10-CM | POA: Diagnosis not present

## 2023-12-23 DIAGNOSIS — Z01812 Encounter for preprocedural laboratory examination: Secondary | ICD-10-CM | POA: Diagnosis not present

## 2023-12-24 LAB — BASIC METABOLIC PANEL WITH GFR
BUN/Creatinine Ratio: 17 (ref 10–24)
BUN: 14 mg/dL (ref 8–27)
CO2: 21 mmol/L (ref 20–29)
Calcium: 8.8 mg/dL (ref 8.6–10.2)
Chloride: 101 mmol/L (ref 96–106)
Creatinine, Ser: 0.83 mg/dL (ref 0.76–1.27)
Glucose: 87 mg/dL (ref 70–99)
Potassium: 4.5 mmol/L (ref 3.5–5.2)
Sodium: 141 mmol/L (ref 134–144)
eGFR: 87 mL/min/1.73 (ref >59)

## 2023-12-24 LAB — CBC
Hematocrit: 43.7 % (ref 37.5–51.0)
Hemoglobin: 14.4 g/dL (ref 13.0–17.7)
MCH: 30.8 pg (ref 26.6–33.0)
MCHC: 33 g/dL (ref 31.5–35.7)
MCV: 93 fL (ref 79–97)
Platelets: 279 x10E3/uL (ref 150–450)
RBC: 4.68 x10E6/uL (ref 4.14–5.80)
RDW: 13 % (ref 11.6–15.4)
WBC: 10.9 x10E3/uL — ABNORMAL HIGH (ref 3.4–10.8)

## 2023-12-29 ENCOUNTER — Encounter: Payer: Self-pay | Admitting: Emergency Medicine

## 2023-12-29 NOTE — Pre-Procedure Instructions (Signed)
 Attempted to call patient regarding procedure instructions.  Left voicemail on the following items: Arrival time 0515 Nothing to eat or drink after midnight No meds AM of procedure Responsible person to drive you home and stay with you for 24 hrs  Have you missed any doses of anti-coagulant Eliquis- should be taken twice a day, if you have missed any doses please let us know.  Don't take dose morning of procedure.

## 2023-12-30 ENCOUNTER — Ambulatory Visit (HOSPITAL_COMMUNITY)
Admission: RE | Admit: 2023-12-30 | Discharge: 2023-12-30 | Disposition: A | Discharge status: Home / Self Care | Attending: Cardiology | Admitting: Cardiology

## 2023-12-30 ENCOUNTER — Other Ambulatory Visit: Payer: Self-pay

## 2023-12-30 ENCOUNTER — Encounter (HOSPITAL_COMMUNITY)
Admission: RE | Disposition: A | Discharge status: Home / Self Care | Payer: Self-pay | Source: Home / Self Care | Attending: Cardiology

## 2023-12-30 ENCOUNTER — Ambulatory Visit (HOSPITAL_COMMUNITY): Admitting: Anesthesiology

## 2023-12-30 ENCOUNTER — Ambulatory Visit (HOSPITAL_BASED_OUTPATIENT_CLINIC_OR_DEPARTMENT_OTHER): Admitting: Anesthesiology

## 2023-12-30 DIAGNOSIS — I4891 Unspecified atrial fibrillation: Secondary | ICD-10-CM | POA: Diagnosis not present

## 2023-12-30 DIAGNOSIS — F418 Other specified anxiety disorders: Secondary | ICD-10-CM | POA: Diagnosis not present

## 2023-12-30 DIAGNOSIS — Z87891 Personal history of nicotine dependence: Secondary | ICD-10-CM

## 2023-12-30 DIAGNOSIS — Z6831 Body mass index (BMI) 31.0-31.9, adult: Secondary | ICD-10-CM | POA: Insufficient documentation

## 2023-12-30 DIAGNOSIS — E669 Obesity, unspecified: Secondary | ICD-10-CM | POA: Insufficient documentation

## 2023-12-30 DIAGNOSIS — I4819 Other persistent atrial fibrillation: Secondary | ICD-10-CM | POA: Diagnosis not present

## 2023-12-30 DIAGNOSIS — K219 Gastro-esophageal reflux disease without esophagitis: Secondary | ICD-10-CM | POA: Insufficient documentation

## 2023-12-30 DIAGNOSIS — I10 Essential (primary) hypertension: Secondary | ICD-10-CM | POA: Insufficient documentation

## 2023-12-30 DIAGNOSIS — I483 Typical atrial flutter: Secondary | ICD-10-CM

## 2023-12-30 HISTORY — PX: ATRIAL FIBRILLATION ABLATION: EP1191

## 2023-12-30 MED ORDER — HEPARIN (PORCINE) IN NACL 1000-0.9 UT/500ML-% IV SOLN
INTRAVENOUS | Status: DC | PRN
  Administered 2023-12-30 (×3): 500 mL

## 2023-12-30 MED ORDER — SODIUM CHLORIDE 0.9 % IV SOLN: 250.0000 mL | INTRAVENOUS | Status: DC | PRN

## 2023-12-30 MED ORDER — DEXAMETHASONE SODIUM PHOSPHATE 10 MG/ML IJ SOLN
INTRAMUSCULAR | Status: DC | PRN
  Administered 2023-12-30: 5 mg via INTRAVENOUS

## 2023-12-30 MED ORDER — ACETAMINOPHEN 325 MG PO TABS: 650.0000 mg | ORAL_TABLET | ORAL | Status: DC | PRN

## 2023-12-30 MED ORDER — SODIUM CHLORIDE 0.9 % IV SOLN
INTRAVENOUS | Status: DC
Start: 2023-12-30 — End: 2023-12-30

## 2023-12-30 MED ORDER — ONDANSETRON HCL 4 MG/2ML IJ SOLN
INTRAMUSCULAR | Status: DC | PRN
  Administered 2023-12-30: 4 mg via INTRAVENOUS

## 2023-12-30 MED ORDER — ONDANSETRON HCL 4 MG/2ML IJ SOLN: 4.0000 mg | Freq: Four times a day (QID) | INTRAMUSCULAR | Status: DC | PRN

## 2023-12-30 MED ORDER — PHENYLEPHRINE HCL-NACL 20-0.9 MG/250ML-% IV SOLN
INTRAVENOUS | Status: DC | PRN
  Administered 2023-12-30: 20 ug/min via INTRAVENOUS

## 2023-12-30 MED ORDER — PROTAMINE SULFATE 10 MG/ML IV SOLN
INTRAVENOUS | Status: DC | PRN
Start: 2023-12-30 — End: 2023-12-30
  Administered 2023-12-30: 40 mg via INTRAVENOUS

## 2023-12-30 MED ORDER — LIDOCAINE 2% (20 MG/ML) 5 ML SYRINGE
INTRAMUSCULAR | Status: DC | PRN
Start: 2023-12-30 — End: 2023-12-30
  Administered 2023-12-30: 60 mg via INTRAVENOUS

## 2023-12-30 MED ORDER — ROCURONIUM BROMIDE 10 MG/ML (PF) SYRINGE
PREFILLED_SYRINGE | INTRAVENOUS | Status: DC | PRN
  Administered 2023-12-30: 60 mg via INTRAVENOUS
  Administered 2023-12-30 (×2): 10 mg via INTRAVENOUS

## 2023-12-30 MED ORDER — SUGAMMADEX SODIUM 200 MG/2ML IV SOLN
INTRAVENOUS | Status: DC | PRN
  Administered 2023-12-30: 200 mg via INTRAVENOUS

## 2023-12-30 MED ORDER — ATROPINE SULFATE 1 MG/ML IV SOLN
INTRAVENOUS | Status: DC | PRN
  Administered 2023-12-30: 1 mg via INTRAVENOUS

## 2023-12-30 MED ORDER — PHENYLEPHRINE 80 MCG/ML (10ML) SYRINGE FOR IV PUSH (FOR BLOOD PRESSURE SUPPORT)
PREFILLED_SYRINGE | INTRAVENOUS | Status: DC | PRN
  Administered 2023-12-30: 80 ug via INTRAVENOUS
  Administered 2023-12-30: 160 ug via INTRAVENOUS
  Administered 2023-12-30: 120 ug via INTRAVENOUS

## 2023-12-30 MED ORDER — HEPARIN SODIUM (PORCINE) 1000 UNIT/ML IJ SOLN
INTRAMUSCULAR | Status: DC | PRN
  Administered 2023-12-30: 4000 [IU] via INTRAVENOUS
  Administered 2023-12-30: 15000 [IU] via INTRAVENOUS

## 2023-12-30 MED ORDER — PROPOFOL 10 MG/ML IV BOLUS
INTRAVENOUS | Status: DC | PRN
  Administered 2023-12-30: 150 mg via INTRAVENOUS

## 2023-12-30 MED ORDER — SODIUM CHLORIDE 0.9% FLUSH
3.0000 mL | INTRAVENOUS | Status: DC | PRN
Start: 2023-12-30 — End: 2023-12-30

## 2023-12-30 NOTE — Progress Notes (Signed)
 Patient walked to the bathroom without difficulties. Bilateral groins level 0, clean, dry, and intact.

## 2023-12-30 NOTE — H&P (Signed)
  Electrophysiology Office Note:   Date:  12/30/2023  ID:  Oscar [name], DOB 11-Apr-1941, MRN [id number]  Primary Cardiologist: Norleen CHRISTELLA Blare, MD Primary Heart Failure: None Electrophysiologist: Kahleah Crass Gladis Norton, MD      History of Present Illness:   Oscar [name] is a 83 y.o. male with h/o hypertension, hyperlipidemia, atrial fibrillation seen today for  for Electrophysiology evaluation of atrial fibrillation at the request of Norleen Blare.    Today, denies symptoms of palpitations, chest pain, orthopnea, PND, lower extremity edema, claudication, dizziness, presyncope, syncope, bleeding, or neurologic sequela. The patient is tolerating medications without difficulties.  He has been having episodes of dyspnea for approximately the last year.  He states that he has been short of breath intermittently for a few years, but this has gotten worse.  He continues to be able to do his daily activities, but has to do them more slowly.   Today, denies symptoms of palpitations, chest pain, dyspnea, orthopnea, PND, lower extremity edema, claudication, dizziness, presyncope, syncope, bleeding, or neurologic sequela. The patient is tolerating medications without difficulties. Plan ablation today.  EP Information / Studies Reviewed:    EKG is ordered today. Personal review as below.        Risk Assessment/Calculations:    CHA2DS2-VASc Score = 3   This indicates a 3.2% annual risk of stroke. The patient's score is based upon: CHF History: 0 HTN History: 1 Diabetes History: 0 Stroke History: 0 Vascular Disease History: 0 Age Score: 2 Gender Score: 0            Physical Exam:   VS:  BP (!) 142/94   Pulse (!) 56   Temp 98.1 F (36.7 C)   Resp 20   Ht 6' (1.829 m)   Wt 104.3 kg   SpO2 97%   BMI 31.19 kg/m    Wt Readings from Last 3 Encounters:  12/30/23 104.3 kg  12/01/23 106.6 kg  11/03/23 106.6 kg    GEN: No acute distress.   Neck: No JVD Cardiac: RRR, no murmurs, rubs, or  gallops.  Respiratory: normal BS bilaterally. GI: Soft, nontender, non-distended  MS: No edema; No deformity. Neuro:  Nonfocal  Skin: warm and dry Psych: Normal affect    ASSESSMENT AND PLAN:    1.  Persistent atrial fibrillation: Oscar [name] has presented today for surgery, with the diagnosis of AF.  The various methods of treatment have been discussed with the patient and family. After consideration of risks, benefits and other options for treatment, the patient has consented to  Procedure(s): Catheter ablation as a surgical intervention .  Risks include but not limited to complete heart block, stroke, esophageal damage, nerve damage, bleeding, vascular damage, tamponade, perforation, MI, and death. The patient's history has been reviewed, patient examined, no change in status, stable for surgery.  I have reviewed the patient's chart and labs.  Questions were answered to the patient's satisfaction.    Cassandra Mcmanaman Norton, MD 12/30/2023 7:03 AM

## 2023-12-30 NOTE — Anesthesia Postprocedure Evaluation (Signed)
 Anesthesia Post Note  Patient: Training and development officer  Procedure(s) Performed: ATRIAL FIBRILLATION ABLATION     Patient location during evaluation: PACU Anesthesia Type: General Level of consciousness: awake and alert, oriented and patient cooperative Pain management: pain level controlled Vital Signs Assessment: post-procedure vital signs reviewed and stable Respiratory status: spontaneous breathing, nonlabored ventilation and respiratory function stable Cardiovascular status: blood pressure returned to baseline and stable Postop Assessment: no apparent nausea or vomiting Anesthetic complications: no   There were no known notable events for this encounter.  Last Vitals:  Vitals:   12/30/23 0932 12/30/23 0940  BP: 113/68 110/72  Pulse: 62 60  Resp: (!) 22 20  Temp:    SpO2: 92% 93%    Last Pain:  Vitals:   12/30/23 0949  TempSrc:   PainSc: 0-No pain                 Almarie CHRISTELLA Marchi

## 2023-12-30 NOTE — Anesthesia Preprocedure Evaluation (Addendum)
 Anesthesia Evaluation  Patient identified by MRN, date of birth, ID band Patient awake    Reviewed: Allergy & Precautions, H&P , NPO status , Patient's Chart, lab work & pertinent test results  Airway Mallampati: III  TM Distance: >3 FB Neck ROM: Full    Dental  (+) Teeth Intact, Dental Advisory Given   Pulmonary asthma (no inhalers) , former smoker Former smoker, 30 pack year history    Pulmonary exam normal breath sounds clear to auscultation       Cardiovascular hypertension (142/94 preop), Pt. on medications + DOE (w. Afib)  Normal cardiovascular exam+ dysrhythmias (eliquis ) Atrial Fibrillation  Rhythm:Regular Rate:Normal  Echo 2022: LVEF 65%, normal valves   Neuro/Psych  PSYCHIATRIC DISORDERS Anxiety Depression    negative neurological ROS     GI/Hepatic Neg liver ROS,GERD  Controlled,,  Endo/Other  BMI 31  Renal/GU negative Renal ROS  negative genitourinary   Musculoskeletal negative musculoskeletal ROS (+)    Abdominal  (+) + obese  Peds negative pediatric ROS (+)  Hematology negative hematology ROS (+)   Anesthesia Other Findings   Reproductive/Obstetrics negative OB ROS                              Anesthesia Physical Anesthesia Plan  ASA: 3  Anesthesia Plan: General   Post-op Pain Management:    Induction: Intravenous  PONV Risk Score and Plan: Treatment may vary due to age or medical condition, Ondansetron  and Dexamethasone   Airway Management Planned: Oral ETT  Additional Equipment: None  Intra-op Plan:   Post-operative Plan: Extubation in OR  Informed Consent: I have reviewed the patients History and Physical, chart, labs and discussed the procedure including the risks, benefits and alternatives for the proposed anesthesia with the patient or authorized representative who has indicated his/her understanding and acceptance.     Dental advisory given  Plan  Discussed with: CRNA  Anesthesia Plan Comments:          Anesthesia Quick Evaluation

## 2023-12-30 NOTE — Discharge Instructions (Signed)

## 2023-12-30 NOTE — Transfer of Care (Signed)
 Immediate Anesthesia Transfer of Care Note  Patient: Oscar [name]  Procedure(s) Performed: ATRIAL FIBRILLATION ABLATION  Patient Location: Cath Lab  Anesthesia Type:General  Level of Consciousness: awake, alert , and oriented  Airway & Oxygen Therapy: Patient Spontanous Breathing and Patient connected to nasal cannula oxygen  Post-op Assessment: Report given to RN and Post -op Vital signs reviewed and stable  Post vital signs: Reviewed and stable  Last Vitals:  Vitals Value Taken Time  BP 116/65 12/30/23 09:15  Temp    Pulse 62 12/30/23 09:19  Resp 15 12/30/23 09:19  SpO2 91 % 12/30/23 09:19  Vitals shown include unfiled device data.  Last Pain:  Vitals:   12/30/23 0548  PainSc: 0-No pain         Complications: There were no known notable events for this encounter.

## 2023-12-30 NOTE — Anesthesia Procedure Notes (Signed)
 Procedure Name: Intubation Date/Time: 12/30/2023 7:48 AM  Performed by: Elby Raelene SAUNDERS, CRNAPre-anesthesia Checklist: Patient identified, Emergency Drugs available, Suction available and Patient being monitored Patient Re-evaluated:Patient Re-evaluated prior to induction Oxygen Delivery Method: Circle System Utilized Preoxygenation: Pre-oxygenation with 100% oxygen Induction Type: IV induction Ventilation: Mask ventilation without difficulty Laryngoscope Size: Miller and 2 Grade View: Grade II Tube type: Oral Tube size: 7.5 mm Number of attempts: 1 Airway Equipment and Method: Stylet and Bite block Placement Confirmation: ETT inserted through vocal cords under direct vision, positive ETCO2 and breath sounds checked- equal and bilateral Secured at: 23 cm Tube secured with: Tape Dental Injury: Teeth and Oropharynx as per pre-operative assessment

## 2023-12-31 ENCOUNTER — Encounter (HOSPITAL_COMMUNITY): Payer: Self-pay | Admitting: Cardiology

## 2023-12-31 LAB — POCT ACTIVATED CLOTTING TIME: Activated Clotting Time: 297 s

## 2024-01-03 ENCOUNTER — Telehealth (HOSPITAL_COMMUNITY): Payer: Self-pay | Admitting: *Deleted

## 2024-01-03 NOTE — Telephone Encounter (Signed)
 Patient called in stating since ablation he is having increased shortness of breath and significant orthopnea over the weekend. Denies weight gain or abdominal bloating. Discussed with Daril Kicks PA will increase toresmide to 10mg  daily for the next 3 days then call with update. Pt in agreement.

## 2024-01-05 NOTE — Telephone Encounter (Signed)
 Patient shortness of breath improved but not back to baseline. Discussed with Daril Kicks PA will continue with extra dose of toresmide for the next 3 days then return to normal dosing. Pt does endorse a 3lb weight loss since increasing.

## 2024-01-12 NOTE — Telephone Encounter (Signed)
 Pt still has some shortness  of breath with exertion but feels to be in normal rhythm and weight has normalized. Pt will return to normal dosing of toresmide and call if shortness of breath worsens on his daily dose. Pt in agreement.

## 2024-01-19 ENCOUNTER — Encounter: Payer: Self-pay | Admitting: Gastroenterology

## 2024-01-27 ENCOUNTER — Encounter (HOSPITAL_COMMUNITY): Payer: Self-pay | Admitting: Internal Medicine

## 2024-01-27 ENCOUNTER — Ambulatory Visit (HOSPITAL_COMMUNITY)
Admission: RE | Admit: 2024-01-27 | Discharge: 2024-01-27 | Disposition: A | Discharge status: Home / Self Care | Source: Ambulatory Visit | Attending: Internal Medicine | Admitting: Internal Medicine

## 2024-01-27 VITALS — BP 138/66 | HR 64 | Ht 72.0 in | Wt 234.8 lb

## 2024-01-27 DIAGNOSIS — I4819 Other persistent atrial fibrillation: Secondary | ICD-10-CM | POA: Diagnosis not present

## 2024-01-27 DIAGNOSIS — R739 Hyperglycemia, unspecified: Secondary | ICD-10-CM

## 2024-01-27 DIAGNOSIS — D6869 Other thrombophilia: Secondary | ICD-10-CM | POA: Diagnosis not present

## 2024-01-27 NOTE — Progress Notes (Signed)
 Primary Care Physician: Garald Karlynn GAILS, MD Primary Cardiologist: Norleen CHRISTELLA Blare, MD Electrophysiologist: Will Gladis Norton, MD     Referring Physician: Dr. Norton Dalton Stoneking is a 83 y.o. male with a history of HTN, HLD, and atrial fibrillation who presents for consultation in the [facility]. Patient is on Eliquis  for a CHADS2VASC score of 3.  On evaluation today, patient is currently in NSR. S/p Afib ablation on 12/30/23 by Dr. Norton. No episodes of Afib since ablation. He notes some SOB with activity and fatigue. No chest pain. Leg sites healed without issue. No missed doses of anticoagulant.  Today, he denies symptoms of orthopnea, PND, lower extremity edema, dizziness, presyncope, syncope, snoring, daytime somnolence, bleeding, or neurologic sequela. The patient is tolerating medications without difficulties and is otherwise without complaint today.    he has a BMI of Body mass index is 31.84 kg/m.SABRA Filed Weights   01/27/24 1357  Weight: 106.5 kg    Current Outpatient Medications  Medication Sig Dispense Refill   amLODipine-valsartan (EXFORGE) 10-320 MG tablet Take 1 tablet by mouth daily. Take 1 tablet by mouth daily.     apixaban  (ELIQUIS ) 5 MG TABS tablet Take 1 tablet (5 mg total) by mouth 2 (two) times daily. 180 tablet 1   ARIPiprazole (ABILIFY) 10 MG tablet Take 10 mg by mouth daily.     atorvastatin  (LIPITOR) 40 MG tablet TAKE 1 TABLET(40 MG) BY MOUTH DAILY 90 tablet 3   desvenlafaxine  (PRISTIQ ) 100 MG 24 hr tablet Take 100 mg by mouth daily.     famotidine-calcium  carbonate-magnesium hydroxide (PEPCID COMPLETE) 10-800-165 MG chewable tablet Chew 2 tablets by mouth every evening.     finasteride (PROSCAR) 5 MG tablet Take 5 mg by mouth daily.     GEMTESA 75 MG TABS Take 75 mg by mouth every other day.     ibuprofen (ADVIL) 200 MG tablet Take 400 mg by mouth 2 (two) times daily.     Multiple Vitamins-Minerals (PRESERVISION  AREDS 2+MULTI VIT PO) Take 1 capsule by mouth 2 (two) times daily.     sertraline (ZOLOFT) 100 MG tablet Take 100 mg by mouth daily.     tamsulosin (FLOMAX) 0.4 MG CAPS capsule Take 0.4 mg by mouth daily.     torsemide (DEMADEX) 5 MG tablet Take 5 mg by mouth daily.     No current facility-administered medications for this encounter.    Atrial Fibrillation Management history:  Previous antiarrhythmic drugs: none Previous cardioversions:  Previous ablations: 12/30/23 Anticoagulation history: Eliquis    ROS- All systems are reviewed and negative except as per the HPI above.  Physical Exam: BP 138/66   Pulse 64   Ht 6' (1.829 m)   Wt 106.5 kg   BMI 31.84 kg/m   GEN: Well nourished, well developed in no acute distress NECK: No JVD; No carotid bruits CARDIAC: Regular rate and rhythm, no murmurs, rubs, gallops RESPIRATORY:  Clear to auscultation without rales, wheezing or rhonchi  ABDOMEN: Soft, non-tender, non-distended EXTREMITIES:  No edema; No deformity   EKG today demonstrates  Vent. rate 64 BPM PR interval 204 ms QRS duration 124 ms QT/QTcB 428/441 ms P-R-T axes 34 -47 65 Normal sinus rhythm Left anterior fascicular block Minimal voltage criteria for LVH, may be normal variant ( Cornell product ) Nonspecific ST abnormality Abnormal ECG When compared with ECG of 30-Dec-2023 09:24, No significant change was found   ASSESSMENT & PLAN CHA2DS2-VASc Score = 3  The patient's score is based upon: CHF History: 0 HTN History: 1 Diabetes History: 0 Stroke History: 0 Vascular Disease History: 0 Age Score: 2 Gender Score: 0       ASSESSMENT AND PLAN: Persistent Atrial Fibrillation (ICD10:  I48.19) The patient's CHA2DS2-VASc score is 3, indicating a 3.2% annual risk of stroke.   S/p Afib ablation on 12/30/23 by Dr. Inocencio.  Patient is currently in NSR.   Secondary Hypercoagulable State (ICD10:  D68.69) The patient is at significant risk for stroke/thromboembolism  based upon his CHA2DS2-VASc Score of 3.  Continue Apixaban  (Eliquis ).  Continue OAC.   Follow up with EP as scheduled.    [facility] [address] [place], [place] [address] [phone]

## 2024-02-03 DIAGNOSIS — I340 Nonrheumatic mitral (valve) insufficiency: Secondary | ICD-10-CM | POA: Diagnosis not present

## 2024-02-03 DIAGNOSIS — I350 Nonrheumatic aortic (valve) stenosis: Secondary | ICD-10-CM | POA: Diagnosis not present

## 2024-02-03 DIAGNOSIS — I4891 Unspecified atrial fibrillation: Secondary | ICD-10-CM | POA: Diagnosis not present

## 2024-02-03 DIAGNOSIS — I4719 Other supraventricular tachycardia: Secondary | ICD-10-CM | POA: Diagnosis not present

## 2024-02-03 DIAGNOSIS — I10 Essential (primary) hypertension: Secondary | ICD-10-CM | POA: Diagnosis not present

## 2024-02-03 DIAGNOSIS — R0602 Shortness of breath: Secondary | ICD-10-CM | POA: Diagnosis not present

## 2024-02-03 DIAGNOSIS — I259 Chronic ischemic heart disease, unspecified: Secondary | ICD-10-CM | POA: Diagnosis not present

## 2024-02-03 DIAGNOSIS — I517 Cardiomegaly: Secondary | ICD-10-CM | POA: Diagnosis not present

## 2024-02-10 ENCOUNTER — Encounter (INDEPENDENT_AMBULATORY_CARE_PROVIDER_SITE_OTHER): Payer: PPO | Admitting: Ophthalmology

## 2024-02-10 DIAGNOSIS — H43813 Vitreous degeneration, bilateral: Secondary | ICD-10-CM

## 2024-02-10 DIAGNOSIS — H353132 Nonexudative age-related macular degeneration, bilateral, intermediate dry stage: Secondary | ICD-10-CM

## 2024-02-10 DIAGNOSIS — H33301 Unspecified retinal break, right eye: Secondary | ICD-10-CM

## 2024-02-10 DIAGNOSIS — I10 Essential (primary) hypertension: Secondary | ICD-10-CM

## 2024-02-10 DIAGNOSIS — H3554 Dystrophies primarily involving the retinal pigment epithelium: Secondary | ICD-10-CM | POA: Diagnosis not present

## 2024-02-10 DIAGNOSIS — H35033 Hypertensive retinopathy, bilateral: Secondary | ICD-10-CM

## 2024-02-24 DIAGNOSIS — Z08 Encounter for follow-up examination after completed treatment for malignant neoplasm: Secondary | ICD-10-CM | POA: Diagnosis not present

## 2024-02-24 DIAGNOSIS — L814 Other melanin hyperpigmentation: Secondary | ICD-10-CM | POA: Diagnosis not present

## 2024-02-24 DIAGNOSIS — L538 Other specified erythematous conditions: Secondary | ICD-10-CM | POA: Diagnosis not present

## 2024-02-24 DIAGNOSIS — L820 Inflamed seborrheic keratosis: Secondary | ICD-10-CM | POA: Diagnosis not present

## 2024-02-24 DIAGNOSIS — Z789 Other specified health status: Secondary | ICD-10-CM | POA: Diagnosis not present

## 2024-02-24 DIAGNOSIS — L720 Epidermal cyst: Secondary | ICD-10-CM | POA: Diagnosis not present

## 2024-02-24 DIAGNOSIS — Z86006 Personal history of melanoma in-situ: Secondary | ICD-10-CM | POA: Diagnosis not present

## 2024-02-24 DIAGNOSIS — D1801 Hemangioma of skin and subcutaneous tissue: Secondary | ICD-10-CM | POA: Diagnosis not present

## 2024-02-24 DIAGNOSIS — Z85828 Personal history of other malignant neoplasm of skin: Secondary | ICD-10-CM | POA: Diagnosis not present

## 2024-02-24 DIAGNOSIS — L2989 Other pruritus: Secondary | ICD-10-CM | POA: Diagnosis not present

## 2024-02-24 DIAGNOSIS — L821 Other seborrheic keratosis: Secondary | ICD-10-CM | POA: Diagnosis not present

## 2024-02-28 HISTORY — PX: OTHER SURGICAL HISTORY: SHX169

## 2024-03-03 DIAGNOSIS — M5416 Radiculopathy, lumbar region: Secondary | ICD-10-CM | POA: Diagnosis not present

## 2024-03-03 DIAGNOSIS — M545 Low back pain, unspecified: Secondary | ICD-10-CM | POA: Diagnosis not present

## 2024-03-03 DIAGNOSIS — M51369 Other intervertebral disc degeneration, lumbar region without mention of lumbar back pain or lower extremity pain: Secondary | ICD-10-CM | POA: Diagnosis not present

## 2024-03-06 ENCOUNTER — Encounter: Payer: Self-pay | Admitting: Internal Medicine

## 2024-03-06 ENCOUNTER — Ambulatory Visit: Admitting: Internal Medicine

## 2024-03-06 VITALS — BP 118/86 | HR 54 | Temp 98.6°F | Ht 72.0 in | Wt 235.8 lb

## 2024-03-06 DIAGNOSIS — M5440 Lumbago with sciatica, unspecified side: Secondary | ICD-10-CM | POA: Diagnosis not present

## 2024-03-06 DIAGNOSIS — E785 Hyperlipidemia, unspecified: Secondary | ICD-10-CM | POA: Diagnosis not present

## 2024-03-06 DIAGNOSIS — J4530 Mild persistent asthma, uncomplicated: Secondary | ICD-10-CM

## 2024-03-06 DIAGNOSIS — N320 Bladder-neck obstruction: Secondary | ICD-10-CM

## 2024-03-06 DIAGNOSIS — R0609 Other forms of dyspnea: Secondary | ICD-10-CM | POA: Diagnosis not present

## 2024-03-06 DIAGNOSIS — Z0000 Encounter for general adult medical examination without abnormal findings: Secondary | ICD-10-CM

## 2024-03-06 DIAGNOSIS — G8929 Other chronic pain: Secondary | ICD-10-CM

## 2024-03-06 DIAGNOSIS — I4891 Unspecified atrial fibrillation: Secondary | ICD-10-CM | POA: Diagnosis not present

## 2024-03-06 NOTE — Assessment & Plan Note (Signed)
 Chronic -  Cont on Lipitor Coronary artery calcium  score is 131 2025

## 2024-03-06 NOTE — Patient Instructions (Signed)
 Fingertip Pulse Oximeter Blood Oxygen Saturation Monitor

## 2024-03-06 NOTE — Assessment & Plan Note (Signed)
 Ablation July 2025 (Dr Inocencio) Get a Fingertip Pulse Oximeter Blood Oxygen Saturation Monitor for [employer]

## 2024-03-06 NOTE — Assessment & Plan Note (Signed)
 Recurrent Get a Fingertip Pulse Oximeter Blood Oxygen Saturation Monitor  S/p ablation

## 2024-03-06 NOTE — Progress Notes (Signed)
 Subjective:  Patient ID: Zi Newbury, male    DOB: 03-21-1941  Age: 83 y.o. MRN: [id number]  CC: Medical Management of Chronic Issues (4 Month follow up. Ongoing dyspnea, believed to be form recent ablation. New chronic back pain)   HPI Blade Scheff presents for LBP, DOA, fib  Outpatient Medications Prior to Visit  Medication Sig Dispense Refill   amLODipine-valsartan (EXFORGE) 10-320 MG tablet Take 1 tablet by mouth daily. Take 1 tablet by mouth daily.     apixaban  (ELIQUIS ) 5 MG TABS tablet Take 1 tablet (5 mg total) by mouth 2 (two) times daily. 180 tablet 1   ARIPiprazole (ABILIFY) 10 MG tablet Take 10 mg by mouth daily.     atorvastatin  (LIPITOR) 40 MG tablet TAKE 1 TABLET(40 MG) BY MOUTH DAILY 90 tablet 3   desvenlafaxine  (PRISTIQ ) 100 MG 24 hr tablet Take 100 mg by mouth daily.     famotidine-calcium  carbonate-magnesium hydroxide (PEPCID COMPLETE) 10-800-165 MG chewable tablet Chew 2 tablets by mouth every evening.     finasteride (PROSCAR) 5 MG tablet Take 5 mg by mouth daily.     GEMTESA 75 MG TABS Take 75 mg by mouth every other day.     ibuprofen (ADVIL) 200 MG tablet Take 400 mg by mouth 2 (two) times daily.     Multiple Vitamins-Minerals (PRESERVISION AREDS 2+MULTI VIT PO) Take 1 capsule by mouth 2 (two) times daily.     OMEPRAZOLE  PO Take 20 mg by mouth 2 (two) times daily.     sertraline (ZOLOFT) 100 MG tablet Take 100 mg by mouth daily.     tamsulosin (FLOMAX) 0.4 MG CAPS capsule Take 0.4 mg by mouth daily.     torsemide (DEMADEX) 5 MG tablet Take 5 mg by mouth daily. (Patient not taking: Reported on 03/06/2024)     No facility-administered medications prior to visit.    ROS: Review of Systems  Constitutional:  Positive for fatigue. Negative for appetite change and unexpected weight change.  HENT:  Negative for congestion, nosebleeds, sneezing, sore throat and trouble swallowing.   Eyes:  Negative for itching and visual disturbance.  Respiratory:  Positive for  shortness of breath. Negative for cough.   Cardiovascular:  Negative for chest pain, palpitations and leg swelling.  Gastrointestinal:  Negative for abdominal distention, blood in stool, diarrhea and nausea.  Genitourinary:  Negative for frequency and hematuria.  Musculoskeletal:  Positive for back pain. Negative for gait problem, joint swelling and neck pain.  Skin:  Negative for rash.  Neurological:  Negative for dizziness, tremors, speech difficulty and weakness.  Psychiatric/Behavioral:  Negative for agitation, dysphoric mood and sleep disturbance. The patient is not nervous/anxious.     Objective:  BP 118/86   Pulse (!) 54   Temp 98.6 F (37 C)   Ht 6' (1.829 m)   Wt 235 lb 12.8 oz (107 kg)   SpO2 96%   BMI 31.98 kg/m   BP Readings from Last 3 Encounters:  03/06/24 118/86  01/27/24 138/66  12/30/23 136/76    Wt Readings from Last 3 Encounters:  03/06/24 235 lb 12.8 oz (107 kg)  01/27/24 234 lb 12.8 oz (106.5 kg)  12/30/23 230 lb (104.3 kg)    Physical Exam Constitutional:      General: He is not in acute distress.    Appearance: He is well-developed.     Comments: NAD  Eyes:     Conjunctiva/sclera: Conjunctivae normal.     Pupils: Pupils are equal, round, and  reactive to light.  Neck:     Thyroid : No thyromegaly.     Vascular: No JVD.  Cardiovascular:     Rate and Rhythm: Normal rate and regular rhythm.     Heart sounds: Normal heart sounds. No murmur heard.    No friction rub. No gallop.  Pulmonary:     Effort: Pulmonary effort is normal. No respiratory distress.     Breath sounds: Normal breath sounds. No wheezing or rales.  Chest:     Chest wall: No tenderness.  Abdominal:     General: Bowel sounds are normal. There is no distension.     Palpations: Abdomen is soft. There is no mass.     Tenderness: There is no abdominal tenderness. There is no guarding or rebound.  Musculoskeletal:        General: No tenderness. Normal range of motion.     Cervical  back: Normal range of motion.     Right lower leg: No edema.     Left lower leg: No edema.  Lymphadenopathy:     Cervical: No cervical adenopathy.  Skin:    General: Skin is warm and dry.     Findings: No rash.  Neurological:     Mental Status: He is alert and oriented to person, place, and time.     Cranial Nerves: No cranial nerve deficit.     Motor: No abnormal muscle tone.     Coordination: Coordination normal.     Gait: Gait normal.     Deep Tendon Reflexes: Reflexes are normal and symmetric.  Psychiatric:        Behavior: Behavior normal.        Thought Content: Thought content normal.        Judgment: Judgment normal.     Lab Results  Component Value Date   WBC 10.9 (H) 12/23/2023   HGB 14.4 12/23/2023   HCT 43.7 12/23/2023   PLT 279 12/23/2023   GLUCOSE 87 12/23/2023   CHOL 165 02/12/2021   TRIG 165.0 (H) 02/12/2021   HDL 56.40 02/12/2021   LDLDIRECT 78.0 07/17/2020   LDLCALC 76 02/12/2021   ALT 32 08/05/2021   AST 25 08/05/2021   NA 141 12/23/2023   K 4.5 12/23/2023   CL 101 12/23/2023   CREATININE 0.83 12/23/2023   BUN 14 12/23/2023   CO2 21 12/23/2023   TSH 1.87 08/05/2021   PSA 0.75 07/17/2020   HGBA1C 5.8 05/11/2019    No results found.  Assessment & Plan:   Problem List Items Addressed This Visit     Atrial fibrillation [facility])   Ablation July 2025 (Dr Inocencio) Get a Fingertip Pulse Oximeter Blood Oxygen Saturation Monitor for occ DOE       Relevant Orders   Urinalysis   Chronic asthma, mild persistent, uncomplicated   Get a Fingertip Pulse Oximeter Blood Oxygen Saturation Monitor       DOE (dyspnea on exertion) - Primary   Recurrent Get a Fingertip Pulse Oximeter Blood Oxygen Saturation Monitor  S/p ablation      Relevant Orders   TSH   CBC with Differential/Platelet   PSA   Dyslipidemia   Chronic -  Cont on Lipitor Coronary artery calcium  score is 131 2025      Low back pain   F/u w/Dr Bean LBP, RLE pain Epidural inj is  pending at [facility]      Well adult exam   Relevant Orders   TSH   Urinalysis   CBC  with Differential/Platelet   Lipid panel   PSA   Comprehensive metabolic panel with GFR   Other Visit Diagnoses       Bladder neck obstruction       Relevant Orders   PSA         No orders of the defined types were placed in this encounter.     Follow-up: Return in about 6 months (around 09/04/2024) for Wellness Exam.  Marolyn Noel, MD

## 2024-03-06 NOTE — Assessment & Plan Note (Signed)
 Get a Fingertip Pulse Oximeter Blood Oxygen Saturation Monitor

## 2024-03-06 NOTE — Assessment & Plan Note (Signed)
 F/u w/Dr Bean LBP, RLE pain Epidural inj is pending at [facility]

## 2024-03-15 ENCOUNTER — Other Ambulatory Visit: Payer: Self-pay | Admitting: Cardiology

## 2024-03-15 MED ORDER — APIXABAN 5 MG PO TABS: 5.0000 mg | ORAL_TABLET | Freq: Two times a day (BID) | ORAL | 1 refills | Status: DC

## 2024-03-15 MED ORDER — APIXABAN 5 MG PO TABS: 5.0000 mg | ORAL_TABLET | Freq: Two times a day (BID) | ORAL | 1 refills | Status: AC

## 2024-03-15 NOTE — Telephone Encounter (Signed)
 Prescription refill request for Eliquis  received. Indication: AF Last office visit: 01/27/24  JINNY Heinrich PA-C Scr: 0.83 on 12/23/23  Epic Age: 83 Weight: 106.5kg  Based on above findings Eliquis  5mg  twice daily is the appropriate dose.  Refill approved.

## 2024-03-15 NOTE — Addendum Note (Signed)
 Addended by: ROBYNN OLAM HERO on: 03/15/2024 07:53 AM   Modules accepted: Orders

## 2024-03-21 DIAGNOSIS — M5416 Radiculopathy, lumbar region: Secondary | ICD-10-CM | POA: Diagnosis not present

## 2024-03-30 ENCOUNTER — Ambulatory Visit (HOSPITAL_COMMUNITY)
Admission: RE | Admit: 2024-03-30 | Discharge: 2024-03-30 | Disposition: A | Discharge status: Home / Self Care | Source: Ambulatory Visit | Attending: Internal Medicine | Admitting: Internal Medicine

## 2024-03-30 ENCOUNTER — Encounter (HOSPITAL_COMMUNITY): Payer: Self-pay | Admitting: Internal Medicine

## 2024-03-30 VITALS — BP 130/84 | HR 59 | Ht 72.0 in | Wt 237.8 lb

## 2024-03-30 DIAGNOSIS — D6869 Other thrombophilia: Secondary | ICD-10-CM | POA: Diagnosis not present

## 2024-03-30 DIAGNOSIS — I4819 Other persistent atrial fibrillation: Secondary | ICD-10-CM | POA: Diagnosis not present

## 2024-03-30 NOTE — Progress Notes (Signed)
 Primary Care Physician: Garald Karlynn GAILS, MD Primary Cardiologist: Norleen CHRISTELLA Blare, MD Electrophysiologist: Will Gladis Norton, MD     Referring Physician: Dr. Norton Oscar [name] is a 83 y.o. male with a history of HTN, HLD, and atrial fibrillation who presents for consultation in the [facility]. Patient is on Eliquis  for a CHADS2VASC score of 3. S/p Afib ablation on 12/30/23 by Dr. Norton.   On follow up 03/30/24, patient is currently in NSR. He has had overall no Afib burden since last office visit. No bleeding issues on Eliquis . He still notes intermittent SOB with activity and while at rest which is not improved.  Today, he denies symptoms of PND, lower extremity edema, dizziness, presyncope, syncope, bleeding, or neurologic sequela. The patient is tolerating medications without difficulties and is otherwise without complaint today.    he has a BMI of Body mass index is 32.25 kg/m.SABRA Filed Weights   03/30/24 1500  Weight: 107.9 kg     Current Outpatient Medications  Medication Sig Dispense Refill   amLODipine-valsartan (EXFORGE) 10-320 MG tablet Take 1 tablet by mouth daily. Take 1 tablet by mouth daily.     apixaban  (ELIQUIS ) 5 MG TABS tablet Take 1 tablet (5 mg total) by mouth 2 (two) times daily. 180 tablet 1   ARIPiprazole (ABILIFY) 10 MG tablet Take 10 mg by mouth daily.     atorvastatin  (LIPITOR) 40 MG tablet TAKE 1 TABLET(40 MG) BY MOUTH DAILY 90 tablet 3   desvenlafaxine  (PRISTIQ ) 100 MG 24 hr tablet Take 100 mg by mouth daily.     famotidine-calcium  carbonate-magnesium hydroxide (PEPCID COMPLETE) 10-800-165 MG chewable tablet Chew 2 tablets by mouth every evening.     finasteride (PROSCAR) 5 MG tablet Take 5 mg by mouth daily.     GEMTESA 75 MG TABS Take 75 mg by mouth every other day.     ibuprofen (ADVIL) 200 MG tablet Take 400 mg by mouth 2 (two) times daily.     Multiple Vitamins-Minerals (PRESERVISION AREDS 2+MULTI VIT PO)  Take 1 capsule by mouth 2 (two) times daily.     OMEPRAZOLE  PO Take 20 mg by mouth 2 (two) times daily.     sertraline (ZOLOFT) 100 MG tablet Take 100 mg by mouth daily.     tamsulosin (FLOMAX) 0.4 MG CAPS capsule Take 0.4 mg by mouth daily.     torsemide (DEMADEX) 5 MG tablet Take 5 mg by mouth daily.     No current facility-administered medications for this encounter.    Atrial Fibrillation Management history:  Previous antiarrhythmic drugs: none Previous cardioversions:  Previous ablations: 12/30/23 Anticoagulation history: Eliquis    ROS- All systems are reviewed and negative except as per the HPI above.  Physical Exam: BP 130/84   Pulse (!) 59   Ht 6' (1.829 m)   Wt 107.9 kg   BMI 32.25 kg/m   GEN- The patient is well appearing, alert and oriented x 3 today.   Neck - no JVD or carotid bruit noted Lungs- Clear to ausculation bilaterally, normal work of breathing Heart- Regular rate and rhythm, no murmurs, rubs or gallops, PMI not laterally displaced Extremities- no clubbing, cyanosis, or edema Skin - no rash or ecchymosis noted   EKG today demonstrates  Vent. rate 59 BPM PR interval 216 ms QRS duration 128 ms QT/QTcB 446/441 ms P-R-T axes 23 -40 77 Sinus bradycardia with sinus arrhythmia with 1st degree A-V block Left axis deviation Left  ventricular hypertrophy with QRS widening and repolarization abnormality ( R in aVL , Cornell product ) Abnormal ECG When compared with ECG of 27-Jan-2024 13:59, No significant change was found   ASSESSMENT & PLAN CHA2DS2-VASc Score = 3  The patient's score is based upon: CHF History: 0 HTN History: 1 Diabetes History: 0 Stroke History: 0 Vascular Disease History: 0 Age Score: 2 Gender Score: 0       ASSESSMENT AND PLAN: Persistent Atrial Fibrillation (ICD10:  I48.19) The patient's CHA2DS2-VASc score is 3, indicating a 3.2% annual risk of stroke.   S/p Afib ablation on 12/30/23 by Dr. Inocencio.  Patient is currently in  NSR. Continue with current management without change.  Secondary Hypercoagulable State (ICD10:  D68.69) The patient is at significant risk for stroke/thromboembolism based upon his CHA2DS2-VASc Score of 3.  Continue Apixaban  (Eliquis ).  Continue Eliquis .     Follow up with Dr. Inocencio in 6 months.    [facility], [facility]  [facility] [address] [place], [place] [address] [phone]

## 2024-04-07 ENCOUNTER — Encounter: Payer: Self-pay | Admitting: Pharmacist

## 2024-04-07 NOTE — Progress Notes (Signed)
 Pharmacy Quality Measure Review  This patient is appearing on a report for being at risk of failing the adherence measure for hypertension (ACEi/ARB) medications this calendar year.   Medication: Amlodipine/valsartan Last fill date: 03/24/24 for 60 day supply  Insurance report was not up to date. No action needed at this time.   Darrelyn Drum, PharmD, BCPS, CPP Clinical Pharmacist Practitioner [facility] [phone]

## 2024-05-21 NOTE — Progress Notes (Unsigned)
 "     Oscar Console, PA-C [address] [place], [place]  [address] Phone: [phone]   Primary Care Physician: Garald Karlynn GAILS, MD  Primary Gastroenterologist:  Oscar Console, PA-C / Elspeth Naval, MD   Chief Complaint: Discuss colonoscopy and EGD.       HPI:   Discussed the use of AI scribe software for clinical note transcription with the patient, who gave verbal consent to proceed.  I last saw him 12/01/2023 for personal history of colon polyps and Barrett's esophagus.  He was scheduled to have cardiac ablation 12/30/2023 for persistent A-fib, therefore EGD and colonoscopy procedures had to be postponed.  He has currently recovered and is stable from cardiac standpoint.  Currently in normal sinus rhythm.  He is ready to schedule procedures.  07/2022 last EGD by Dr. Aneita: Long segment Barrett's esophagus.  4 cm in length.  Medium hiatal hernia.  Mild gastritis.  10 mm nonbleeding duodenal diverticulum.  Biopsies showed Barrett's with low-grade dysplasia.  Omeprazole  increased to 40 Mg twice daily x 6 months.  Repeat EGD in 6 months to reevaluate for dysplasia (overdue).   09/2018 EGD by Dr. Aneita: Long segment Barrett's esophagus, gastritis, medium hiatal hernia, nonbleeding duodenal diverticulum.  Biopsies confirmed Barrett's but no dysplasia.   09/2018 last colonoscopy: 3 (5 mm to 7 mm) polyps removed.  Mild diverticulosis, internal hemorrhoids.  Pathology showed tubular adenoma and a hyperplastic polyp.  5-year repeat (due 09/2023).  Patient's mother had colon cancer in her 87s.   PMH: Atrial fibrillation, chronic asthma, hypertension.  Currently on Eliquis .  S/p cardiac ablation 12/30/2023 for A-fib.  Currently on Eliquis .  Cardiologist Dr. Jeronimo.   Current symptoms: He denies any current GI symptoms.  He is taking omeprazole  20 Mg twice daily with good control of acid reflux.  He dysphagia, abdominal pain, or gastrointestinal bleeding.  He has a rare episode of breakthrough  acid reflux.  Denies constipation, diarrhea, or rectal bleeding.  He has chronic longstanding shortness of breath for several years.  History of chronic asthma.  Previous ablation procedure and pulmonary evaluation unrevealing.  He denies chest pain.  No known sleep apnea.  Does not use CPAP.  We discussed getting a sleep study and he will talk to his PCP about this.  Overall doing well with no health concerns today.    Current Outpatient Medications  Medication Sig Dispense Refill   amLODipine-valsartan (EXFORGE) 10-320 MG tablet Take 1 tablet by mouth daily. Take 1 tablet by mouth daily.     apixaban  (ELIQUIS ) 5 MG TABS tablet Take 1 tablet (5 mg total) by mouth 2 (two) times daily. 180 tablet 1   ARIPiprazole (ABILIFY) 10 MG tablet Take 10 mg by mouth daily.     atorvastatin  (LIPITOR) 40 MG tablet TAKE 1 TABLET(40 MG) BY MOUTH DAILY 90 tablet 3   desvenlafaxine  (PRISTIQ ) 100 MG 24 hr tablet Take 100 mg by mouth daily.     famotidine-calcium  carbonate-magnesium hydroxide (PEPCID COMPLETE) 10-800-165 MG chewable tablet Chew 2 tablets by mouth every evening.     finasteride (PROSCAR) 5 MG tablet Take 5 mg by mouth daily.     GEMTESA 75 MG TABS Take 75 mg by mouth every other day.     ibuprofen (ADVIL) 200 MG tablet Take 400 mg by mouth 2 (two) times daily.     Na Sulfate-K Sulfate-Mg Sulfate concentrate (SUPREP) 17.5-3.13-1.6 GM/177ML SOLN Take 1 kit (354 mLs total) by mouth once for 1 dose. 354 mL  0   OMEPRAZOLE  PO Take 20 mg by mouth 2 (two) times daily.     sertraline (ZOLOFT) 100 MG tablet Take 100 mg by mouth daily.     tamsulosin (FLOMAX) 0.4 MG CAPS capsule Take 0.4 mg by mouth daily.     torsemide (DEMADEX) 5 MG tablet Take 5 mg by mouth daily.     Multiple Vitamins-Minerals (PRESERVISION AREDS 2+MULTI VIT PO) Take 1 capsule by mouth 2 (two) times daily. (Patient not taking: Reported on 05/22/2024)     No current facility-administered medications for this visit.    Allergies as of  05/22/2024 - Review Complete 05/22/2024  Allergen Reaction Noted   Celecoxib  02/26/2010   Penicillins  02/26/2010   Rofecoxib      Past Medical History:  Diagnosis Date   Adenomatous colon polyp    Allergic rhinitis, cause unspecified    Allergy    Anxiety    Anxiety state, unspecified    Atrial fibrillation (HCC)    Barrett esophagus    Barrett esophagus    BPH (benign prostatic hypertrophy)    BX 2007   Cancer (HCC)    melanoma   Cataract    bilaterally removed   Depression    Depressive disorder, not elsewhere classified    GERD (gastroesophageal reflux disease)    Hypertension    Hypogonadism male    Microhematuria    Neuromuscular disorder (HCC)    small HH 05-26-2012 egd    Other and unspecified hyperlipidemia    Rosacea    UTI (lower urinary tract infection) 2009    Past Surgical History:  Procedure Laterality Date   ATRIAL FIBRILLATION ABLATION N/A 12/30/2023   Procedure: ATRIAL FIBRILLATION ABLATION;  Surgeon: Inocencio Soyla Lunger, MD;  Location: [facility];  Service: Cardiovascular;  Laterality: N/A;   CATARACT EXTRACTION  05/10/2012   left   COLONOSCOPY  04/22/2010   ESOPHAGOGASTRODUODENOSCOPY  05/26/2012   stark   HAND SURGERY Left    heart ablation Left 02/28/2024   MELANOMA EXCISION     POLYPECTOMY     prev colon TA polyps- no polyps in 2011   PROSTATE BIOPSY  2011   Dr Pecola    UPPER GASTROINTESTINAL ENDOSCOPY      Review of Systems:    All systems reviewed and negative except where noted in HPI.    Physical Exam:  BP 126/66   Pulse 67   Ht 6' (1.829 m)   Wt 236 lb 6 oz (107.2 kg)   BMI 32.06 kg/m  No LMP for male patient.  General: Well-nourished, well-developed, obese, in no acute distress.  Walks with normal gait.  No assistive devices.  Gets on and off exam table with no help. Lungs: Clear to auscultation bilaterally. Non-labored. Heart: Regular rate and rhythm, no murmurs rubs or gallops.  He is in normal sinus  rhythm. Abdomen: Bowel sounds are normal; Abdomen is Soft; No hepatosplenomegaly, masses or hernias;  No Abdominal Tenderness; No guarding or rebound tenderness. Neuro: Alert and oriented x 3.  Grossly intact.  Psych: Alert and cooperative, normal mood and affect.   Imaging Studies: No results found.  Labs: CBC    Component Value Date/Time   WBC 10.9 (H) 12/23/2023 0951   WBC 9.2 08/05/2021 1054   RBC 4.68 12/23/2023 0951   RBC 4.61 08/05/2021 1054   HGB 14.4 12/23/2023 0951   HCT 43.7 12/23/2023 0951   PLT 279 12/23/2023 0951   MCV 93 12/23/2023 0951  MCH 30.8 12/23/2023 0951   MCHC 33.0 12/23/2023 0951   MCHC 33.6 08/05/2021 1054   RDW 13.0 12/23/2023 0951   LYMPHSABS 2.3 08/05/2021 1054   MONOABS 0.8 08/05/2021 1054   EOSABS 0.2 08/05/2021 1054   BASOSABS 0.0 08/05/2021 1054    CMP     Component Value Date/Time   NA 141 12/23/2023 0951   K 4.5 12/23/2023 0951   CL 101 12/23/2023 0951   CO2 21 12/23/2023 0951   GLUCOSE 87 12/23/2023 0951   GLUCOSE 88 08/05/2021 1054   BUN 14 12/23/2023 0951   CREATININE 0.83 12/23/2023 0951   CALCIUM  8.8 12/23/2023 0951   PROT 6.9 08/05/2021 1054   ALBUMIN 4.5 08/05/2021 1054   AST 25 08/05/2021 1054   ALT 32 08/05/2021 1054   ALKPHOS 69 08/05/2021 1054   BILITOT 0.7 08/05/2021 1054   GFRNONAA 99.18 02/13/2010 0904   GFRAA 109 11/11/2007 0815    Assessment and Plan:   Oscar [name] is a 83 y.o. y/o male returns for follow-up of:  1.  History of adenomatous colon polyps - Scheduling Colonoscopy I discussed risks of colonoscopy with patient to include risk of bleeding, colon perforation, and risk of sedation.  Patient expressed understanding and agrees to proceed with colonoscopy.   2.  Barrett's esophagus with low-grade dysplasia; chronic GERD -fairly well-controlled on PPI. -Continue omeprazole  20 mg twice daily - Scheduling EGD I discussed risks of EGD with patient to include risk of bleeding, perforation, and risk  of sedation.  Patient expressed understanding and agrees to proceed with EGD.   3.  Comorbidities:  Atrial fibrillation, chronic asthma, hypertension.  Currently on Eliquis .  S/p cardiac ablation 12/30/2023 for A-fib with no recurrence.  Currently in normal sinus rhythm.. - Request cardiac permission to hold Eliquis  2 days prior to procedures from cardiologist Dr. Jeronimo.    Oscar Console, PA-C  Follow up as needed based on procedure results and GI symptoms.   "

## 2024-05-22 ENCOUNTER — Ambulatory Visit: Admitting: Physician Assistant

## 2024-05-22 ENCOUNTER — Encounter: Payer: Self-pay | Admitting: Physician Assistant

## 2024-05-22 ENCOUNTER — Telehealth: Payer: Self-pay

## 2024-05-22 VITALS — BP 126/66 | HR 67 | Ht 72.0 in | Wt 236.4 lb

## 2024-05-22 DIAGNOSIS — Z7901 Long term (current) use of anticoagulants: Secondary | ICD-10-CM

## 2024-05-22 DIAGNOSIS — Z860101 Personal history of adenomatous and serrated colon polyps: Secondary | ICD-10-CM | POA: Diagnosis not present

## 2024-05-22 DIAGNOSIS — Z800 Family history of malignant neoplasm of digestive organs: Secondary | ICD-10-CM

## 2024-05-22 DIAGNOSIS — K22710 Barrett's esophagus with low grade dysplasia: Secondary | ICD-10-CM | POA: Diagnosis not present

## 2024-05-22 DIAGNOSIS — K219 Gastro-esophageal reflux disease without esophagitis: Secondary | ICD-10-CM

## 2024-05-22 MED ORDER — NA SULFATE-K SULFATE-MG SULF 17.5-3.13-1.6 GM/177ML PO SOLN: 1.0000 | Freq: Once | ORAL | 0 refills | Status: AC

## 2024-05-22 NOTE — Patient Instructions (Addendum)
 We have sent the following medications to your pharmacy for you to pick up at your convenience: Omeprazole  20 mg twice daily   You have been scheduled for an Endoscopy and Colonoscopy. Please follow the written instructions given to you at your visit today.  If you use inhalers (even only as needed), please bring them with you on the day of your procedure.  DO NOT TAKE 7 DAYS PRIOR TO TEST- Trulicity (dulaglutide) Ozempic, Wegovy (semaglutide) Mounjaro (tirzepatide) Bydureon Bcise (exanatide extended release)  DO NOT TAKE 1 DAY PRIOR TO YOUR TEST Rybelsus (semaglutide) Adlyxin (lixisenatide) Victoza (liraglutide) Byetta (exanatide) ___________________________________________________________________________  Please follow up sooner if symptoms increase or worsen __________________________________________________________________________  Due to recent changes in healthcare laws, you may see the results of your imaging and laboratory studies on MyChart before your provider has had a chance to review them.  We understand that in some cases there may be results that are confusing or concerning to you. Not all laboratory results come back in the same time frame and the provider may be waiting for multiple results in order to interpret others.  Please give us  48 hours in order for your provider to thoroughly review all the results before contacting the office for clarification of your results.   Thank you for trusting me with your gastrointestinal care!   Ellouise Console, PA-C _______________________________________________________  If your blood pressure at your visit was 140/90 or greater, please contact your primary care physician to follow up on this.  _______________________________________________________  If you are age 67 or older, your body mass index should be between 23-30. Your Body mass index is 32.06 kg/m. If this is out of the aforementioned range listed, please consider follow  up with your Primary Care Provider.  If you are age 59 or younger, your body mass index should be between 19-25. Your Body mass index is 32.06 kg/m. If this is out of the aformentioned range listed, please consider follow up with your Primary Care Provider.   ________________________________________________________  The [facility] providers would like to encourage you to use MYCHART to communicate with providers for non-urgent requests or questions.  Due to long hold times on the telephone, sending your provider a message by [facility] may be a faster and more efficient way to get a response.  Please allow 48 business hours for a response.  Please remember that this is for non-urgent requests.  _______________________________________________________

## 2024-05-22 NOTE — Telephone Encounter (Signed)
 Pharmacy please advise on holding Eliquis  prior to colonoscopy and EDG with last labs 03/06/2024, scheduled for 07/06/2024. Thank you.

## 2024-05-22 NOTE — Progress Notes (Signed)
 Agree with assessment and plan as outlined.

## 2024-05-22 NOTE — Telephone Encounter (Signed)
 [facility] Pre-operative Risk Assessment     Request for surgical clearance:     Endoscopy Procedure  What type of surgery is being performed?     Endoscopy and Colonoscopy  When is this surgery scheduled?     07/06/24  What type of clearance is required ?   Pharmacy  Are there any medications that need to be held prior to surgery and how long? Eliquis  2 days   Practice name and name of physician performing surgery?      [facility]  What is your office phone and fax number?      Phone- [phone]  Fax- [phone]  Anesthesia type (None, local, MAC, general) ?       MAC   Please route your response to Alethea Blocker, CMA

## 2024-05-22 NOTE — Telephone Encounter (Signed)
"  ° °  Patient Name: Oscar [name]  DOB: 1940-11-04 MRN: [id number]  Primary Cardiologist: Norleen CHRISTELLA Blare, MD  Clinical pharmacists have reviewed the patient's past medical history, labs, and current medications as part of preoperative protocol coverage. The following recommendations have been made:  Patient with diagnosis of atrial fibrillation on Eliquis  for anticoagulation.     What type of surgery is being performed?     Endoscopy and Colonoscopy  When is this surgery scheduled?     07/06/24      CHA2DS2-VASc Score = 3   This indicates a 3.2% annual risk of stroke. The patient's score is based upon: CHF History: 0 HTN History: 1 Diabetes History: 0 Stroke History: 0 Vascular Disease History: 0 Age Score: 2 Gender Score: 0     CrCl 104 Platelet count 279   Patient has not had an Afib/aflutter ablation in the last 3 months, DCCV within the last 4 weeks or a watchman implanted in the last 45 days    Per office protocol, patient can hold Eliquis  for 2 days prior to procedure.   Patient will not need bridging with Lovenox (enoxaparin) around procedure.       I will route this recommendation to the requesting party via Epic fax function and remove from pre-op pool.  Please call with questions.  Lamarr Satterfield, NP 05/22/2024, 3:03 PM  "

## 2024-05-22 NOTE — Telephone Encounter (Signed)
 Patient with diagnosis of atrial fibrillation on Eliquis  for anticoagulation.    What type of surgery is being performed?     Endoscopy and Colonoscopy  When is this surgery scheduled?     07/06/24    CHA2DS2-VASc Score = 3   This indicates a 3.2% annual risk of stroke. The patient's score is based upon: CHF History: 0 HTN History: 1 Diabetes History: 0 Stroke History: 0 Vascular Disease History: 0 Age Score: 2 Gender Score: 0    CrCl 104 Platelet count 279  Patient has not had an Afib/aflutter ablation in the last 3 months, DCCV within the last 4 weeks or a watchman implanted in the last 45 days   Per office protocol, patient can hold Eliquis  for 2 days prior to procedure.   Patient will not need bridging with Lovenox (enoxaparin) around procedure.  **This guidance is not considered finalized until pre-operative APP has relayed final recommendations.**

## 2024-06-02 NOTE — Telephone Encounter (Signed)
 Spoke to patient and told him that, per cardiology, he could hold his Eliquis for 2 days prior to his procedure.  Patient agreed

## 2024-06-28 ENCOUNTER — Encounter: Payer: Self-pay | Admitting: Gastroenterology

## 2024-07-06 ENCOUNTER — Encounter: Admitting: Gastroenterology

## 2024-07-18 ENCOUNTER — Ambulatory Visit

## 2024-09-04 ENCOUNTER — Ambulatory Visit: Admitting: Internal Medicine

## 2025-02-05 ENCOUNTER — Encounter (INDEPENDENT_AMBULATORY_CARE_PROVIDER_SITE_OTHER): Admitting: Ophthalmology
